# Patient Record
Sex: Female | Born: 1945
Health system: Southern US, Community
[De-identification: ages and names within clinical notes are randomized; demographics above are authoritative.]

## PROBLEM LIST (undated history)

## (undated) DIAGNOSIS — G4730 Sleep apnea, unspecified: Secondary | ICD-10-CM

## (undated) DIAGNOSIS — K2270 Barrett's esophagus without dysplasia: Secondary | ICD-10-CM

## (undated) DIAGNOSIS — E7800 Pure hypercholesterolemia, unspecified: Secondary | ICD-10-CM

## (undated) DIAGNOSIS — H269 Unspecified cataract: Secondary | ICD-10-CM

## (undated) DIAGNOSIS — F419 Anxiety disorder, unspecified: Secondary | ICD-10-CM

## (undated) DIAGNOSIS — K219 Gastro-esophageal reflux disease without esophagitis: Secondary | ICD-10-CM

## (undated) DIAGNOSIS — C50919 Malignant neoplasm of unspecified site of unspecified female breast: Secondary | ICD-10-CM

## (undated) DIAGNOSIS — E039 Hypothyroidism, unspecified: Secondary | ICD-10-CM

## (undated) DIAGNOSIS — R1310 Dysphagia, unspecified: Secondary | ICD-10-CM

## (undated) HISTORY — DX: Anxiety disorder, unspecified: F41.9

## (undated) HISTORY — PX: AUGMENTATION MAMMAPLASTY: SUR837

## (undated) HISTORY — DX: Pure hypercholesterolemia, unspecified: E78.00

## (undated) HISTORY — DX: Sleep apnea, unspecified: G47.30

## (undated) HISTORY — DX: Unspecified cataract: H26.9

## (undated) HISTORY — DX: Gastro-esophageal reflux disease without esophagitis: K21.9

## (undated) HISTORY — PX: BREAST SURGERY: SHX581

---

## 2004-06-13 ENCOUNTER — Ambulatory Visit: Payer: Self-pay | Admitting: Family Medicine

## 2005-06-19 ENCOUNTER — Ambulatory Visit: Payer: Self-pay | Admitting: Family Medicine

## 2006-02-24 ENCOUNTER — Ambulatory Visit: Payer: Self-pay | Admitting: General Surgery

## 2006-03-13 ENCOUNTER — Ambulatory Visit: Payer: Self-pay | Admitting: General Surgery

## 2006-07-14 ENCOUNTER — Ambulatory Visit: Payer: Self-pay

## 2007-07-20 ENCOUNTER — Ambulatory Visit: Payer: Self-pay | Admitting: Family Medicine

## 2008-07-20 ENCOUNTER — Ambulatory Visit: Payer: Self-pay | Admitting: Family Medicine

## 2009-07-23 ENCOUNTER — Ambulatory Visit: Payer: Self-pay | Admitting: Family Medicine

## 2009-08-10 ENCOUNTER — Ambulatory Visit: Payer: Self-pay | Admitting: Family Medicine

## 2009-08-28 ENCOUNTER — Ambulatory Visit: Payer: Self-pay | Admitting: General Surgery

## 2009-08-30 ENCOUNTER — Ambulatory Visit: Payer: Self-pay | Admitting: General Surgery

## 2009-09-01 HISTORY — PX: MASTECTOMY: SHX3

## 2009-09-01 HISTORY — PX: TONSILLECTOMY: SUR1361

## 2010-03-20 LAB — HM PAP SMEAR: HM PAP: NEGATIVE

## 2012-03-03 ENCOUNTER — Ambulatory Visit: Payer: Self-pay | Admitting: Ophthalmology

## 2012-04-01 ENCOUNTER — Other Ambulatory Visit: Payer: Self-pay | Admitting: Ophthalmology

## 2013-02-14 ENCOUNTER — Ambulatory Visit: Payer: Self-pay | Admitting: Family Medicine

## 2013-02-14 LAB — HM DEXA SCAN

## 2014-05-02 ENCOUNTER — Ambulatory Visit (INDEPENDENT_AMBULATORY_CARE_PROVIDER_SITE_OTHER): Payer: BC Managed Care – PPO | Admitting: Podiatry

## 2014-05-02 ENCOUNTER — Ambulatory Visit: Payer: Self-pay | Admitting: Podiatry

## 2014-05-02 ENCOUNTER — Encounter: Payer: Self-pay | Admitting: Podiatry

## 2014-05-02 ENCOUNTER — Ambulatory Visit (INDEPENDENT_AMBULATORY_CARE_PROVIDER_SITE_OTHER): Payer: BC Managed Care – PPO

## 2014-05-02 VITALS — BP 117/69 | HR 67 | Resp 16 | Ht 64.0 in | Wt 130.0 lb

## 2014-05-02 DIAGNOSIS — M722 Plantar fascial fibromatosis: Secondary | ICD-10-CM

## 2014-05-02 MED ORDER — TRIAMCINOLONE ACETONIDE 10 MG/ML IJ SUSP
10.0000 mg | Freq: Once | INTRAMUSCULAR | Status: AC
  Administered 2014-05-02: 10 mg

## 2014-05-02 NOTE — Progress Notes (Signed)
   Subjective:    Patient ID: Kristen Hays, female    DOB: 31-Mar-1946, 68 y.o.   MRN: [id number]  HPI Comments: Left plantar heel pain for about a month now, real hard to walk on it especially in the mornings. It has got worse it feels like icy hot that runs up the leg   Foot Pain      Review of Systems  All other systems reviewed and are negative.      Objective:   Physical Exam        Assessment & Plan:

## 2014-05-02 NOTE — Progress Notes (Signed)
Subjective:     Patient ID: Kristen Hays, female   DOB: 30-Dec-1945, 68 y.o.   MRN: [id number]  Foot Pain   patient states I'm having a lot of pain in my left heel for about a month and it's very difficult to walk on the mornings and it's been getting gradually worse and I also did some pain on the outside from walking differently   Review of Systems  All other systems reviewed and are negative.      Objective:   Physical Exam  Nursing note and vitals reviewed. Constitutional: She is oriented to person, place, and time.  Cardiovascular: Intact distal pulses.   Musculoskeletal: Normal range of motion.  Neurological: She is oriented to person, place, and time.  Skin: Skin is warm.   neurovascular status intact with muscle strength adequate and range of motion of the subtalar and midtarsal joint within normal limits. Patient's found to have moderate equinus is well oriented x3 and has good digital perfusion. There is significant discomfort in the plantar heel left at the insertion of the tendon into the calcaneus     Assessment:     Acute plantar fasciitis left heel at the insertion into the calcaneus    Plan:     H&P and x-ray reviewed. Injected the left plantar fashion 3 mg Kenalog 5 mg Xylocaine Marcaine mixture and dispensed fascial brace with instructions on usage. Placed on oral anti-inflammatory and reappoint her recheck in one week

## 2014-05-02 NOTE — Patient Instructions (Signed)

## 2014-05-09 ENCOUNTER — Ambulatory Visit (INDEPENDENT_AMBULATORY_CARE_PROVIDER_SITE_OTHER): Payer: BC Managed Care – PPO | Admitting: Podiatry

## 2014-05-09 VITALS — BP 131/70 | HR 65 | Resp 16

## 2014-05-09 DIAGNOSIS — M722 Plantar fascial fibromatosis: Secondary | ICD-10-CM

## 2014-05-09 MED ORDER — TRIAMCINOLONE ACETONIDE 10 MG/ML IJ SUSP: 10.0000 mg | Freq: Once | INTRAMUSCULAR | Status: DC

## 2014-05-09 NOTE — Progress Notes (Signed)
Subjective:     Patient ID: Kristen Hays, female   DOB: 30-Apr-1946, 68 y.o.   MRN: [id number]  HPI patient presents stating that I am feeling some better but still having pain in my heel   Review of Systems     Objective:   Physical Exam Neurovascular status intact with continued discomfort in the plantar heel that has improved some from previous visit    Assessment:     Continue plan her fasciitis left heel medial band    Plan:     Advised on physical therapy and the continuation of conservative care and reinjected the plantar fascia 3 mg Kenalog 5 mg Xylocaine and instructed on physical therapy

## 2014-08-01 HISTORY — PX: EYE SURGERY: SHX253

## 2014-08-07 ENCOUNTER — Ambulatory Visit: Payer: Self-pay | Admitting: Family Medicine

## 2014-08-14 ENCOUNTER — Ambulatory Visit: Payer: Self-pay | Admitting: Ophthalmology

## 2014-08-29 ENCOUNTER — Ambulatory Visit: Payer: Self-pay | Admitting: Ophthalmology

## 2014-09-01 DIAGNOSIS — K2270 Barrett's esophagus without dysplasia: Secondary | ICD-10-CM

## 2014-09-01 HISTORY — DX: Barrett's esophagus without dysplasia: K22.70

## 2014-10-24 ENCOUNTER — Ambulatory Visit: Payer: Self-pay | Admitting: Family Medicine

## 2014-11-06 ENCOUNTER — Ambulatory Visit: Payer: Self-pay | Admitting: Unknown Physician Specialty

## 2014-11-10 ENCOUNTER — Ambulatory Visit: Payer: Self-pay | Admitting: Unknown Physician Specialty

## 2014-12-15 ENCOUNTER — Other Ambulatory Visit: Payer: Self-pay | Admitting: Unknown Physician Specialty

## 2014-12-15 DIAGNOSIS — D440 Neoplasm of uncertain behavior of thyroid gland: Secondary | ICD-10-CM

## 2014-12-24 NOTE — Op Note (Signed)
PATIENT NAME:  Kristen Hays, Kristen Hays MR#:  [id number] DATE OF BIRTH:  September 26, 1945  DATE OF PROCEDURE:  03/03/2012  PROCEDURES PERFORMED:  1. Pars plana vitrectomy of the right eye.  2. Internal limiting membrane peel of the right eye.  3. Gas exchange of the right eye.  4. Endolaser, right eye.   PREOPERATIVE DIAGNOSES:  1. Epiretinal membrane of the right eye.  2. Retinal edema of the right eye.  3. Old retinal tear.  POSTOPERATIVE DIAGNOSES:  1. Epiretinal membrane of the right eye.  2. Macular hole right eye.  3. Retinal tear right eye.   SURGEON: Coralee Rud, M.D.   ANESTHESIA: Retrobulbar block of the right eye with monitored anesthesia care.   COMPLICATIONS: None.   INDICATIONS FOR PROCEDURE: This is a patient who presented to my office with slowly decreasing vision in the right eye. Examination revealed an epiretinal membrane and lamellar hole in the right eye. Examination also revealed two old retinal tears, status post laser. The risks, benefits, and alternatives of the above procedure were discussed and the patient wished to proceed.   DETAILS OF PROCEDURE: After informed consent was obtained, the patient was brought to the operative suite at [facility]. The patient was placed in the supine position, was given a small dose of alfentanil, and a retrobulbar block was performed on the right eye by the primary surgeon without complications. The right eye was prepped and draped in sterile manner. After a lid speculum was inserted, a 25-gauge trocar was placed inferotemporally through displaced conjunctiva in an oblique fashion 4 mm beyond the limbus. The infusion cannula was turned on and inserted through the trocar and secured in position with Steri-Strips. Two more trocars were placed in a similar fashion superotemporally and superonasally. The vitreous cutter and light pipe were introduced in the eye and a core vitrectomy was performed. During the course of  the vitrectomy, the vitreous face elevated off of two areas of the retina creating operculated holes. The peripheral vitreous was trimmed for 360 degrees. Endolaser was introduced and 3 to 4 rows of laser were placed around each of these holes. Indocyanine green was injected onto the posterior pole and then removed within 30 seconds. The magnification lens was placed onto the eye and examination revealed that the lamellar hole had developed a very small full thickness hole in the outer segments. The epiretinal membrane was peeled and the internal limiting membrane was peeled for 360 degrees around the tear for 2 to 2.5 disk diameters around the hole. Air-fluid exchange was performed. Scleral depressed exam was performed for 360 degrees and no further tears or breaks could be identified. All tears were noted to have at least four rows of laser around them. A complete air-fluid exchange was performed. Four minutes was allowed to elapse and then any remnant fluid from the hydrated? vitreous base was removed. 22% SF6 was used as an Acupuncturist and the trocars were removed. The pressure in the eye was confirmed to be approximately 15 mmHg and the wounds were airtight. 5 mg of dexamethasone was given into the inferior fornix and the lid speculum was removed. The eye was cleaned and TobraDex was placed on the eye. A patch and shield were placed over the eye and the patient was taken to postanesthesia care with instructions to remain face down.      ____________________________ Teresa Pelton. Starling Manns, MD mfa:bjt D: 03/03/2012 09:06:12 ET T: 03/03/2012 09:34:42 ET JOB#: [id number]  cc: Teresa Pelton.  Starling Manns, MD, <Dictator> Coralee Rud MD ELECTRONICALLY SIGNED 03/15/2012 17:41

## 2014-12-27 NOTE — Op Note (Signed)
PATIENT NAME:  Kristen Hays, Kristen Hays MR#:  [id number] DATE OF BIRTH:  October 11, 1945  DATE OF PROCEDURE:  08/29/2014  PREOPERATIVE DIAGNOSIS:  Nuclear sclerotic cataract of the right eye.   POSTOPERATIVE DIAGNOSIS:  Nuclear sclerotic cataract of the right eye.   OPERATIVE PROCEDURE:  Cataract extraction by phacoemulsification with implant of intraocular lens to right eye.   SURGEON:  Birder Robson, MD.   ANESTHESIA:  1. Managed anesthesia care.  2. Topical tetracaine drops followed by 2% Xylocaine jelly applied in the preoperative holding area.   COMPLICATIONS:  None.   TECHNIQUE:  Stop and chop.    DESCRIPTION OF PROCEDURE:  The patient was examined and consented in the preoperative holding area where the aforementioned topical anesthesia was applied to the right eye and then brought back to the Operating Room where the right eye was prepped and draped in the usual sterile ophthalmic fashion and a lid speculum was placed. A paracentesis was created with the side port blade and the anterior chamber was filled with viscoelastic. A near clear corneal incision was performed with the steel keratome. A continuous curvilinear capsulorrhexis was performed with a cystotome followed by the capsulorrhexis forceps. Hydrodissection and hydrodelineation were carried out with BSS on a blunt cannula. The lens was removed in a stop and chop technique and the remaining cortical material was removed with the irrigation-aspiration handpiece. The capsular bag was inflated with viscoelastic and the Tecnis ZCB00 20.5-diopter lens, serial number [id number] was placed in the capsular bag without complication. The remaining viscoelastic was removed from the eye with the irrigation-aspiration handpiece. The wounds were hydrated. The anterior chamber was flushed with Miostat and the eye was inflated to physiologic pressure. 0.1 mL of cefuroxime concentration 10 mg/mL was placed in the anterior chamber. The wounds were found to be  water tight. The eye was dressed with Vigamox. The patient was given protective glasses to wear throughout the day and a shield with which to sleep tonight. The patient was also given drops with which to begin a drop regimen today and will follow-up with me in one day.   Please note that VisionBlue dye was used to stain the anterior capsule as this was a nearly white cataract with a very poor red reflex.    ____________________________ Livingston Diones Shandrell Boda, MD wlp:bu D: 08/29/2014 13:56:48 ET T: 08/29/2014 14:58:37 ET JOB#: [id number]  cc: Maddalynn Barnard L. Shiara Mcgough, MD, <Dictator> Livingston Diones Yaminah Clayborn MD ELECTRONICALLY SIGNED 09/04/2014 9:19

## 2014-12-28 DIAGNOSIS — R140 Abdominal distension (gaseous): Secondary | ICD-10-CM | POA: Insufficient documentation

## 2015-01-01 ENCOUNTER — Other Ambulatory Visit: Payer: Self-pay | Admitting: Nurse Practitioner

## 2015-01-01 DIAGNOSIS — R1031 Right lower quadrant pain: Secondary | ICD-10-CM

## 2015-01-01 DIAGNOSIS — R140 Abdominal distension (gaseous): Secondary | ICD-10-CM

## 2015-01-01 DIAGNOSIS — R1032 Left lower quadrant pain: Secondary | ICD-10-CM

## 2015-01-05 ENCOUNTER — Ambulatory Visit: Payer: BLUE CROSS/BLUE SHIELD

## 2015-01-05 ENCOUNTER — Ambulatory Visit
Admission: RE | Admit: 2015-01-05 | Discharge: 2015-01-05 | Disposition: A | Discharge status: Home / Self Care | Payer: BLUE CROSS/BLUE SHIELD | Source: Ambulatory Visit | Attending: Nurse Practitioner | Admitting: Nurse Practitioner

## 2015-01-05 DIAGNOSIS — R1031 Right lower quadrant pain: Secondary | ICD-10-CM | POA: Diagnosis present

## 2015-01-05 DIAGNOSIS — R935 Abnormal findings on diagnostic imaging of other abdominal regions, including retroperitoneum: Secondary | ICD-10-CM | POA: Diagnosis not present

## 2015-01-05 DIAGNOSIS — R1032 Left lower quadrant pain: Secondary | ICD-10-CM

## 2015-01-05 DIAGNOSIS — R140 Abdominal distension (gaseous): Secondary | ICD-10-CM | POA: Diagnosis not present

## 2015-01-05 LAB — BASIC METABOLIC PANEL
BUN: 17 mg/dL (ref 4–21)
Creatinine: 0.7 mg/dL (ref 0.5–1.1)
Glucose: 92 mg/dL
POTASSIUM: 4.3 mmol/L (ref 3.4–5.3)
SODIUM: 142 mmol/L (ref 137–147)

## 2015-01-05 LAB — CBC AND DIFFERENTIAL
HEMATOCRIT: 39 % (ref 36–46)
Hemoglobin: 13.3 g/dL (ref 12.0–16.0)
Platelets: 249 10*3/uL (ref 150–399)
WBC: 5.5 10^3/mL

## 2015-01-05 LAB — LIPID PANEL
CHOLESTEROL: 153 mg/dL (ref 0–200)
HDL: 61 mg/dL (ref 35–70)
LDL CALC: 78 mg/dL
TRIGLYCERIDES: 70 mg/dL (ref 40–160)

## 2015-01-05 LAB — HEPATIC FUNCTION PANEL
ALT: 14 U/L (ref 7–35)
AST: 10 U/L — AB (ref 13–35)

## 2015-01-16 ENCOUNTER — Encounter: Payer: Self-pay | Admitting: *Deleted

## 2015-01-17 ENCOUNTER — Encounter: Payer: Self-pay | Admitting: *Deleted

## 2015-01-17 ENCOUNTER — Ambulatory Visit: Payer: BLUE CROSS/BLUE SHIELD | Admitting: Anesthesiology

## 2015-01-17 ENCOUNTER — Encounter
Admission: RE | Disposition: A | Discharge status: Home / Self Care | Payer: Self-pay | Source: Ambulatory Visit | Attending: Unknown Physician Specialty

## 2015-01-17 ENCOUNTER — Ambulatory Visit
Admission: RE | Admit: 2015-01-17 | Discharge: 2015-01-17 | Disposition: A | Discharge status: Home / Self Care | Payer: BLUE CROSS/BLUE SHIELD | Source: Ambulatory Visit | Attending: Unknown Physician Specialty | Admitting: Unknown Physician Specialty

## 2015-01-17 DIAGNOSIS — D123 Benign neoplasm of transverse colon: Secondary | ICD-10-CM | POA: Diagnosis not present

## 2015-01-17 DIAGNOSIS — R1310 Dysphagia, unspecified: Secondary | ICD-10-CM | POA: Insufficient documentation

## 2015-01-17 DIAGNOSIS — Z79899 Other long term (current) drug therapy: Secondary | ICD-10-CM | POA: Diagnosis not present

## 2015-01-17 DIAGNOSIS — D120 Benign neoplasm of cecum: Secondary | ICD-10-CM | POA: Diagnosis not present

## 2015-01-17 DIAGNOSIS — K5730 Diverticulosis of large intestine without perforation or abscess without bleeding: Secondary | ICD-10-CM | POA: Diagnosis not present

## 2015-01-17 DIAGNOSIS — E780 Pure hypercholesterolemia: Secondary | ICD-10-CM | POA: Diagnosis not present

## 2015-01-17 DIAGNOSIS — R1031 Right lower quadrant pain: Secondary | ICD-10-CM | POA: Insufficient documentation

## 2015-01-17 DIAGNOSIS — K640 First degree hemorrhoids: Secondary | ICD-10-CM | POA: Diagnosis not present

## 2015-01-17 DIAGNOSIS — R1032 Left lower quadrant pain: Secondary | ICD-10-CM | POA: Diagnosis not present

## 2015-01-17 DIAGNOSIS — D122 Benign neoplasm of ascending colon: Secondary | ICD-10-CM | POA: Diagnosis not present

## 2015-01-17 HISTORY — DX: Dysphagia, unspecified: R13.10

## 2015-01-17 HISTORY — PX: ESOPHAGOGASTRODUODENOSCOPY: SHX5428

## 2015-01-17 HISTORY — PX: COLONOSCOPY: SHX5424

## 2015-01-17 HISTORY — PX: SAVORY DILATION: SHX5439

## 2015-01-17 LAB — HM COLONOSCOPY

## 2015-01-17 SURGERY — COLONOSCOPY
Anesthesia: General

## 2015-01-17 MED ORDER — PROPOFOL 10 MG/ML IV BOLUS
INTRAVENOUS | Status: DC | PRN
  Administered 2015-01-17: 70 mg via INTRAVENOUS
  Administered 2015-01-17 (×2): 30 mg via INTRAVENOUS

## 2015-01-17 MED ORDER — SODIUM CHLORIDE 0.9 % IV SOLN: INTRAVENOUS | Status: DC

## 2015-01-17 MED ORDER — MIDAZOLAM HCL 5 MG/5ML IJ SOLN
INTRAMUSCULAR | Status: DC | PRN
  Administered 2015-01-17: 1 mg via INTRAVENOUS

## 2015-01-17 MED ORDER — LIDOCAINE HCL (CARDIAC) 20 MG/ML IV SOLN
INTRAVENOUS | Status: DC | PRN
  Administered 2015-01-17: 50 mg via INTRAVENOUS

## 2015-01-17 MED ORDER — PROPOFOL INFUSION 10 MG/ML OPTIME
INTRAVENOUS | Status: DC | PRN
Start: 2015-01-17 — End: 2015-01-17
  Administered 2015-01-17: 120 ug/kg/min via INTRAVENOUS

## 2015-01-17 MED ORDER — SODIUM CHLORIDE 0.9 % IV SOLN
INTRAVENOUS | Status: DC
  Administered 2015-01-17: 11:00:00 via INTRAVENOUS

## 2015-01-17 MED ORDER — GLYCOPYRROLATE 0.2 MG/ML IJ SOLN
INTRAMUSCULAR | Status: DC | PRN
  Administered 2015-01-17: 0.2 mg via INTRAVENOUS

## 2015-01-17 NOTE — Transfer of Care (Signed)
Immediate Anesthesia Transfer of Care Note  Patient: Kristen Hays  Procedure(s) Performed: Procedure(s): COLONOSCOPY (N/A) ESOPHAGOGASTRODUODENOSCOPY (EGD) (N/A) SAVORY DILATION (N/A)  Patient Location: PACU and Endoscopy Unit  Anesthesia Type:General  Level of Consciousness: awake and sedated  Airway & Oxygen Therapy: Patient Spontanous Breathing  Post-op Assessment: Report given to RN  Post vital signs: Reviewed and stable  Last Vitals:  Filed Vitals:   01/17/15 1210  BP: 123/72  Pulse: 81  Temp: 36.3 C  Resp: 17    Complications: No apparent anesthesia complications

## 2015-01-17 NOTE — Op Note (Signed)
[facility] Gastroenterology Patient Name: Kristen Hays Procedure Date: 01/17/2015 11:07 AM MRN: [id number] Account #: [id number] Date of Birth: Feb 03, 1946 Admit Type: Outpatient Age: 69 Room: [facility] ENDO ROOM 3 Gender: Female Note Status: Finalized Procedure:         Colonoscopy Indications:       Abdominal pain in the left lower quadrant, Abdominal pain                     in the right lower quadrant Providers:         Manya Silvas, MD Referring MD:      Jerrell Belfast, MD (Referring MD) Medicines:         Propofol per Anesthesia Complications:     No immediate complications. Procedure:         Pre-Anesthesia Assessment:                    - After reviewing the risks and benefits, the patient was                     deemed in satisfactory condition to undergo the procedure.                    After obtaining informed consent, the colonoscope was                     passed under direct vision. Throughout the procedure, the                     patient's blood pressure, pulse, and oxygen saturations                     were monitored continuously. The Colonoscope was                     introduced through the anus and advanced to the the cecum,                     identified by appendiceal orifice and ileocecal valve. The                     colonoscopy was performed without difficulty. The patient                     tolerated the procedure well. The quality of the bowel                     preparation was excellent. Findings:      A diminutive polyp was found in the cecum. The polyp was sessile. The       polyp was removed with a jumbo cold forceps. Resection and retrieval       were complete.      A small polyp was found in the ascending colon. The polyp was sessile.       The polyp was removed with a cold snare. Resection and retrieval were       complete.      A small polyp was found in the transverse colon. The polyp was sessile.       The polyp was  removed with a hot snare. Resection and retrieval were       complete. To prevent bleeding after the polypectomy, two hemostatic       clips were successfully placed. There was no bleeding  at the end of the       maneuver.      A few small-mouthed diverticula were found in the sigmoid colon.      Internal hemorrhoids were found during endoscopy. The hemorrhoids were       small and Grade I (internal hemorrhoids that do not prolapse). Impression:        - One diminutive polyp in the cecum. Resected and                     retrieved.                    - One small polyp in the ascending colon. Resected and                     retrieved.                    - One small polyp in the transverse colon. Resected and                     retrieved. Clips were placed.                    - Diverticulosis in the sigmoid colon.                    - Internal hemorrhoids. Recommendation:    - Await pathology results. Manya Silvas, MD 01/17/2015 12:11:10 PM This report has been signed electronically. Number of Addenda: 0 Note Initiated On: 01/17/2015 11:07 AM Scope Withdrawal Time: 0 hours 25 minutes 21 seconds  Total Procedure Duration: 0 hours 33 minutes 6 seconds       [facility]

## 2015-01-17 NOTE — Anesthesia Preprocedure Evaluation (Addendum)
Anesthesia Evaluation  Patient identified by MRN, date of birth, ID band Patient awake    Reviewed: Allergy & Precautions, NPO status   History of Anesthesia Complications Negative for: history of anesthetic complications  Airway Mallampati: II       Dental no notable dental hx.    Pulmonary neg pulmonary ROS,    Pulmonary exam normal       Cardiovascular negative cardio ROS Normal cardiovascular exam    Neuro/Psych negative neurological ROS     GI/Hepatic negative GI ROS, Neg liver ROS,   Endo/Other  negative endocrine ROS  Renal/GU negative Renal ROS  negative genitourinary   Musculoskeletal negative musculoskeletal ROS (+)   Abdominal Normal abdominal exam  (+)   Peds negative pediatric ROS (+)  Hematology negative hematology ROS (+)   Anesthesia Other Findings   Reproductive/Obstetrics negative OB ROS                            Anesthesia Physical Anesthesia Plan  ASA: I  Anesthesia Plan: General   Post-op Pain Management:    Induction: Intravenous  Airway Management Planned: Nasal Cannula  Additional Equipment:   Intra-op Plan:   Post-operative Plan:   Informed Consent: I have reviewed the patients History and Physical, chart, labs and discussed the procedure including the risks, benefits and alternatives for the proposed anesthesia with the patient or authorized representative who has indicated his/her understanding and acceptance.     Plan Discussed with: CRNA and Surgeon  Anesthesia Plan Comments:         Anesthesia Quick Evaluation

## 2015-01-17 NOTE — Op Note (Signed)
[facility] Gastroenterology Patient Name: Kristen Hays Procedure Date: 01/17/2015 11:06 AM MRN: [id number] Account #: [id number] Date of Birth: [birth year] Admit Type: Outpatient Age: 69 Room: [facility] ENDO ROOM 3 Gender: Female Note Status: Finalized Procedure:         Upper GI endoscopy Indications:       Dysphagia Providers:         Manya Silvas, MD Referring MD:      Jerrell Belfast, MD (Referring MD) Medicines:         Propofol per Anesthesia Complications:     No immediate complications. Procedure:         Pre-Anesthesia Assessment:                    - After reviewing the risks and benefits, the patient was                     deemed in satisfactory condition to undergo the procedure.                    After obtaining informed consent, the endoscope was passed                     under direct vision. Throughout the procedure, the                     patient's blood pressure, pulse, and oxygen saturations                     were monitored continuously. The Olympus GIF-160 endoscope                     (S#. [id number]) was introduced through the mouth, and                     advanced to the second part of duodenum. The upper GI                     endoscopy was accomplished without difficulty. The patient                     tolerated the procedure well. Findings:      There were esophageal mucosal changes suspicious for short-segment       Barrett's esophagus present in the lower third of the esophagus. The       maximum longitudinal extent of these mucosal changes was 2 cm in length.       Mucosa was biopsied with a cold forceps for histology. One specimen       bottle was sent to pathology. A guidewire was placed and the scope was       withdrawn. Dilation was performed with a Savary dilator with mild       resistance at 16 mm and 17 mm.      The stomach was normal.      The examined duodenum was normal. Impression:        - Esophageal mucosal changes  suspicious for short-segment                     Barrett's esophagus. Biopsied. Dilated.                    - Normal stomach.                    -  Normal examined duodenum. Recommendation:    - Await pathology results.                    - soft food for 3 days, eat slowly, chew well, take small                     bites Manya Silvas, MD 01/17/2015 11:31:15 AM This report has been signed electronically. Number of Addenda: 0 Note Initiated On: 01/17/2015 11:06 AM      [facility]

## 2015-01-17 NOTE — H&P (Signed)
   Primary Care Physician:  Margarita Rana, MD Primary Gastroenterologist:  Dr. Vira Agar  Pre-Procedure History & Physical: HPI:  Kristen Hays is a 69 y.o. female is here for an endoscopy and colonoscopy.   Past Medical History  Diagnosis Date  . High cholesterol   . Dysphagia     Past Surgical History  Procedure Laterality Date  . Breast surgery      Prior to Admission medications   Medication Sig Start Date End Date Taking? Authorizing Provider  atorvastatin (LIPITOR) 10 MG tablet Take 1 tablet by mouth daily.   Yes Historical Provider, MD  Cholecalciferol (VITAMIN D) 2000 UNITS CAPS Take 1 capsule by mouth daily.   Yes Historical Provider, MD  escitalopram (LEXAPRO) 10 MG tablet Take 1 tablet by mouth daily.   Yes Historical Provider, MD  omeprazole (PRILOSEC) 20 MG capsule Take 20 mg by mouth daily.   Yes Historical Provider, MD    Allergies as of 01/03/2015  . (No Known Allergies)    History reviewed. No pertinent family history.  History   Social History  . Marital Status: Married    Spouse Name: N/A  . Number of Children: N/A  . Years of Education: N/A   Occupational History  . Not on file.   Social History Main Topics  . Smoking status: Never Smoker   . Smokeless tobacco: Never Used  . Alcohol Use: Not on file  . Drug Use: Not on file  . Sexual Activity: Not on file   Other Topics Concern  . Not on file   Social History Narrative    Review of Systems: See HPI, otherwise negative ROS  Physical Exam: BP 126/72 mmHg  Pulse 64  Temp(Src) 97 F (36.1 C) (Tympanic)  Resp 16  Ht 5\' 4"  (1.626 m)  Wt 62.596 kg (138 lb)  BMI 23.68 kg/m2  SpO2 99% General:   Alert,  pleasant and cooperative in NAD Head:  Normocephalic and atraumatic. Neck:  Supple; no masses or thyromegaly. Lungs:  Clear throughout to auscultation.    Heart:  Regular rate and rhythm. Abdomen:  Soft, nontender and nondistended. Normal bowel sounds, without guarding, and without  rebound.   Neurologic:  Alert and  oriented x4;  grossly normal neurologically.  Impression/Plan: Kristen Hays is here for an endoscopy and colonoscopy to be performed for dysphagia, RLQ and LLQ abd pain  Risks, benefits, limitations, and alternatives regarding  endoscopy and colonoscopy have been reviewed with the patient.  Questions have been answered.  All parties agreeable.   Gaylyn Cheers, MD  01/17/2015, 11:09 AM

## 2015-01-17 NOTE — Anesthesia Postprocedure Evaluation (Signed)
  Anesthesia Post-op Note  Patient: Kristen Hays  Procedure(s) Performed: Procedure(s): COLONOSCOPY (N/A) ESOPHAGOGASTRODUODENOSCOPY (EGD) (N/A) SAVORY DILATION (N/A)  Anesthesia type:General  Patient location: PACU  Post pain: Pain level controlled  Post assessment: Post-op Vital signs reviewed, Patient's Cardiovascular Status Stable, Respiratory Function Stable, Patent Airway and No signs of Nausea or vomiting  Post vital signs: Reviewed and stable  Last Vitals:  Filed Vitals:   01/17/15 1210  BP: 123/72  Pulse: 81  Temp: 36.3 C  Resp: 17    Level of consciousness: awake, alert  and patient cooperative  Complications: No apparent anesthesia complications

## 2015-01-18 LAB — SURGICAL PATHOLOGY

## 2015-01-23 ENCOUNTER — Encounter: Payer: Self-pay | Admitting: Unknown Physician Specialty

## 2015-02-20 ENCOUNTER — Ambulatory Visit (INDEPENDENT_AMBULATORY_CARE_PROVIDER_SITE_OTHER): Payer: BLUE CROSS/BLUE SHIELD | Admitting: Podiatry

## 2015-02-20 ENCOUNTER — Encounter: Payer: Self-pay | Admitting: Podiatry

## 2015-02-20 ENCOUNTER — Ambulatory Visit (INDEPENDENT_AMBULATORY_CARE_PROVIDER_SITE_OTHER): Payer: BLUE CROSS/BLUE SHIELD

## 2015-02-20 VITALS — BP 118/70 | HR 74 | Resp 16 | Ht 64.0 in | Wt 134.0 lb

## 2015-02-20 DIAGNOSIS — M722 Plantar fascial fibromatosis: Secondary | ICD-10-CM | POA: Diagnosis not present

## 2015-02-20 NOTE — Patient Instructions (Signed)
Plantar Fasciitis (Heel Spur Syndrome) with Rehab The plantar fascia is a fibrous, ligament-like, soft-tissue structure that spans the bottom of the foot. Plantar fasciitis is a condition that causes pain in the foot due to inflammation of the tissue. SYMPTOMS   Pain and tenderness on the underneath side of the foot.  Pain that worsens with standing or walking. CAUSES  Plantar fasciitis is caused by irritation and injury to the plantar fascia on the underneath side of the foot. Common mechanisms of injury include:  Direct trauma to bottom of the foot.  Damage to a small nerve that runs under the foot where the main fascia attaches to the heel bone.  Stress placed on the plantar fascia due to bone spurs. RISK INCREASES WITH:   Activities that place stress on the plantar fascia (running, jumping, pivoting, or cutting).  Poor strength and flexibility.  Improperly fitted shoes.  Tight calf muscles.  Flat feet.  Failure to warm-up properly before activity.  Obesity. PREVENTION  Warm up and stretch properly before activity.  Allow for adequate recovery between workouts.  Maintain physical fitness:  Strength, flexibility, and endurance.  Cardiovascular fitness.  Maintain a health body weight.  Avoid stress on the plantar fascia.  Wear properly fitted shoes, including arch supports for individuals who have flat feet. PROGNOSIS  If treated properly, then the symptoms of plantar fasciitis usually resolve without surgery. However, occasionally surgery is necessary. RELATED COMPLICATIONS   Recurrent symptoms that may result in a chronic condition.  Problems of the lower back that are caused by compensating for the injury, such as limping.  Pain or weakness of the foot during push-off following surgery.  Chronic inflammation, scarring, and partial or complete fascia tear, occurring more often from repeated injections. TREATMENT  Treatment initially involves the use of  ice and medication to help reduce pain and inflammation. The use of strengthening and stretching exercises may help reduce pain with activity, especially stretches of the Achilles tendon. These exercises may be performed at home or with a therapist. Your caregiver may recommend that you use heel cups of arch supports to help reduce stress on the plantar fascia. Occasionally, corticosteroid injections are given to reduce inflammation. If symptoms persist for greater than 6 months despite non-surgical (conservative), then surgery may be recommended.  MEDICATION   If pain medication is necessary, then nonsteroidal anti-inflammatory medications, such as aspirin and ibuprofen, or other minor pain relievers, such as acetaminophen, are often recommended.  Do not take pain medication within 7 days before surgery.  Prescription pain relievers may be given if deemed necessary by your caregiver. Use only as directed and only as much as you need.  Corticosteroid injections may be given by your caregiver. These injections should be reserved for the most serious cases, because they may only be given a certain number of times. HEAT AND COLD  Cold treatment (icing) relieves pain and reduces inflammation. Cold treatment should be applied for 10 to 15 minutes every 2 to 3 hours for inflammation and pain and immediately after any activity that aggravates your symptoms. Use ice packs or massage the area with a piece of ice (ice massage).  Heat treatment may be used prior to performing the stretching and strengthening activities prescribed by your caregiver, physical therapist, or athletic trainer. Use a heat pack or soak the injury in warm water. SEEK IMMEDIATE MEDICAL CARE IF:  Treatment seems to offer no benefit, or the condition worsens.  Any medications produce adverse side effects. EXERCISES RANGE   OF MOTION (ROM) AND STRETCHING EXERCISES - Plantar Fasciitis (Heel Spur Syndrome) These exercises may help you  when beginning to rehabilitate your injury. Your symptoms may resolve with or without further involvement from your physician, physical therapist or athletic trainer. While completing these exercises, remember:   Restoring tissue flexibility helps normal motion to return to the joints. This allows healthier, less painful movement and activity.  An effective stretch should be held for at least 30 seconds.  A stretch should never be painful. You should only feel a gentle lengthening or release in the stretched tissue. RANGE OF MOTION - Toe Extension, Flexion  Sit with your right / left leg crossed over your opposite knee.  Grasp your toes and gently pull them back toward the top of your foot. You should feel a stretch on the bottom of your toes and/or foot.  Hold this stretch for __________ seconds.  Now, gently pull your toes toward the bottom of your foot. You should feel a stretch on the top of your toes and or foot.  Hold this stretch for __________ seconds. Repeat __________ times. Complete this stretch __________ times per day.  RANGE OF MOTION - Ankle Dorsiflexion, Active Assisted  Remove shoes and sit on a chair that is preferably not on a carpeted surface.  Place right / left foot under knee. Extend your opposite leg for support.  Keeping your heel down, slide your right / left foot back toward the chair until you feel a stretch at your ankle or calf. If you do not feel a stretch, slide your bottom forward to the edge of the chair, while still keeping your heel down.  Hold this stretch for __________ seconds. Repeat __________ times. Complete this stretch __________ times per day.  STRETCH - Gastroc, Standing  Place hands on wall.  Extend right / left leg, keeping the front knee somewhat bent.  Slightly point your toes inward on your back foot.  Keeping your right / left heel on the floor and your knee straight, shift your weight toward the wall, not allowing your back to  arch.  You should feel a gentle stretch in the right / left calf. Hold this position for __________ seconds. Repeat __________ times. Complete this stretch __________ times per day. STRETCH - Soleus, Standing  Place hands on wall.  Extend right / left leg, keeping the other knee somewhat bent.  Slightly point your toes inward on your back foot.  Keep your right / left heel on the floor, bend your back knee, and slightly shift your weight over the back leg so that you feel a gentle stretch deep in your back calf.  Hold this position for __________ seconds. Repeat __________ times. Complete this stretch __________ times per day. STRETCH - Gastrocsoleus, Standing  Note: This exercise can place a lot of stress on your foot and ankle. Please complete this exercise only if specifically instructed by your caregiver.   Place the ball of your right / left foot on a step, keeping your other foot firmly on the same step.  Hold on to the wall or a rail for balance.  Slowly lift your other foot, allowing your body weight to press your heel down over the edge of the step.  You should feel a stretch in your right / left calf.  Hold this position for __________ seconds.  Repeat this exercise with a slight bend in your right / left knee. Repeat __________ times. Complete this stretch __________ times per day.    STRENGTHENING EXERCISES - Plantar Fasciitis (Heel Spur Syndrome)  These exercises may help you when beginning to rehabilitate your injury. They may resolve your symptoms with or without further involvement from your physician, physical therapist or athletic trainer. While completing these exercises, remember:   Muscles can gain both the endurance and the strength needed for everyday activities through controlled exercises.  Complete these exercises as instructed by your physician, physical therapist or athletic trainer. Progress the resistance and repetitions only as guided. STRENGTH -  Towel Curls  Sit in a chair positioned on a non-carpeted surface.  Place your foot on a towel, keeping your heel on the floor.  Pull the towel toward your heel by only curling your toes. Keep your heel on the floor.  If instructed by your physician, physical therapist or athletic trainer, add ____________________ at the end of the towel. Repeat __________ times. Complete this exercise __________ times per day. STRENGTH - Ankle Inversion  Secure one end of a rubber exercise band/tubing to a fixed object (table, pole). Loop the other end around your foot just before your toes.  Place your fists between your knees. This will focus your strengthening at your ankle.  Slowly, pull your big toe up and in, making sure the band/tubing is positioned to resist the entire motion.  Hold this position for __________ seconds.  Have your muscles resist the band/tubing as it slowly pulls your foot back to the starting position. Repeat __________ times. Complete this exercises __________ times per day.  Document Released: 08/18/2005 Document Revised: 11/10/2011 Document Reviewed: 11/30/2008 [employer] Patient Information 2015 [employer]. This information is not intended to replace advice given to you by your health care provider. Make sure you discuss any questions you have with your health care provider.  

## 2015-02-21 MED ORDER — MELOXICAM 7.5 MG PO TABS
7.5000 mg | ORAL_TABLET | Freq: Every day | ORAL | Status: DC
Start: 2015-02-21 — End: 2015-03-27

## 2015-02-26 NOTE — Progress Notes (Addendum)
Patient ID: Kristen Hays, female   DOB: [birth year], 69 y.o.   MRN: [id number]  Subjective: 69 year old female presents the office complains of bilateral heel pain which has been ongoing for 1 month. She states the left is worse in the right. She states that she has pain in the morning which is relieved by ambulation however she does have a throbbing pain after being on her feet for period of time as well. She previously has tried stretching without much relief. She denies any tingling or numbness to the area and denies any swelling or redness. She denies any history of injury or trauma to the area or any change or increased activity time months of symptoms. No other complaints at this time in no acute changes since last appointment. She denies any systemic complaints such as fevers, chills, nausea, vomiting.  Objective: AAO x3, NAD DP/PT pulses palpable bilaterally, CRT less than 3 seconds Protective sensation intact with Simms Weinstein monofilament, vibratory sensation intact, Achilles tendon reflex intact Tenderness to palpation overlying the plantar medial tubercle of the calcaneus to bilateral heels at the insertion of the plantar fascia with the L>R. There is no pain along the course of plantar fascia within the arch of the foot. There is no pain with lateral compression of the calcaneus or pain the vibratory sensation. No pain on the posterior aspect of the calcaneus or along the course/insertion of the Achilles tendon. There is no overlying edema, erythema, increase in warmth. No other areas of tenderness palpation or pain with vibratory sensation to the foot/ankle. MMT 5/5, ROM WNL No open lesions or pre-ulcerative lesions are identified. No pain with calf compression, swelling, warmth, erythema.  Assessment: 69 year old female with bilateral heel pain left greater than right.  Plan: -X-rays were obtained and reviewed with the patient.  -She did not have a steroid injection at this  appointment. We did discuss this.  -Dispensed plantar fascial brace. -Ice the area -Stretching to be done on a consistent basis. -Discussed shoe gear modifications. Not to go barefoot even while at home. -Discussed orthotics -Prescribed mobic. Discussed side effects of the medication and directed to stop if any are to occur and call the office.  -Follow-up 3 weeks or sooner if any problems arise. In the meantime, encouraged to call the office with any questions, concerns, change in symptoms.   Celesta Gentile, DPM

## 2015-03-04 DIAGNOSIS — K2270 Barrett's esophagus without dysplasia: Secondary | ICD-10-CM | POA: Insufficient documentation

## 2015-03-04 DIAGNOSIS — Z860101 Personal history of adenomatous and serrated colon polyps: Secondary | ICD-10-CM | POA: Insufficient documentation

## 2015-03-04 DIAGNOSIS — Z86010 Personal history of colonic polyps: Secondary | ICD-10-CM | POA: Insufficient documentation

## 2015-03-13 ENCOUNTER — Ambulatory Visit (INDEPENDENT_AMBULATORY_CARE_PROVIDER_SITE_OTHER): Payer: BLUE CROSS/BLUE SHIELD | Admitting: Podiatry

## 2015-03-13 VITALS — BP 130/78 | HR 74 | Resp 16

## 2015-03-13 DIAGNOSIS — M722 Plantar fascial fibromatosis: Secondary | ICD-10-CM | POA: Diagnosis not present

## 2015-03-13 NOTE — Patient Instructions (Signed)
Plantar Fasciitis (Heel Spur Syndrome) with Rehab The plantar fascia is a fibrous, ligament-like, soft-tissue structure that spans the bottom of the foot. Plantar fasciitis is a condition that causes pain in the foot due to inflammation of the tissue. SYMPTOMS   Pain and tenderness on the underneath side of the foot.  Pain that worsens with standing or walking. CAUSES  Plantar fasciitis is caused by irritation and injury to the plantar fascia on the underneath side of the foot. Common mechanisms of injury include:  Direct trauma to bottom of the foot.  Damage to a small nerve that runs under the foot where the main fascia attaches to the heel bone.  Stress placed on the plantar fascia due to bone spurs. RISK INCREASES WITH:   Activities that place stress on the plantar fascia (running, jumping, pivoting, or cutting).  Poor strength and flexibility.  Improperly fitted shoes.  Tight calf muscles.  Flat feet.  Failure to warm-up properly before activity.  Obesity. PREVENTION  Warm up and stretch properly before activity.  Allow for adequate recovery between workouts.  Maintain physical fitness:  Strength, flexibility, and endurance.  Cardiovascular fitness.  Maintain a health body weight.  Avoid stress on the plantar fascia.  Wear properly fitted shoes, including arch supports for individuals who have flat feet. PROGNOSIS  If treated properly, then the symptoms of plantar fasciitis usually resolve without surgery. However, occasionally surgery is necessary. RELATED COMPLICATIONS   Recurrent symptoms that may result in a chronic condition.  Problems of the lower back that are caused by compensating for the injury, such as limping.  Pain or weakness of the foot during push-off following surgery.  Chronic inflammation, scarring, and partial or complete fascia tear, occurring more often from repeated injections. TREATMENT  Treatment initially involves the use of  ice and medication to help reduce pain and inflammation. The use of strengthening and stretching exercises may help reduce pain with activity, especially stretches of the Achilles tendon. These exercises may be performed at home or with a therapist. Your caregiver may recommend that you use heel cups of arch supports to help reduce stress on the plantar fascia. Occasionally, corticosteroid injections are given to reduce inflammation. If symptoms persist for greater than 6 months despite non-surgical (conservative), then surgery may be recommended.  MEDICATION   If pain medication is necessary, then nonsteroidal anti-inflammatory medications, such as aspirin and ibuprofen, or other minor pain relievers, such as acetaminophen, are often recommended.  Do not take pain medication within 7 days before surgery.  Prescription pain relievers may be given if deemed necessary by your caregiver. Use only as directed and only as much as you need.  Corticosteroid injections may be given by your caregiver. These injections should be reserved for the most serious cases, because they may only be given a certain number of times. HEAT AND COLD  Cold treatment (icing) relieves pain and reduces inflammation. Cold treatment should be applied for 10 to 15 minutes every 2 to 3 hours for inflammation and pain and immediately after any activity that aggravates your symptoms. Use ice packs or massage the area with a piece of ice (ice massage).  Heat treatment may be used prior to performing the stretching and strengthening activities prescribed by your caregiver, physical therapist, or athletic trainer. Use a heat pack or soak the injury in warm water. SEEK IMMEDIATE MEDICAL CARE IF:  Treatment seems to offer no benefit, or the condition worsens.  Any medications produce adverse side effects. EXERCISES RANGE   OF MOTION (ROM) AND STRETCHING EXERCISES - Plantar Fasciitis (Heel Spur Syndrome) These exercises may help you  when beginning to rehabilitate your injury. Your symptoms may resolve with or without further involvement from your physician, physical therapist or athletic trainer. While completing these exercises, remember:   Restoring tissue flexibility helps normal motion to return to the joints. This allows healthier, less painful movement and activity.  An effective stretch should be held for at least 30 seconds.  A stretch should never be painful. You should only feel a gentle lengthening or release in the stretched tissue. RANGE OF MOTION - Toe Extension, Flexion  Sit with your right / left leg crossed over your opposite knee.  Grasp your toes and gently pull them back toward the top of your foot. You should feel a stretch on the bottom of your toes and/or foot.  Hold this stretch for __________ seconds.  Now, gently pull your toes toward the bottom of your foot. You should feel a stretch on the top of your toes and or foot.  Hold this stretch for __________ seconds. Repeat __________ times. Complete this stretch __________ times per day.  RANGE OF MOTION - Ankle Dorsiflexion, Active Assisted  Remove shoes and sit on a chair that is preferably not on a carpeted surface.  Place right / left foot under knee. Extend your opposite leg for support.  Keeping your heel down, slide your right / left foot back toward the chair until you feel a stretch at your ankle or calf. If you do not feel a stretch, slide your bottom forward to the edge of the chair, while still keeping your heel down.  Hold this stretch for __________ seconds. Repeat __________ times. Complete this stretch __________ times per day.  STRETCH - Gastroc, Standing  Place hands on wall.  Extend right / left leg, keeping the front knee somewhat bent.  Slightly point your toes inward on your back foot.  Keeping your right / left heel on the floor and your knee straight, shift your weight toward the wall, not allowing your back to  arch.  You should feel a gentle stretch in the right / left calf. Hold this position for __________ seconds. Repeat __________ times. Complete this stretch __________ times per day. STRETCH - Soleus, Standing  Place hands on wall.  Extend right / left leg, keeping the other knee somewhat bent.  Slightly point your toes inward on your back foot.  Keep your right / left heel on the floor, bend your back knee, and slightly shift your weight over the back leg so that you feel a gentle stretch deep in your back calf.  Hold this position for __________ seconds. Repeat __________ times. Complete this stretch __________ times per day. STRETCH - Gastrocsoleus, Standing  Note: This exercise can place a lot of stress on your foot and ankle. Please complete this exercise only if specifically instructed by your caregiver.   Place the ball of your right / left foot on a step, keeping your other foot firmly on the same step.  Hold on to the wall or a rail for balance.  Slowly lift your other foot, allowing your body weight to press your heel down over the edge of the step.  You should feel a stretch in your right / left calf.  Hold this position for __________ seconds.  Repeat this exercise with a slight bend in your right / left knee. Repeat __________ times. Complete this stretch __________ times per day.    STRENGTHENING EXERCISES - Plantar Fasciitis (Heel Spur Syndrome)  These exercises may help you when beginning to rehabilitate your injury. They may resolve your symptoms with or without further involvement from your physician, physical therapist or athletic trainer. While completing these exercises, remember:   Muscles can gain both the endurance and the strength needed for everyday activities through controlled exercises.  Complete these exercises as instructed by your physician, physical therapist or athletic trainer. Progress the resistance and repetitions only as guided. STRENGTH -  Towel Curls  Sit in a chair positioned on a non-carpeted surface.  Place your foot on a towel, keeping your heel on the floor.  Pull the towel toward your heel by only curling your toes. Keep your heel on the floor.  If instructed by your physician, physical therapist or athletic trainer, add ____________________ at the end of the towel. Repeat __________ times. Complete this exercise __________ times per day. STRENGTH - Ankle Inversion  Secure one end of a rubber exercise band/tubing to a fixed object (table, pole). Loop the other end around your foot just before your toes.  Place your fists between your knees. This will focus your strengthening at your ankle.  Slowly, pull your big toe up and in, making sure the band/tubing is positioned to resist the entire motion.  Hold this position for __________ seconds.  Have your muscles resist the band/tubing as it slowly pulls your foot back to the starting position. Repeat __________ times. Complete this exercises __________ times per day.  Document Released: 08/18/2005 Document Revised: 11/10/2011 Document Reviewed: 11/30/2008 [employer] Patient Information 2015 [employer]. This information is not intended to replace advice given to you by your health care provider. Make sure you discuss any questions you have with your health care provider.  

## 2015-03-15 NOTE — Progress Notes (Signed)
Patient ID: Kristen Hays, female   DOB: 08-09-1946, 69 y.o.   MRN: [id number]  Subjective: 69 year old female presents the office today for follow-up evaluation of bilateral heel pain which has been ongoing for greater than one month. She states that her pain is about the same as what it was last appointment although she may have some minimal improvement. She is been continuing the stretching exercises intermittently however she has not been icing. She does with a plantar fascial braces intermittently. She presented multiple injections last year with Dr. Jerrilyn Cairo for the same issue. She denies any recent injury or trauma. Denies any swelling or redness. Denies any numbness or tingling. No other complaints at this time.  Objective: AAO x3, NAD DP/PT pulses palpable bilaterally, CRT less than 3 seconds Protective sensation intact with Simms Weinstein monofilament, vibratory sensation intact, Achilles tendon reflex intact Tenderness to palpation overlying the plantar medial tubercle of the calcaneus to bilateral heels at the insertion of the plantar fascia with the left > right. There is no pain along the course of plantar fascia within the arch of the foot. Plantar fascia appears intact. There is no pain with lateral compression of the calcaneus or pain the vibratory sensation. No pain on the posterior aspect of the calcaneus or along the course/insertion of the Achilles tendon. There is no overlying edema, erythema, increase in warmth. No other areas of tenderness palpation or pain with vibratory sensation to the foot/ankle. MMT 5/5, ROM WNL No open lesions or pre-ulcerative lesions are identified. No pain with calf compression, swelling, warmth, erythema.  Assessment: 69 year old female bilateral heel pain left greater than right without much relief of symptoms since last appointment  Plan: -Treatment options discussed including all alternatives, risks, and complications -At this time she's had  multiple steroid injections in the same area over the last year. We will hold off on another injection at this time. -Discussed orthotics. She is not yet tried this. She will purchase an over-the-counter pair. -Continue icing and stretching Achilles on a consistent basis in the morning and at night. -Discussed shoe gear modifications not to go barefoot even while at home. -Continue anti-inflammatories. Discussed side effects and directed to stop the arch or current call the office. -Discussed physical therapy. She wishes to hold off on that this time. -Continue plantar fascial brace -Follow-up 4 weeks or sooner if any problems arise. In the meantime, encouraged to call the office with any questions, concerns, change in symptoms.   Celesta Gentile, DPM

## 2015-03-27 ENCOUNTER — Other Ambulatory Visit: Payer: Self-pay | Admitting: Podiatry

## 2015-04-10 ENCOUNTER — Ambulatory Visit: Payer: BLUE CROSS/BLUE SHIELD | Admitting: Podiatry

## 2015-05-01 ENCOUNTER — Telehealth: Payer: Self-pay | Admitting: Family Medicine

## 2015-05-01 ENCOUNTER — Telehealth: Payer: Self-pay

## 2015-05-01 NOTE — Telephone Encounter (Signed)
Pt called requesting information on her abnormal ABI results from biometrics screening. Per Dr. Venia Minks, pt is to schedule OV to perform new ABI, since results can be questionable. Renaldo Fiddler, CMA

## 2015-05-14 ENCOUNTER — Telehealth: Payer: Self-pay | Admitting: Family Medicine

## 2015-05-14 NOTE — Telephone Encounter (Signed)
Ok to put in same day. Thanks.   

## 2015-05-14 NOTE — Telephone Encounter (Signed)
Pt states she has a place on her right leg that looks like a bug bite with brown in the middle and blisters around it.  Pt states she went to the [facility] on Sunday morning.  Pt is want Dr Venia Minks to look at this.  Pt has an appt on Friday but was hoping she could move that appt up to tomorrow after 3:00 if possible to see Dr Venia Minks for both.  Can I work pt in tomorrow?  CB#[phone] or [phone]

## 2015-05-15 ENCOUNTER — Encounter: Payer: Self-pay | Admitting: Family Medicine

## 2015-05-15 ENCOUNTER — Ambulatory Visit (INDEPENDENT_AMBULATORY_CARE_PROVIDER_SITE_OTHER): Payer: BLUE CROSS/BLUE SHIELD | Admitting: Family Medicine

## 2015-05-15 VITALS — BP 108/72 | HR 80 | Temp 98.1°F | Resp 16 | Wt 132.0 lb

## 2015-05-15 DIAGNOSIS — L0291 Cutaneous abscess, unspecified: Secondary | ICD-10-CM

## 2015-05-15 NOTE — Telephone Encounter (Signed)
Pt scheduled for 2:45 today/MW

## 2015-05-15 NOTE — Progress Notes (Signed)
Subjective:    Patient ID: Kristen Hays, female    DOB: [birth year], 69 y.o.   MRN: [id number]  HPI  Epidermal Cyst: Patient complains of a subcutaneous nodule located over the lower leg.  This has been present for 5 days.  There has been discharge, swelling and itching.  Patient does not have a history of epidermal inclusion cysts. Pt reports she went [facility] on Sunday. Pt reports that they sent a cx of the cyst, and placed pt on Bactrim. Pt reports that the blister "popped" this morning, but states that otherwise the lesion has not improved.  No fevers. Does not feel sick. Now a partial blister still. Not painful.  Has been keeping to covered and yellow drainage on it.    Review of Systems  Constitutional: Negative for fever, chills, diaphoresis, activity change, appetite change, fatigue and unexpected weight change.  Respiratory: Negative for cough, shortness of breath and wheezing.   Cardiovascular: Negative for chest pain, palpitations and leg swelling.  Skin:       Raised lesion is present.    BP 108/72 mmHg  Pulse 80  Temp(Src) 98.1 F (36.7 C) (Oral)  Resp 16  Wt 132 lb (59.875 kg)   Patient Active Problem List   Diagnosis Date Noted  . Barrett esophagus 03/04/2015  . H/O adenomatous polyp of colon 03/04/2015  . Abdominal bloating 12/28/2014   Past Medical History  Diagnosis Date  . High cholesterol   . Dysphagia    Current Outpatient Prescriptions on File Prior to Visit  Medication Sig  . atorvastatin (LIPITOR) 10 MG tablet Take 1 tablet by mouth daily.  . Cholecalciferol (VITAMIN D) 2000 UNITS CAPS Take 1 capsule by mouth daily.  Marland Kitchen omeprazole (PRILOSEC) 20 MG capsule Take 20 mg by mouth daily.   Current Facility-Administered Medications on File Prior to Visit  Medication  . triamcinolone acetonide (KENALOG) 10 MG/ML injection 10 mg   No Known Allergies Past Surgical History  Procedure Laterality Date  . Breast surgery    . Colonoscopy N/A  01/17/2015    Procedure: COLONOSCOPY;  Surgeon: Manya Silvas, MD;  Location: [facility] ENDOSCOPY;  Service: Endoscopy;  Laterality: N/A;  . Esophagogastroduodenoscopy N/A 01/17/2015    Procedure: ESOPHAGOGASTRODUODENOSCOPY (EGD);  Surgeon: Manya Silvas, MD;  Location: [facility] ENDOSCOPY;  Service: Endoscopy;  Laterality: N/A;  . Savory dilation N/A 01/17/2015    Procedure: SAVORY DILATION;  Surgeon: Manya Silvas, MD;  Location: [facility] ENDOSCOPY;  Service: Endoscopy;  Laterality: N/A;   Social History   Social History  . Marital Status: Divorced    Spouse Name: N/A  . Number of Children: N/A  . Years of Education: N/A   Occupational History  . Not on file.   Social History Main Topics  . Smoking status: Never Smoker   . Smokeless tobacco: Never Used  . Alcohol Use: No  . Drug Use: No  . Sexual Activity: Not on file   Other Topics Concern  . Not on file   Social History Narrative   Family History  Problem Relation Age of Onset  . Diabetes Mother   . Stroke Mother   . Alzheimer's disease Mother   . Bone cancer Father   . Vascular Disease Father        Objective:   Physical Exam  Constitutional: She appears well-developed and well-nourished.  Skin: Skin is warm.  3cm raised blister with collapsed, draining edge. Also with multiple small red firm papules  on top of foot.     BP 108/72 mmHg  Pulse 80  Temp(Src) 98.1 F (36.7 C) (Oral)  Resp 16  Wt 132 lb (59.875 kg)      Assessment & Plan:  1. Abscess Unclear if related to bite. Skin around blister looks healthy. No cellulitis. Unable to obtain culture from urgent care. Redone today to be on safe side. Will continue antibiotic and recheck Friday.   - Wound culture    Patient was seen and examined by Jerrell Belfast, MD, and note scribed,in part, by Renaldo Fiddler, CMA and reviewed patient. I have reviewed the document for accuracy and completeness and I agree with above. Jerrell Belfast, MD   Margarita Rana,  MD

## 2015-05-18 ENCOUNTER — Ambulatory Visit
Admission: RE | Admit: 2015-05-18 | Discharge: 2015-05-18 | Disposition: A | Discharge status: Home / Self Care | Payer: BLUE CROSS/BLUE SHIELD | Source: Ambulatory Visit | Attending: Unknown Physician Specialty | Admitting: Unknown Physician Specialty

## 2015-05-18 ENCOUNTER — Ambulatory Visit (INDEPENDENT_AMBULATORY_CARE_PROVIDER_SITE_OTHER): Payer: BLUE CROSS/BLUE SHIELD | Admitting: Family Medicine

## 2015-05-18 DIAGNOSIS — E785 Hyperlipidemia, unspecified: Secondary | ICD-10-CM | POA: Insufficient documentation

## 2015-05-18 DIAGNOSIS — I999 Unspecified disorder of circulatory system: Secondary | ICD-10-CM | POA: Diagnosis not present

## 2015-05-18 DIAGNOSIS — C50919 Malignant neoplasm of unspecified site of unspecified female breast: Secondary | ICD-10-CM | POA: Insufficient documentation

## 2015-05-18 DIAGNOSIS — E7800 Pure hypercholesterolemia, unspecified: Secondary | ICD-10-CM | POA: Insufficient documentation

## 2015-05-18 DIAGNOSIS — E041 Nontoxic single thyroid nodule: Secondary | ICD-10-CM | POA: Insufficient documentation

## 2015-05-18 DIAGNOSIS — M542 Cervicalgia: Secondary | ICD-10-CM | POA: Insufficient documentation

## 2015-05-18 DIAGNOSIS — D440 Neoplasm of uncertain behavior of thyroid gland: Secondary | ICD-10-CM

## 2015-05-18 DIAGNOSIS — K449 Diaphragmatic hernia without obstruction or gangrene: Secondary | ICD-10-CM | POA: Insufficient documentation

## 2015-05-18 DIAGNOSIS — F458 Other somatoform disorders: Secondary | ICD-10-CM | POA: Insufficient documentation

## 2015-05-18 DIAGNOSIS — M545 Low back pain, unspecified: Secondary | ICD-10-CM | POA: Insufficient documentation

## 2015-05-18 DIAGNOSIS — J392 Other diseases of pharynx: Secondary | ICD-10-CM | POA: Insufficient documentation

## 2015-05-18 DIAGNOSIS — R319 Hematuria, unspecified: Secondary | ICD-10-CM | POA: Insufficient documentation

## 2015-05-18 DIAGNOSIS — F419 Anxiety disorder, unspecified: Secondary | ICD-10-CM | POA: Insufficient documentation

## 2015-05-18 DIAGNOSIS — R09A2 Foreign body sensation, throat: Secondary | ICD-10-CM | POA: Insufficient documentation

## 2015-05-18 DIAGNOSIS — R591 Generalized enlarged lymph nodes: Secondary | ICD-10-CM | POA: Diagnosis not present

## 2015-05-18 LAB — WOUND CULTURE: Organism ID, Bacteria: NONE SEEN

## 2015-05-18 LAB — PLEASE NOTE

## 2015-05-20 NOTE — Progress Notes (Signed)
Normal ABI

## 2015-05-24 ENCOUNTER — Encounter: Payer: Self-pay | Admitting: Family Medicine

## 2015-06-13 ENCOUNTER — Encounter: Payer: Self-pay | Admitting: Family Medicine

## 2015-06-14 ENCOUNTER — Encounter: Payer: Self-pay | Admitting: Family Medicine

## 2015-06-21 ENCOUNTER — Ambulatory Visit (INDEPENDENT_AMBULATORY_CARE_PROVIDER_SITE_OTHER): Payer: BLUE CROSS/BLUE SHIELD | Admitting: Family Medicine

## 2015-06-21 ENCOUNTER — Encounter: Payer: Self-pay | Admitting: Family Medicine

## 2015-06-21 VITALS — BP 124/70 | HR 76 | Temp 97.9°F | Resp 16 | Wt 136.0 lb

## 2015-06-21 DIAGNOSIS — G5751 Tarsal tunnel syndrome, right lower limb: Secondary | ICD-10-CM | POA: Diagnosis not present

## 2015-06-21 MED ORDER — MELOXICAM 15 MG PO TABS: 15.0000 mg | ORAL_TABLET | Freq: Every day | ORAL | Status: DC

## 2015-06-21 NOTE — Progress Notes (Signed)
Subjective:    Patient ID: Kristen Hays, female    DOB: 1946/02/21, 69 y.o.   MRN: [id number]  Foot Pain The current episode started more than 1 month ago (pt was seen on 05/15/2015 for abscess, which was staph infection. Pt was put on Bactrim. Has had foot pain ever since). The problem occurs daily. The problem has been gradually worsening. Pertinent negatives include no abdominal pain, anorexia, arthralgias, change in bowel habit, chest pain, chills, congestion, coughing, diaphoresis, fatigue, fever, headaches, joint swelling, myalgias, nausea, neck pain, numbness or weakness. The symptoms are aggravated by standing and walking. She has tried NSAIDs for the symptoms. The treatment provided moderate relief.  Pt reports the pain is "burning and stinging". Pt has a H/O plantar fasciitis, though reports the pain is located the top of her right foot, and sometimes radiates to the Achille's Tendon.  Has changed her shoes recently, on the advice of her podiatrist.     Review of Systems  Constitutional: Negative for fever, chills, diaphoresis and fatigue.  HENT: Negative for congestion.   Respiratory: Negative for cough.   Cardiovascular: Negative for chest pain.  Gastrointestinal: Negative for nausea, abdominal pain, anorexia and change in bowel habit.  Musculoskeletal: Negative for myalgias, joint swelling, arthralgias and neck pain.  Neurological: Negative for weakness, numbness and headaches.     Patient Active Problem List   Diagnosis Date Noted  . Anxiety 05/18/2015  . Breast CA ([place]) 05/18/2015  . Globus hystericus 05/18/2015  . Blood in the urine 05/18/2015  . 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 HLD (hyperlipidemia) 05/18/2015  . LBP (low back pain) 05/18/2015  . 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 Cervical pain 05/18/2015  . Barrett esophagus 03/04/2015  . H/O adenomatous polyp of colon 03/04/2015  . Abdominal bloating 12/28/2014   Past Medical History  Diagnosis Date  . High  cholesterol   . Dysphagia   . Anxiety    Current Outpatient Prescriptions on File Prior to Visit  Medication Sig  . atorvastatin (LIPITOR) 10 MG tablet Take 1 tablet by mouth daily.  . Cholecalciferol (VITAMIN D) 2000 UNITS CAPS Take 1 capsule by mouth daily.  Marland Kitchen omeprazole (PRILOSEC) 20 MG capsule Take 20 mg by mouth daily.   Current Facility-Administered Medications on File Prior to Visit  Medication  . triamcinolone acetonide (KENALOG) 10 MG/ML injection 10 mg   No Known Allergies Past Surgical History  Procedure Laterality Date  . Colonoscopy N/A 01/17/2015    Procedure: COLONOSCOPY;  Surgeon: Manya Silvas, MD;  Location: [facility] ENDOSCOPY;  Service: Endoscopy;  Laterality: N/A;  . Esophagogastroduodenoscopy N/A 01/17/2015    Procedure: ESOPHAGOGASTRODUODENOSCOPY (EGD);  Surgeon: Manya Silvas, MD;  Location[facility] ENDOSCOPY;  Service: Endoscopy;  Laterality: N/A;  . Savory dilation N/A 01/17/2015    Procedure: SAVORY DILATION;  Surgeon: Manya Silvas, MD;  Location: [facility] ENDOSCOPY;  Service: Endoscopy;  Laterality: N/A;  . Tonsillectomy  2011  . Cesarean section      1974/1979  . Breast surgery      mastectomy-right; breast augmentation   Social History   Social History  . Marital Status: Divorced    Spouse Name: N/A  . Number of Children: 2  . Years of Education: N/A   Occupational History  . Not on file.   Social History Main Topics  . Smoking status: Never Smoker   . Smokeless tobacco: Never Used  . Alcohol Use: No  . Drug Use: No  . Sexual Activity: Not  on file   Other Topics Concern  . Not on file   Social History Narrative   Family History  Problem Relation Age of Onset  . Diabetes Mother   . Stroke Mother   . Alzheimer's disease Mother   . Bone cancer Father   . Vascular Disease Father   . Cancer Sister     breast  . Irritable bowel syndrome Sister       Objective:   Physical Exam  Constitutional: She is oriented to person, place, and  time. She appears well-developed and well-nourished.  Musculoskeletal: She exhibits tenderness.  At back of foot and on side. No edema or lesions.   Neurological: She is alert and oriented to person, place, and time.  Psychiatric: She has a normal mood and affect. Her behavior is normal. Judgment and thought content normal.   BP 124/70 mmHg  Pulse 76  Temp(Src) 97.9 F (36.6 C) (Oral)  Resp 16  Wt 136 lb (61.689 kg)     Assessment & Plan:  1. Tarsal tunnel syndrome of right side New problem. Will treat with anti-inflammatories and exercises. Follow up with podiatry as scheduled.   - meloxicam (MOBIC) 15 MG tablet; Take 1 tablet (15 mg total) by mouth daily.  Dispense: 30 tablet; Refill: 0  Margarita Rana, MD

## 2015-06-21 NOTE — Patient Instructions (Signed)
Tarsal Tunnel Syndrome With Rehab Tarsal tunnel syndrome is a condition that involves pressure (compression) on the nerve in the ankle (posterior tibial nerve) and results in pain and loss of feeling on the bottom of the foot. The nerve is usually compressed by other structures within the ankle. SYMPTOMS   Signs of nerve damage: pain, numbness, tingling, and loss of feeling along the bottom of the foot.  Pain that worsens with activity.  Feeling a lack of stability in the ankle. CAUSES  Tarsal tunnel syndrome is caused by structures within the ankle placing pressure on the nerve inside the ankle, which causes sensations in the bottom of the foot. Common sources of pressure include:  Ligament-like tissue (retinaculum) that covers the nerve area in the ankle (tarsal tunnel).  Bony spurs or bumps.  Inflamed tendons (tendonitis). RISK INCREASES WITH:  Stretched ankle ligaments, which create a loose joint.  Flat feet.  Arthritis of the ankle.  Inflammation of tendons in the foot and ankle.  Previous foot or ankle injury. PREVENTION  Warm up and stretch properly before activity.  Maintain physical fitness:  Strength, flexibility, and endurance.  Cardiovascular fitness (increases heart rate).  Wear properly fitted shoes.  Wear arch supports (orthotics), if you have flat feet.  Protect the ankle with taping, braces, or compression bandages. PROGNOSIS  If treated properly, the symptoms of tarsal tunnel syndrome usually go away with non-surgical treatment. Occasionally, surgery is necessary to free the compressed nerve.  RELATED COMPLICATIONS  Permanent nerve damage, including pain, numbness, tingling, or weakness in the ankle. TREATMENT Treatment first involves resting from any activities that aggravate the symptoms, as well as the use of ice and medicine to reduce pain and inflammation. The use of strengthening and stretching exercises may help reduce pain from activity. Other  treatments include wearing arch supports, if you have flat feet, and cross training (training in various physical activities) to reduce stress on the foot and ankle. If symptoms persist, despite non-surgical treatment, then surgery may be recommended. Surgery usually provides full relief from symptoms.  MEDICATION   If pain medicine is necessary, nonsteroidal anti-inflammatory medicines (aspirin and ibuprofen), or other minor pain relievers (acetaminophen), are often recommended.  Do not take pain medication for 7 days before surgery.  Prescription pain relievers may be given if your caregiver thinks they are necessary. Use only as directed and only as much as you need. HEAT AND COLD  Cold treatment (icing) relieves pain and reduces inflammation. Cold treatment should be applied for 10 to 15 minutes every 2 to 3 hours, and immediately after any activity that aggravates your symptoms. Use ice packs or an ice massage.  Heat treatment may be used prior to performing stretching and strengthening activities prescribed by your caregiver, physical therapist, or athletic trainer. Use a heat pack or a warm water soak. SEEK MEDICAL CARE IF:  Treatment does not seem to help, or the condition worsens.  Any medicines produce negative side effects.  Any complications from surgery occur:  Pain, numbness, or coldness in the affected foot.  Discoloration beneath the toenails (blue or gray) of the affected foot.  Signs of infections (fever, pain, inflammation, redness, or persistent bleeding). EXERCISES RANGE OF MOTION (ROM) AND STRETCHING EXERCISES - Tarsal Tunnel Syndrome (Posterior Tibial Nerve Compression) These exercises may help you when beginning to restore activity to your injured foot. Complete these exercises with caution. Nerves are very sensitive tissue. They must be exercised gently. Never force a motion and do not push through  discomfort. Notify your caregiver of any exercises which increase  your pain or worsen your symptoms. Your symptoms may go away with or without further involvement from your physician, physical therapist or athletic trainer. While completing these exercises, remember:   Restoring tissue flexibility helps normal motion to return to the joints. This allows healthier, less painful movement and activity.  An effective stretch should be held for at least 30 seconds.  A stretch should never be painful. You should only feel a gentle lengthening or release in the stretched tissue. STRETCH - Gastroc, Standing  Place hands on wall.  Extend right / left leg behind you and place a folded washcloth under the arch of your foot for support. Keep the front knee somewhat bent.  Slightly point your toes inward on your back foot.  Keeping your right / left heel on the floor and your knee straight, shift your weight toward the wall, not allowing your back to arch.  You should feel a gentle stretch in the right / left calf. Hold this position for __________ seconds. Repeat __________ times. Complete this stretch __________ times per day. STRETCH - Soleus, Standing  Place hands on wall.  Extend right / left leg behind you and place a folded washcloth under the arch of your foot for support. Keep the front knee somewhat bent.  Slightly point your toes inward on your back foot.  Keep your right / left heel on the floor, bend your back knee, and slightly shift your weight over the back leg so that you feel a gentle stretch deep in your back calf.  Hold this position for __________ seconds. Repeat __________ times. Complete this stretch __________ times per day. RANGE OF MOTION - Toe Extension, Flexion  Sit with your right / left leg crossed over your opposite knee.  Grasp your toes and gently pull them back toward the top of your foot. You should feel a stretch on the bottom of your toes and foot.  Hold this stretch for __________ seconds.  Now, gently pull your toes  toward the bottom of your foot. You should feel a stretch on the top of your toes and foot.  Hold this stretch for __________ seconds. Repeat __________ times. Complete this stretch __________ times per day.  RANGE OF MOTION - Ankle Eversion  Sit with your right / left ankle crossed over your opposite knee.  Grip your foot with your opposite hand, placing your thumb on the top of your foot and your fingers across the bottom of your foot.  Gently push your foot downward with a slight rotation so your littlest toes rise slightly toward the ceiling.  You should feel a gentle stretch on the inside of your ankle. Hold the stretch for __________ seconds. Repeat __________ times. Complete this exercise __________ times per day.  RANGE OF MOTION - Ankle Dorsiflexion, Active Assisted  Remove shoes and sit on a chair, preferably not on a carpeted surface.  Place right / left foot on the floor, directly under knee. Extend your opposite leg for support.  Keeping your heel down, slide your right / left foot back toward the chair until you feel a stretch at your ankle or calf. If you do not feel a stretch, slide your bottom forward to the edge of the chair while still keeping your heel down.  Hold this stretch for __________ seconds. Repeat __________ times. Complete this stretch __________ times per day.  STRETCH - Hamstrings, Supine  Lie on your back. Loop  a belt or towel over the ball of your right / left foot.  Straighten your right / left knee and slowly pull on the belt to raise your leg. Do not allow the right / left knee to bend. Keep your opposite leg flat on the floor.  Raise the leg until you feel a gentle stretch behind your right / left knee or thigh. Hold this position for __________ seconds. Repeat __________ times. Complete this stretch __________ times per day.  STRETCH - Hamstrings, Doorway  Lie on your back with your right / left leg extended and resting on the wall and the  opposite leg flat on the ground through the door. Initially, position your bottom farther away from the wall than the illustration shows.  Keep your right / left knee straight. If you feel a stretch behind your knee or thigh, hold this position for __________ seconds.  If you do not feel a stretch, scoot your bottom closer to the door and hold __________ seconds. Repeat __________ times. Complete this stretch __________ times per day.  STRETCH - Hamstrings, Standing  Stand or sit, and extend your right / left leg, placing your foot on a chair or foot stool  Keep a slight arch in your low back, and keep your hips straight forward.  Lead with your chest and lean forward at the waist, until you feel a gentle stretch in the back of your right / left knee or thigh. (When done correctly, this exercise requires leaning only a small distance.)  Hold this position for __________ seconds. Repeat __________ times. Complete this stretch __________ times per day. STRENGTHENING EXERCISES - Tarsal Tunnel Syndrome (Posterior Tibial Nerve Compression) These exercises may help you when beginning to restore activity to your injured foot. Your symptoms may go away with or without further involvement from your physician, physical therapist or athletic trainer. While completing these exercises, remember:   Muscles can gain both the endurance and the strength needed for everyday activities through controlled exercises.  Complete these exercises as instructed by your physician, physical therapist or athletic trainer. Increase the resistance and repetitions only as guided.  You may experience muscle soreness or fatigue, but the pain or discomfort you are trying to eliminate should never worsen during these exercises. If this pain does worsen, stop and make certain you are following the directions exactly. If the pain is still present after adjustments, discontinue the exercise until you can discuss the trouble with  your caregiver. STRENGTH - Dorsiflexors  Secure a rubber exercise band or tubing to a fixed object (table, pole) and loop the other end around your right / left foot.  Sit on the floor facing the fixed object. The band should be slightly tense when your foot is relaxed.  Slowly draw your foot back toward you, using your ankle and toes.  Hold this position for __________ seconds. Slowly release the tension in the band and return your foot to the starting position. Repeat __________ times. Complete this exercise __________ times per day.  STRENGTH - Plantar-flexors  Sit with your right / left leg extended. Holding onto both ends of a rubber exercise band or tubing, loop it around the ball of your foot. Keep a slight tension in the band.  Slowly push your toes away from you, pointing them downward.  Hold this position for __________ seconds. Return to the starting position slowly, controlling the tension in the band. Repeat __________ times. Complete this exercise __________ times per day.  STRENGTH -  Plantar-flexors, Standing  Stand with your feet shoulder width apart. Place your hands on a wall or table to steady yourself, using as little support as needed.  Keeping your weight evenly spread over the width of your feet, rise up on your toes.*  Hold this position for __________ seconds. Repeat __________ times. Complete this exercise __________ times per day.  *If this is too easy, shift your weight toward your right / left leg until you feel challenged. Ultimately, you may be asked to do this exercise while standing on your right / left foot only. STRENGTH - Towel Curls  Sit in a chair, on a non-carpeted surface.  Place your foot on a towel, keeping your heel on the floor.  Pull the towel toward your heel only by curling your toes. Keep your heel on the floor.  If instructed by your physician, physical therapist or athletic trainer, add ____________________ at the end of the  towel. Repeat __________ times. Complete this exercise __________ times per day. STRENGTH - Ankle Eversion  Secure one end of a rubber exercise band or tubing to a fixed object (table, pole). Loop the other end around your foot, just before your toes.  Place your fists between your knees. This will focus your strengthening at your ankle.  Drawing the band across your opposite foot, away from the pole, slowly pull your little toe out and up. Make sure the band is positioned to resist the entire motion.  Hold this position for __________ seconds.  Return to the starting position slowly, controlling the tension in the band. Repeat __________ times. Complete this exercise __________ times per day.  STRENGTH - Ankle Inversion  Secure one end of a rubber exercise band or tubing to a fixed object (table, pole). Loop the other end around your foot, just before your toes.  Place your fists between your knees. This will focus your strengthening at your ankle.  Slowly, pull your big toe up and in, making sure the band is positioned to resist the entire motion.  Hold this position for __________ seconds.  Return to the starting position slowly, controlling the tension in the band. Repeat __________ times. Complete this exercises __________ times per day.    This information is not intended to replace advice given to you by your health care provider. Make sure you discuss any questions you have with your health care provider.   Document Released: 08/18/2005 Document Revised: 01/02/2015 Document Reviewed: 11/30/2008 Elsevier Interactive Patient Education [employer].

## 2015-06-25 ENCOUNTER — Telehealth: Payer: Self-pay | Admitting: Family Medicine

## 2015-06-25 NOTE — Telephone Encounter (Signed)
Pt states Dr Venia Minks has ask her to call back with the info as to when she had her flu shot. Pt states she had her flu shot 06/06/2015 with her employer[employer]

## 2015-06-25 NOTE — Telephone Encounter (Signed)
See below, updated this info in patient;s chart already-aa

## 2015-06-25 NOTE — Telephone Encounter (Signed)
OK to put in patient's record. Thanks.

## 2015-08-05 ENCOUNTER — Other Ambulatory Visit: Payer: Self-pay | Admitting: Family Medicine

## 2015-08-05 DIAGNOSIS — E785 Hyperlipidemia, unspecified: Secondary | ICD-10-CM

## 2015-08-15 ENCOUNTER — Other Ambulatory Visit: Payer: Self-pay | Admitting: Podiatry

## 2015-08-15 DIAGNOSIS — M7671 Peroneal tendinitis, right leg: Secondary | ICD-10-CM

## 2015-08-30 ENCOUNTER — Ambulatory Visit
Admission: RE | Admit: 2015-08-30 | Discharge: 2015-08-30 | Disposition: A | Discharge status: Home / Self Care | Payer: BLUE CROSS/BLUE SHIELD | Source: Ambulatory Visit | Attending: Podiatry | Admitting: Podiatry

## 2015-08-30 DIAGNOSIS — S96811A Strain of other specified muscles and tendons at ankle and foot level, right foot, initial encounter: Secondary | ICD-10-CM | POA: Insufficient documentation

## 2015-08-30 DIAGNOSIS — M7671 Peroneal tendinitis, right leg: Secondary | ICD-10-CM | POA: Insufficient documentation

## 2015-08-30 DIAGNOSIS — M94271 Chondromalacia, right ankle and joints of right foot: Secondary | ICD-10-CM | POA: Insufficient documentation

## 2015-08-30 DIAGNOSIS — M25571 Pain in right ankle and joints of right foot: Secondary | ICD-10-CM | POA: Diagnosis present

## 2015-09-05 ENCOUNTER — Ambulatory Visit: Payer: Medicare Other

## 2015-10-15 ENCOUNTER — Other Ambulatory Visit: Payer: Self-pay | Admitting: Family Medicine

## 2015-10-15 MED ORDER — ESCITALOPRAM OXALATE 10 MG PO TABS: 10.0000 mg | ORAL_TABLET | Freq: Every day | ORAL | Status: DC

## 2015-10-15 NOTE — Telephone Encounter (Signed)
Pt advised.  Apt made for 10/29/2015  Thanks,   -Mickel Baas

## 2015-10-15 NOTE — Telephone Encounter (Signed)
Sent in rx. Needs ov in next 4 weeks. Thanks.

## 2015-10-15 NOTE — Telephone Encounter (Signed)
Pt states she was given something for anxiety and sleep in the past and is requesting a refill of that medication; however, she couldn't remember the name of the medications.  She scheduled an appointment just incase she needed to be seen for Wednesday, February 15th and would like a call back to know if she needs to come or if the medications can just be called in.

## 2015-10-15 NOTE — Telephone Encounter (Signed)
In [place] she was on Lexapro 10mg  2 everyday.    Thanks,   -Mickel Baas

## 2015-10-17 ENCOUNTER — Ambulatory Visit: Payer: BLUE CROSS/BLUE SHIELD | Admitting: Family Medicine

## 2015-10-29 ENCOUNTER — Encounter: Payer: Self-pay | Admitting: Family Medicine

## 2015-10-29 ENCOUNTER — Ambulatory Visit (INDEPENDENT_AMBULATORY_CARE_PROVIDER_SITE_OTHER): Payer: BLUE CROSS/BLUE SHIELD | Admitting: Family Medicine

## 2015-10-29 VITALS — BP 106/64 | HR 72 | Temp 98.0°F | Resp 16 | Ht 64.0 in | Wt 136.0 lb

## 2015-10-29 DIAGNOSIS — K2270 Barrett's esophagus without dysplasia: Secondary | ICD-10-CM | POA: Diagnosis not present

## 2015-10-29 DIAGNOSIS — F419 Anxiety disorder, unspecified: Secondary | ICD-10-CM

## 2015-10-29 DIAGNOSIS — G4700 Insomnia, unspecified: Secondary | ICD-10-CM | POA: Diagnosis not present

## 2015-10-29 NOTE — Progress Notes (Signed)
Subjective:    Patient ID: Kristen Hays, female    DOB: [birth year], 70 y.o.   MRN: [id number]  Anxiety Presents for follow-up (last ov 01/05/2015 no changes made at that time. Patient restarted Lexapro 10/15/2015) visit. Symptoms include decreased concentration, excessive worry, feeling of choking, hyperventilation, insomnia, nervous/anxious behavior, restlessness and shortness of breath. Patient reports no chest pain, confusion, depressed mood, irritability, muscle tension, nausea, obsessions, palpitations, panic or suicidal ideas. Symptoms occur occasionally. The most recent episode lasted 5 minutes. The severity of symptoms is moderate. The symptoms are aggravated by work stress. The quality of sleep is fair. Nighttime awakenings: several.   The treatment provided moderate relief. Compliance with prior treatments has been good. Compliance with medications is 76-100%.  Insomnia Primary symptoms: difficulty falling asleep, frequent awakening.  Episode onset: 2 weeks. The onset quality is gradual. The problem occurs intermittently. The problem is unchanged. The symptoms are aggravated by work stress. Past treatments include nothing. Typical bedtime:  8-10 P.M..  How long after going to bed to you fall asleep: over an hour.        Review of Systems  Constitutional: Negative for irritability.  Respiratory: Positive for shortness of breath.   Cardiovascular: Negative for chest pain and palpitations.  Gastrointestinal: Negative for nausea.  Psychiatric/Behavioral: Positive for decreased concentration. Negative for suicidal ideas and confusion. The patient is nervous/anxious and has insomnia.    No Known Allergies   Current Outpatient Prescriptions on File Prior to Visit  Medication Sig Dispense Refill  . atorvastatin (LIPITOR) 10 MG tablet TAKE 1 TABLET BY MOUTH AT BEDTIME 90 tablet 1  . Cholecalciferol (VITAMIN D) 2000 UNITS CAPS Take 1 capsule by mouth daily.    Marland Kitchen escitalopram (LEXAPRO)  10 MG tablet Take 1 tablet (10 mg total) by mouth daily. 1/2 a day for 1 week and then 1 a day. 30 tablet 5  . meloxicam (MOBIC) 15 MG tablet Take 1 tablet (15 mg total) by mouth daily. 30 tablet 0  . omeprazole (PRILOSEC) 20 MG capsule Take 20 mg by mouth daily.     No current facility-administered medications on file prior to visit.   Social History   Social History  . Marital Status: Divorced    Spouse Name: N/A  . Number of Children: 2  . Years of Education: N/A   Occupational History  . Not on file.   Social History Main Topics  . Smoking status: Never Smoker   . Smokeless tobacco: Never Used  . Alcohol Use: No  . Drug Use: No  . Sexual Activity: Not on file   Other Topics Concern  . Not on file   Social History Narrative     Patient Active Problem List   Diagnosis Date Noted  . Tarsal tunnel syndrome of right side 06/21/2015  . Anxiety 05/18/2015  . Breast CA ([place]) 05/18/2015  . Globus hystericus 05/18/2015  . Blood in the urine 05/18/2015  . 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 HLD (hyperlipidemia) 05/18/2015  . LBP (low back pain) 05/18/2015  . 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 Cervical pain 05/18/2015  . Malignant neoplasm of female breast ([place]) 05/18/2015  . Barrett esophagus 03/04/2015  . H/O adenomatous polyp of colon 03/04/2015  . Abdominal bloating 12/28/2014      Objective:   Physical Exam  Constitutional: She is oriented to person, place, and time. She appears well-developed and well-nourished.  Neurological: She is alert and oriented to person, place, and time.  Psychiatric: She  has a normal mood and affect. Her behavior is normal. Judgment and thought content normal.   BP 106/64 mmHg  Pulse 72  Temp(Src) 98 F (36.7 C) (Oral)  Resp 16  Ht 5\' 4"  (1.626 m)  Wt 136 lb (61.689 kg)  BMI 23.33 kg/m2      Assessment & Plan:  1. Anxiety Improved, but not at goal. Will call in 2 weeks if wants increase in dosing.   2. Barrett  esophagus Continue with GI.   3. Insomnia Consider increasing Lexapro or add Trazodone. Patient will call with plan.    Patient was seen and examined by Jerrell Belfast, MD, and note scribed by Lynford Humphrey, [place].   I have reviewed the document for accuracy and completeness and I agree with above. Jerrell Belfast, MD   Margarita Rana, MD

## 2015-11-09 ENCOUNTER — Telehealth: Payer: Self-pay

## 2015-11-09 NOTE — Telephone Encounter (Signed)
Patient is calling requesting an increase of Lexapro 10mg . She reports that initally it would help with her anxiety and help her sleep, but now its not working as well. Patient uses [facility] on [address].

## 2015-11-10 MED ORDER — ESCITALOPRAM OXALATE 10 MG PO TABS: 15.0000 mg | ORAL_TABLET | Freq: Every day | ORAL | Status: DC

## 2015-11-10 NOTE — Telephone Encounter (Signed)
Sent rx for 15 mg.  PLease notify patient. Patient instructed to call back if condition worsens or does not improve.   Thanks.

## 2015-11-12 NOTE — Telephone Encounter (Signed)
Left message advising the pt.   Thanks,   -Mickel Baas

## 2015-11-20 ENCOUNTER — Other Ambulatory Visit: Payer: Self-pay | Admitting: Unknown Physician Specialty

## 2015-11-20 DIAGNOSIS — E041 Nontoxic single thyroid nodule: Secondary | ICD-10-CM

## 2015-11-23 ENCOUNTER — Ambulatory Visit
Admission: RE | Admit: 2015-11-23 | Discharge: 2015-11-23 | Disposition: A | Discharge status: Home / Self Care | Payer: BLUE CROSS/BLUE SHIELD | Source: Ambulatory Visit | Attending: Unknown Physician Specialty | Admitting: Unknown Physician Specialty

## 2015-11-23 ENCOUNTER — Ambulatory Visit: Payer: BLUE CROSS/BLUE SHIELD

## 2015-11-23 DIAGNOSIS — E041 Nontoxic single thyroid nodule: Secondary | ICD-10-CM

## 2016-01-07 ENCOUNTER — Encounter: Payer: Self-pay | Admitting: Family Medicine

## 2016-01-07 ENCOUNTER — Ambulatory Visit (INDEPENDENT_AMBULATORY_CARE_PROVIDER_SITE_OTHER): Payer: BLUE CROSS/BLUE SHIELD | Admitting: Family Medicine

## 2016-01-07 ENCOUNTER — Other Ambulatory Visit: Payer: Self-pay | Admitting: *Deleted

## 2016-01-07 ENCOUNTER — Inpatient Hospital Stay
Admission: RE | Admit: 2016-01-07 | Discharge: 2016-01-07 | Disposition: A | Discharge status: Home / Self Care | Payer: Self-pay | Source: Ambulatory Visit | Attending: *Deleted | Admitting: *Deleted

## 2016-01-07 VITALS — BP 110/64 | HR 74 | Temp 97.9°F | Resp 16 | Ht 62.5 in | Wt 136.0 lb

## 2016-01-07 DIAGNOSIS — Z9289 Personal history of other medical treatment: Secondary | ICD-10-CM

## 2016-01-07 DIAGNOSIS — Z780 Asymptomatic menopausal state: Secondary | ICD-10-CM

## 2016-01-07 DIAGNOSIS — F419 Anxiety disorder, unspecified: Secondary | ICD-10-CM

## 2016-01-07 DIAGNOSIS — Z0000 Encounter for general adult medical examination without abnormal findings: Secondary | ICD-10-CM

## 2016-01-07 DIAGNOSIS — Z1231 Encounter for screening mammogram for malignant neoplasm of breast: Secondary | ICD-10-CM | POA: Diagnosis not present

## 2016-01-07 DIAGNOSIS — E785 Hyperlipidemia, unspecified: Secondary | ICD-10-CM

## 2016-01-07 DIAGNOSIS — Z23 Encounter for immunization: Secondary | ICD-10-CM

## 2016-01-07 DIAGNOSIS — Z124 Encounter for screening for malignant neoplasm of cervix: Secondary | ICD-10-CM

## 2016-01-07 NOTE — Progress Notes (Signed)
Patient ID: Kristen Hays, female   DOB: 03/20/1946, 70 y.o.   MRN: [id number]       Patient: Kristen Hays, Female    DOB: 05/10/1946, 70 y.o.   MRN: [id number] Visit Date: 01/07/2016  Today's Provider: Margarita Rana, MD   Chief Complaint  Patient presents with  . Annual Exam   Subjective:    Annual physical exam Kristen Hays is a 70 y.o. female who presents today for health maintenance and complete physical. She feels well. She reports exercising 2 days a week. She reports she is sleeping well. 01/05/15 CPE 03/20/10 Pap-neg; HPV-neg 11/23/14 Mammogram-BI-RADS 2 02/14/13 BMD-osteopenia -----------------------------------------------------------------  Patient c/o of trouble breathing feels like "something is cutting her breath". Patient denies shortness of breath. Patient has not been able to relate her symptoms to   Review of Systems  Constitutional: Negative.   HENT: Negative.   Eyes: Negative.   Respiratory: Negative.   Cardiovascular: Negative.   Gastrointestinal: Positive for abdominal distention.  Endocrine: Negative.   Genitourinary: Negative.   Musculoskeletal: Positive for back pain.  Skin: Negative.   Allergic/Immunologic: Negative.   Neurological: Negative.   Hematological: Negative.   Psychiatric/Behavioral: Negative.     Social History      She  reports that she has never smoked. She has never used smokeless tobacco. She reports that she does not drink alcohol or use illicit drugs.       Social History   Social History  . Marital Status: Divorced    Spouse Name: N/A  . Number of Children: 2  . Years of Education: N/A   Social History Main Topics  . Smoking status: Never Smoker   . Smokeless tobacco: Never Used  . Alcohol Use: No  . Drug Use: No  . Sexual Activity: Not Asked   Other Topics Concern  . None   Social History Narrative    Past Medical History  Diagnosis Date  . High cholesterol   . Dysphagia   . Anxiety       Patient Active Problem List   Diagnosis Date Noted  . Tarsal tunnel syndrome of right side 06/21/2015  . Anxiety 05/18/2015  . Breast CA ([place]) 05/18/2015  . Globus hystericus 05/18/2015  . Blood in the urine 05/18/2015  . 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 HLD (hyperlipidemia) 05/18/2015  . LBP (low back pain) 05/18/2015  . 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 Cervical pain 05/18/2015  . Malignant neoplasm of female breast ([place]) 05/18/2015  . Barrett esophagus 03/04/2015  . H/O adenomatous polyp of colon 03/04/2015  . Abdominal bloating 12/28/2014    Past Surgical History  Procedure Laterality Date  . Colonoscopy N/A 01/17/2015    Procedure: COLONOSCOPY;  Surgeon: Manya Silvas, MD;  Location: [facility] ENDOSCOPY;  Service: Endoscopy;  Laterality: N/A;  . Esophagogastroduodenoscopy N/A 01/17/2015    Procedure: ESOPHAGOGASTRODUODENOSCOPY (EGD);  Surgeon: Manya Silvas, MD;  Location: [facility] ENDOSCOPY;  Service: Endoscopy;  Laterality: N/A;  . Savory dilation N/A 01/17/2015    Procedure: SAVORY DILATION;  Surgeon: Manya Silvas, MD;  Location: [facility] ENDOSCOPY;  Service: Endoscopy;  Laterality: N/A;  . Tonsillectomy  2011  . Cesarean section      1974/1979  . Breast surgery      mastectomy-right; breast augmentation    Family History        Family Status  Relation Status Death Age  . Mother Deceased   . Father Deceased   . Sister Alive   .  Sister Alive   . Brother Alive   . Sister Alive         Her family history includes Alzheimer's disease in her mother; Bone cancer in her father; Cancer in her sister; Diabetes in her mother and sister; Irritable bowel syndrome in her sister; Stroke in her mother; Vascular Disease in her father.    No Known Allergies  Previous Medications   ATORVASTATIN (LIPITOR) 10 MG TABLET    TAKE 1 TABLET BY MOUTH AT BEDTIME   CHOLECALCIFEROL (VITAMIN D) 2000 UNITS CAPS    Take 1 capsule by mouth daily.   ESCITALOPRAM (LEXAPRO) 10 MG TABLET     Take 1.5 tablets (15 mg total) by mouth daily. 1/2 a day for 1 week and then 1 a day.   MELOXICAM (MOBIC) 15 MG TABLET    Take 1 tablet (15 mg total) by mouth daily.   OMEPRAZOLE (PRILOSEC) 20 MG CAPSULE    Take 20 mg by mouth daily.    Patient Care Team: Margarita Rana, MD as PCP - General (Family Medicine)     Objective:   Vitals: BP 110/64 mmHg  Pulse 74  Temp(Src) 97.9 F (36.6 C) (Oral)  Resp 16  Ht 5' 2.5" (1.588 m)  Wt 136 lb (61.689 kg)  BMI 24.46 kg/m2   Physical Exam  Constitutional: She is oriented to person, place, and time. She appears well-developed and well-nourished.  HENT:  Head: Normocephalic and atraumatic.  Right Ear: Tympanic membrane, external ear and ear canal normal.  Left Ear: Tympanic membrane, external ear and ear canal normal.  Nose: Nose normal.  Mouth/Throat: Uvula is midline, oropharynx is clear and moist and mucous membranes are normal.  Eyes: Conjunctivae, EOM and lids are normal. Pupils are equal, round, and reactive to light.  Neck: Trachea normal and normal range of motion. Neck supple. Carotid bruit is not present. No thyroid mass and no thyromegaly present.  Cardiovascular: Normal rate, regular rhythm and normal heart sounds.   Pulmonary/Chest: Effort normal and breath sounds normal.  Bilateral implants and no nipple on right s/p mastectomy.  Abdominal: Soft. Normal appearance and bowel sounds are normal. There is no hepatosplenomegaly. There is no tenderness.  Genitourinary: Vagina normal. No breast swelling, tenderness or discharge.  Musculoskeletal: Normal range of motion.  Lymphadenopathy:    She has no cervical adenopathy.    She has no axillary adenopathy.  Neurological: She is alert and oriented to person, place, and time. She has normal strength. No cranial nerve deficit.  Skin: Skin is warm, dry and intact.  Psychiatric: She has a normal mood and affect. Her speech is normal and behavior is normal. Judgment and thought content  normal. Cognition and memory are normal.    Depression Screen PHQ 2/9 Scores 01/07/2016  PHQ - 2 Score 0    Assessment & Plan:     Routine Health Maintenance and Physical Exam  Exercise Activities and Dietary recommendations Goals    None      Immunization History  Administered Date(s) Administered  . Influenza, High Dose Seasonal PF 06/06/2015  . Pneumococcal Conjugate-13 12/09/2013  . Pneumococcal Polysaccharide-23 01/07/2016  . Zoster 04/08/2013      1. Annual physical exam Stable. Patient advised to continue eating healthy and exercise daily.  2. Cervical cancer screening F/U pending lab report. - Pap IG and HPV (high risk) DNA detection  3. HLD (hyperlipidemia) - CBC with Differential/Platelet - Comprehensive metabolic panel - Lipid Panel With LDL/HDL Ratio  4. Anxiety -  TSH  5. Need for pneumococcal vaccination - Pneumococcal polysaccharide vaccine 23-valent greater than or equal to 2yo subcutaneous/IM  6. Encounter for screening mammogram for breast cancer - MM DIGITAL SCREENING BILATERAL; Future  7. Postmenopausal - DG Bone Density; Future   Patient seen and examined by Dr. Jerrell Belfast, and note scribed by Philbert Riser. Dimas, CMA. I have reviewed the document for accuracy and completeness and I agree with above. Jerrell Belfast, MD   Margarita Rana, MD    --------------------------------------------------------------------

## 2016-01-08 ENCOUNTER — Telehealth: Payer: Self-pay

## 2016-01-08 LAB — COMPREHENSIVE METABOLIC PANEL
A/G RATIO: 2 (ref 1.2–2.2)
ALK PHOS: 54 IU/L (ref 39–117)
ALT: 20 IU/L (ref 0–32)
AST: 14 IU/L (ref 0–40)
Albumin: 4.5 g/dL (ref 3.6–4.8)
BUN / CREAT RATIO: 30 — AB (ref 12–28)
BUN: 20 mg/dL (ref 8–27)
Bilirubin Total: 0.6 mg/dL (ref 0.0–1.2)
CALCIUM: 9.3 mg/dL (ref 8.7–10.3)
CO2: 29 mmol/L (ref 18–29)
Chloride: 99 mmol/L (ref 96–106)
Creatinine, Ser: 0.66 mg/dL (ref 0.57–1.00)
GFR calc Af Amer: 105 mL/min/{1.73_m2} (ref >59)
GFR, EST NON AFRICAN AMERICAN: 91 mL/min/{1.73_m2} (ref >59)
Globulin, Total: 2.2 g/dL (ref 1.5–4.5)
Glucose: 88 mg/dL (ref 65–99)
Potassium: 4.7 mmol/L (ref 3.5–5.2)
SODIUM: 140 mmol/L (ref 134–144)
Total Protein: 6.7 g/dL (ref 6.0–8.5)

## 2016-01-08 LAB — CBC WITH DIFFERENTIAL/PLATELET
BASOS ABS: 0 10*3/uL (ref 0.0–0.2)
Basos: 1 %
EOS (ABSOLUTE): 0.1 10*3/uL (ref 0.0–0.4)
Eos: 1 %
Hematocrit: 44 % (ref 34.0–46.6)
Hemoglobin: 14.6 g/dL (ref 11.1–15.9)
Immature Grans (Abs): 0 10*3/uL (ref 0.0–0.1)
Immature Granulocytes: 0 %
LYMPHS ABS: 1.7 10*3/uL (ref 0.7–3.1)
Lymphs: 28 %
MCH: 29.5 pg (ref 26.6–33.0)
MCHC: 33.2 g/dL (ref 31.5–35.7)
MCV: 89 fL (ref 79–97)
MONOCYTES: 8 %
MONOS ABS: 0.5 10*3/uL (ref 0.1–0.9)
Neutrophils Absolute: 3.8 10*3/uL (ref 1.4–7.0)
Neutrophils: 62 %
PLATELETS: 260 10*3/uL (ref 150–379)
RBC: 4.95 x10E6/uL (ref 3.77–5.28)
RDW: 14.7 % (ref 12.3–15.4)
WBC: 6.1 10*3/uL (ref 3.4–10.8)

## 2016-01-08 LAB — LIPID PANEL WITH LDL/HDL RATIO
Cholesterol, Total: 162 mg/dL (ref 100–199)
HDL: 59 mg/dL (ref >39)
LDL CALC: 84 mg/dL (ref 0–99)
LDL/HDL RATIO: 1.4 ratio (ref 0.0–3.2)
TRIGLYCERIDES: 95 mg/dL (ref 0–149)
VLDL Cholesterol Cal: 19 mg/dL (ref 5–40)

## 2016-01-08 LAB — TSH: TSH: 0.623 u[IU]/mL (ref 0.450–4.500)

## 2016-01-08 NOTE — Telephone Encounter (Signed)
Left message to call back  

## 2016-01-08 NOTE — Telephone Encounter (Signed)
Advised pt of lab results. Pt verbally acknowledges understanding. Kristen Hays, CMA   

## 2016-01-08 NOTE — Telephone Encounter (Signed)
-----   Message from Margarita Rana, MD sent at 01/08/2016  7:07 AM EDT ----- Labs stable. Please notify patient. Thanks.

## 2016-01-09 LAB — PAP IG AND HPV HIGH-RISK
HPV, high-risk: NEGATIVE
PAP SMEAR COMMENT: 0

## 2016-01-10 ENCOUNTER — Telehealth: Payer: Self-pay

## 2016-01-10 NOTE — Telephone Encounter (Signed)
LMTCB 01/10/2016  Thanks,   -Mickel Baas

## 2016-01-10 NOTE — Telephone Encounter (Signed)
-----   Message from Margarita Rana, MD sent at 01/09/2016  4:44 PM EDT ----- Pap is normal. Please notify patient.   Thanks.

## 2016-01-11 NOTE — Telephone Encounter (Signed)
Pt advised.   Thanks,   -Dwayne Bulkley  

## 2016-01-16 ENCOUNTER — Ambulatory Visit: Payer: Medicare Other

## 2016-01-23 ENCOUNTER — Ambulatory Visit
Admission: RE | Admit: 2016-01-23 | Discharge: 2016-01-23 | Disposition: A | Discharge status: Home / Self Care | Payer: BLUE CROSS/BLUE SHIELD | Source: Ambulatory Visit | Attending: Family Medicine | Admitting: Family Medicine

## 2016-01-23 ENCOUNTER — Other Ambulatory Visit: Payer: Self-pay | Admitting: Family Medicine

## 2016-01-23 DIAGNOSIS — Z780 Asymptomatic menopausal state: Secondary | ICD-10-CM | POA: Diagnosis present

## 2016-01-23 DIAGNOSIS — M85851 Other specified disorders of bone density and structure, right thigh: Secondary | ICD-10-CM | POA: Diagnosis not present

## 2016-01-23 DIAGNOSIS — Z1231 Encounter for screening mammogram for malignant neoplasm of breast: Secondary | ICD-10-CM | POA: Diagnosis present

## 2016-01-23 DIAGNOSIS — M8588 Other specified disorders of bone density and structure, other site: Secondary | ICD-10-CM | POA: Diagnosis not present

## 2016-01-23 HISTORY — DX: Malignant neoplasm of unspecified site of unspecified female breast: C50.919

## 2016-01-24 ENCOUNTER — Telehealth: Payer: Self-pay

## 2016-01-24 NOTE — Telephone Encounter (Signed)
-----   Message from Margarita Rana, MD sent at 01/24/2016 11:38 AM EDT ----- Bone thinning, not osteoporosis. Recheck in 2 years. Weight bearing exercise. Thanks.

## 2016-01-24 NOTE — Telephone Encounter (Signed)
Left message to call back  

## 2016-01-25 ENCOUNTER — Telehealth: Payer: Self-pay | Admitting: Family Medicine

## 2016-01-25 NOTE — Telephone Encounter (Signed)
Pt returned call.Pt can be reached on cell phone

## 2016-01-25 NOTE — Telephone Encounter (Signed)
Pt advised.   Thanks,   -Laura  

## 2016-01-30 ENCOUNTER — Other Ambulatory Visit: Payer: Self-pay | Admitting: Family Medicine

## 2016-01-30 DIAGNOSIS — E785 Hyperlipidemia, unspecified: Secondary | ICD-10-CM

## 2016-02-26 ENCOUNTER — Encounter: Payer: Self-pay | Admitting: Family Medicine

## 2016-02-26 ENCOUNTER — Ambulatory Visit (INDEPENDENT_AMBULATORY_CARE_PROVIDER_SITE_OTHER): Payer: BLUE CROSS/BLUE SHIELD | Admitting: Family Medicine

## 2016-02-26 VITALS — BP 100/60 | HR 76 | Temp 97.8°F | Resp 16 | Wt 136.0 lb

## 2016-02-26 DIAGNOSIS — L309 Dermatitis, unspecified: Secondary | ICD-10-CM

## 2016-02-26 MED ORDER — MOMETASONE FUROATE 0.1 % EX CREA: 1.0000 "application " | TOPICAL_CREAM | Freq: Every day | CUTANEOUS | Status: DC

## 2016-02-26 NOTE — Progress Notes (Signed)
       Patient: Kristen Hays Female    DOB: 11-15-1945   70 y.o.   MRN: [id number] Visit Date: 02/26/2016  Today's Provider: Margarita Rana, MD   Chief Complaint  Patient presents with  . Rash   Subjective:    HPI    Rash: Patient complains of rash involving the back. Rash started 3 1/2 weeks ago. Appearance of rash at onset: Color of lesion(s): red. Rash has not changed over time. Initial distribution: back.  Discomfort associated with rash: is pruritic.  Associated symptoms: none. Denies: abdominal pain, arthralgia, congestion, cough, crankiness, decrease in appetite, decrease in energy level, fever, headache, irritability, myalgia, nausea, sore throat and vomiting. Patient has not had previous evaluation of rash. Patient has had previous treatment. Pt has tried OTC cortisol cream.  Response to treatment: No relief. Patient has not had contacts with similar rash. Patient has not identified precipitant. Patient has not had new exposures (soaps, lotions, laundry detergents, foods, medications, plants, insects or animals.)   No Known Allergies Current Meds  Medication Sig  . atorvastatin (LIPITOR) 10 MG tablet TAKE 1 TABLET BY MOUTH AT BEDTIME  . Cholecalciferol (VITAMIN D) 2000 UNITS CAPS Take 1 capsule by mouth daily.  Marland Kitchen escitalopram (LEXAPRO) 10 MG tablet Take 1.5 tablets (15 mg total) by mouth daily. 1/2 a day for 1 week and then 1 a day.  Marland Kitchen omeprazole (PRILOSEC) 20 MG capsule Take 20 mg by mouth daily.    Review of Systems  Constitutional: Negative for fever, chills, diaphoresis, activity change, appetite change, fatigue and unexpected weight change.  Respiratory: Negative for cough, shortness of breath and wheezing.   Cardiovascular: Negative for chest pain, palpitations and leg swelling.  Skin: Positive for rash.    Social History  Substance Use Topics  . Smoking status: Never Smoker   . Smokeless tobacco: Never Used  . Alcohol Use: No   Objective:   BP 100/60 mmHg   Pulse 76  Temp(Src) 97.8 F (36.6 C) (Oral)  Resp 16  Wt 136 lb (61.689 kg)  Physical Exam  Constitutional: She is oriented to person, place, and time. She appears well-developed and well-nourished.  Neurological: She is alert and oriented to person, place, and time.  Skin:  5 x 7 area of hyperpigmentation and erythema on right side of back.  Psychiatric: She has a normal mood and affect. Her behavior is normal.      Assessment & Plan:     1. Dermatitis New problem. Start Elocon cream as below. Pt to call office for possible dermatology referral if does not improve. - mometasone (ELOCON) 0.1 % cream; Apply 1 application topically daily.  Dispense: 45 g; Refill: 0     Patient seen and examined by Jerrell Belfast, MD, and note scribed by Renaldo Fiddler, CMA.  I have reviewed the document for accuracy and completeness and I agree with above. - Jerrell Belfast, MD   Margarita Rana, MD  [facility]

## 2016-05-03 ENCOUNTER — Other Ambulatory Visit: Payer: Self-pay | Admitting: Family Medicine

## 2016-07-18 NOTE — Discharge Instructions (Signed)
Cataract Surgery, Care After °Refer to this sheet in the next few weeks. These instructions provide you with information about caring for yourself after your procedure. Your health care provider may also give you more specific instructions. Your treatment has been planned according to current medical practices, but problems sometimes occur. Call your health care provider if you have any problems or questions after your procedure. °What can I expect after the procedure? °After the procedure, it is common to have: °· Itching. °· Discomfort. °· Fluid discharge. °· Sensitivity to light and to touch. °· Bruising. °Follow these instructions at home: °Eye Care  °· Check your eye every day for signs of infection. Watch for: °¨ Redness, swelling, or pain. °¨ Fluid, blood, or pus. °¨ Warmth. °¨ Bad smell. °Activity  °· Avoid strenuous activities, such as playing contact sports, for as long as told by your health care provider. °· Do not drive or operate heavy machinery until your health care provider approves. °· Do not bend or lift heavy objects . Bending increases pressure in the eye. You can walk, climb stairs, and do light household chores. °· Ask your health care provider when you can return to work. If you work in a dusty environment, you may be advised to wear protective eyewear for a period of time. °General instructions  °· Take or apply over-the-counter and prescription medicines only as told by your health care provider. This includes eye drops. °· Do not touch or rub your eyes. °· If you were given a protective shield, wear it as told by your health care provider. If you were not given a protective shield, wear sunglasses as told by your health care provider to protect your eyes. °· Keep the area around your eye clean and dry. Avoid swimming or allowing water to hit you directly in the face while showering until told by your health care provider. Keep soap and shampoo out of your eyes. °· Do not put a contact lens  into the affected eye or eyes until your health care provider approves. °· Keep all follow-up visits as told by your health care provider. This is important. °Contact a health care provider if: ° °· You have increased bruising around your eye. °· You have pain that is not helped with medicine. °· You have a fever. °· You have redness, swelling, or pain in your eye. °· You have fluid, blood, or pus coming from your incision. °· Your vision gets worse. °Get help right away if: °· You have sudden vision loss. °This information is not intended to replace advice given to you by your health care provider. Make sure you discuss any questions you have with your health care provider. °Document Released: 03/07/2005 Document Revised: 12/27/2015 Document Reviewed: 06/28/2015 °Elsevier Interactive Patient Education © 2017 [employer] ° ° ° ° °General Anesthesia, Adult, Care After °These instructions provide you with information about caring for yourself after your procedure. Your health care provider may also give you more specific instructions. Your treatment has been planned according to current medical practices, but problems sometimes occur. Call your health care provider if you have any problems or questions after your procedure. °What can I expect after the procedure? °After the procedure, it is common to have: °· Vomiting. °· A sore throat. °· Mental slowness. °It is common to feel: °· Nauseous. °· Cold or shivery. °· Sleepy. °· Tired. °· Sore or achy, even in parts of your body where you did not have surgery. °Follow these instructions at   home: °For at least 24 hours after the procedure:  °· Do not: °¨ Participate in activities where you could fall or become injured. °¨ Drive. °¨ Use heavy machinery. °¨ Drink alcohol. °¨ Take sleeping pills or medicines that cause drowsiness. °¨ Make important decisions or sign legal documents. °¨ Take care of children on your own. °· Rest. °Eating and drinking  °· If you vomit, drink  water, juice, or soup when you can drink without vomiting. °· Drink enough fluid to keep your urine clear or pale yellow. °· Make sure you have little or no nausea before eating solid foods. °· Follow the diet recommended by your health care provider. °General instructions  °· Have a responsible adult stay with you until you are awake and alert. °· Return to your normal activities as told by your health care provider. Ask your health care provider what activities are safe for you. °· Take over-the-counter and prescription medicines only as told by your health care provider. °· If you smoke, do not smoke without supervision. °· Keep all follow-up visits as told by your health care provider. This is important. °Contact a health care provider if: °· You continue to have nausea or vomiting at home, and medicines are not helpful. °· You cannot drink fluids or start eating again. °· You cannot urinate after 8-12 hours. °· You develop a skin rash. °· You have fever. °· You have increasing redness at the site of your procedure. °Get help right away if: °· You have difficulty breathing. °· You have chest pain. °· You have unexpected bleeding. °· You feel that you are having a life-threatening or urgent problem. °This information is not intended to replace advice given to you by your health care provider. Make sure you discuss any questions you have with your health care provider. °Document Released: 11/24/2000 Document Revised: 01/21/2016 Document Reviewed: 08/02/2015 °Elsevier Interactive Patient Education © 2017 Else[employer] Inc[employer] ° °

## 2016-07-21 ENCOUNTER — Ambulatory Visit
Admission: RE | Admit: 2016-07-21 | Discharge: 2016-07-21 | Disposition: A | Discharge status: Home / Self Care | Payer: BLUE CROSS/BLUE SHIELD | Source: Ambulatory Visit | Attending: Ophthalmology | Admitting: Ophthalmology

## 2016-07-21 ENCOUNTER — Ambulatory Visit: Payer: BLUE CROSS/BLUE SHIELD | Admitting: Anesthesiology

## 2016-07-21 ENCOUNTER — Encounter
Admission: RE | Disposition: A | Discharge status: Home / Self Care | Payer: Self-pay | Source: Ambulatory Visit | Attending: Ophthalmology

## 2016-07-21 DIAGNOSIS — F419 Anxiety disorder, unspecified: Secondary | ICD-10-CM | POA: Diagnosis not present

## 2016-07-21 DIAGNOSIS — E7800 Pure hypercholesterolemia, unspecified: Secondary | ICD-10-CM | POA: Diagnosis not present

## 2016-07-21 DIAGNOSIS — Z853 Personal history of malignant neoplasm of breast: Secondary | ICD-10-CM | POA: Diagnosis not present

## 2016-07-21 DIAGNOSIS — Z9011 Acquired absence of right breast and nipple: Secondary | ICD-10-CM | POA: Insufficient documentation

## 2016-07-21 DIAGNOSIS — K219 Gastro-esophageal reflux disease without esophagitis: Secondary | ICD-10-CM | POA: Insufficient documentation

## 2016-07-21 DIAGNOSIS — K2270 Barrett's esophagus without dysplasia: Secondary | ICD-10-CM | POA: Diagnosis not present

## 2016-07-21 DIAGNOSIS — H2512 Age-related nuclear cataract, left eye: Secondary | ICD-10-CM | POA: Insufficient documentation

## 2016-07-21 DIAGNOSIS — K449 Diaphragmatic hernia without obstruction or gangrene: Secondary | ICD-10-CM | POA: Insufficient documentation

## 2016-07-21 HISTORY — PX: CATARACT EXTRACTION W/PHACO: SHX586

## 2016-07-21 SURGERY — PHACOEMULSIFICATION, CATARACT, WITH IOL INSERTION
Anesthesia: Monitor Anesthesia Care | Site: Eye | Laterality: Left | Wound class: Clean

## 2016-07-21 MED ORDER — CEFUROXIME OPHTHALMIC INJECTION 1 MG/0.1 ML
INJECTION | OPHTHALMIC | Status: DC | PRN
  Administered 2016-07-21: 0.1 mL via OPHTHALMIC

## 2016-07-21 MED ORDER — ACETAMINOPHEN 325 MG PO TABS: 325.0000 mg | ORAL_TABLET | ORAL | Status: DC | PRN

## 2016-07-21 MED ORDER — FENTANYL CITRATE (PF) 100 MCG/2ML IJ SOLN
INTRAMUSCULAR | Status: DC | PRN
  Administered 2016-07-21: 50 ug via INTRAVENOUS

## 2016-07-21 MED ORDER — EPINEPHRINE PF 1 MG/ML IJ SOLN
INTRAMUSCULAR | Status: DC | PRN
  Administered 2016-07-21: 88 mL via OPHTHALMIC

## 2016-07-21 MED ORDER — ACETAMINOPHEN 160 MG/5ML PO SOLN: 325.0000 mg | ORAL | Status: DC | PRN

## 2016-07-21 MED ORDER — MOXIFLOXACIN HCL 0.5 % OP SOLN
1.0000 [drp] | OPHTHALMIC | Status: DC | PRN
  Administered 2016-07-21 (×3): 1 [drp] via OPHTHALMIC

## 2016-07-21 MED ORDER — ARMC OPHTHALMIC DILATING DROPS
1.0000 "application " | OPHTHALMIC | Status: DC | PRN
  Administered 2016-07-21 (×3): 1 via OPHTHALMIC

## 2016-07-21 MED ORDER — LACTATED RINGERS IV SOLN: INTRAVENOUS | Status: DC

## 2016-07-21 MED ORDER — BRIMONIDINE TARTRATE 0.2 % OP SOLN
OPHTHALMIC | Status: DC | PRN
  Administered 2016-07-21: 1 [drp] via OPHTHALMIC

## 2016-07-21 MED ORDER — LIDOCAINE HCL (PF) 4 % IJ SOLN
INTRAOCULAR | Status: DC | PRN
  Administered 2016-07-21: .5 mL via OPHTHALMIC

## 2016-07-21 MED ORDER — TIMOLOL MALEATE 0.5 % OP SOLN
OPHTHALMIC | Status: DC | PRN
  Administered 2016-07-21: 1 [drp] via OPHTHALMIC

## 2016-07-21 MED ORDER — MIDAZOLAM HCL 2 MG/2ML IJ SOLN
INTRAMUSCULAR | Status: DC | PRN
  Administered 2016-07-21: 2 mg via INTRAVENOUS

## 2016-07-21 MED ORDER — NA HYALUR & NA CHOND-NA HYALUR 0.4-0.35 ML IO KIT
PACK | INTRAOCULAR | Status: DC | PRN
  Administered 2016-07-21: 1 mL via INTRAOCULAR

## 2016-07-21 SURGICAL SUPPLY — 28 items
APPLICATOR COTTON TIP 3IN (MISCELLANEOUS) ×3 IMPLANT
CANNULA ANT/CHMB 27GA (MISCELLANEOUS) ×3 IMPLANT
DISSECTOR HYDRO NUCLEUS 50X22 (MISCELLANEOUS) ×3 IMPLANT
GLOVE BIO SURGEON STRL SZ7 (GLOVE) ×3 IMPLANT
GLOVE SURG LX 6.5 MICRO (GLOVE) ×2
GLOVE SURG LX STRL 6.5 MICRO (GLOVE) ×1 IMPLANT
GOWN STRL REUS W/ TWL LRG LVL3 (GOWN DISPOSABLE) ×2 IMPLANT
GOWN STRL REUS W/TWL LRG LVL3 (GOWN DISPOSABLE) ×4
LENS IOL ACRYSOF IQ 21.0 (Intraocular Lens) ×3 IMPLANT
MARKER SKIN DUAL TIP RULER LAB (MISCELLANEOUS) ×3 IMPLANT
NEEDLE FILTER BLUNT 18X 1/2SAF (NEEDLE) ×4
NEEDLE FILTER BLUNT 18X1 1/2 (NEEDLE) ×2 IMPLANT
PACK CATARACT BRASINGTON (MISCELLANEOUS) ×3 IMPLANT
PACK EYE AFTER SURG (MISCELLANEOUS) ×3 IMPLANT
PACK OPTHALMIC (MISCELLANEOUS) ×3 IMPLANT
RING MALYGIN 7.0 (MISCELLANEOUS) ×3 IMPLANT
SOL BAL SALT 15ML (MISCELLANEOUS)
SOLUTION BAL SALT 15ML (MISCELLANEOUS) IMPLANT
SUT ETHILON 10-0 CS-B-6CS-B-6 (SUTURE)
SUT VICRYL  9 0 (SUTURE)
SUT VICRYL 9 0 (SUTURE) IMPLANT
SUTURE EHLN 10-0 CS-B-6CS-B-6 (SUTURE) IMPLANT
SYR 3ML LL SCALE MARK (SYRINGE) ×6 IMPLANT
SYR TB 1ML LUER SLIP (SYRINGE) ×3 IMPLANT
WATER STERILE IRR 250ML POUR (IV SOLUTION) ×3 IMPLANT
WATER STERILE IRR 500ML POUR (IV SOLUTION) IMPLANT
WICK EYE OCUCEL (MISCELLANEOUS) IMPLANT
WIPE NON LINTING 3.25X3.25 (MISCELLANEOUS) ×3 IMPLANT

## 2016-07-21 NOTE — Anesthesia Postprocedure Evaluation (Signed)
Anesthesia Post Note  Patient: Kristen Hays  Procedure(s) Performed: Procedure(s) (LRB): CATARACT EXTRACTION PHACO AND INTRAOCULAR LENS PLACEMENT (IOC) (Left)  Patient location during evaluation: PACU Anesthesia Type: MAC Level of consciousness: awake and alert Pain management: pain level controlled Vital Signs Assessment: post-procedure vital signs reviewed and stable Respiratory status: spontaneous breathing, nonlabored ventilation, respiratory function stable and patient connected to nasal cannula oxygen Cardiovascular status: stable and blood pressure returned to baseline Anesthetic complications: no    Trecia Rogers

## 2016-07-21 NOTE — H&P (Signed)
H+P reviewed and is up to date, please see paper chart.  

## 2016-07-21 NOTE — Anesthesia Procedure Notes (Signed)
Procedure Name: MAC Performed by: Rexanna Louthan Pre-anesthesia Checklist: Patient identified, Emergency Drugs available, Suction available, Timeout performed and Patient being monitored Patient Re-evaluated:Patient Re-evaluated prior to inductionOxygen Delivery Method: Nasal cannula Placement Confirmation: positive ETCO2       

## 2016-07-21 NOTE — Transfer of Care (Signed)
Immediate Anesthesia Transfer of Care Note  Patient: Kristen Hays  Procedure(s) Performed: Procedure(s) with comments: CATARACT EXTRACTION PHACO AND INTRAOCULAR LENS PLACEMENT (IOC) (Left) - LEFT  Patient Location: PACU  Anesthesia Type: MAC  Level of Consciousness: awake, alert  and patient cooperative  Airway and Oxygen Therapy: Patient Spontanous Breathing and Patient connected to supplemental oxygen  Post-op Assessment: Post-op Vital signs reviewed, Patient's Cardiovascular Status Stable, Respiratory Function Stable, Patent Airway and No signs of Nausea or vomiting  Post-op Vital Signs: Reviewed and stable  Complications: No apparent anesthesia complications

## 2016-07-21 NOTE — Anesthesia Preprocedure Evaluation (Signed)
Anesthesia Evaluation  Patient identified by MRN, date of birth, ID band Patient awake    Reviewed: Allergy & Precautions, H&P , NPO status , Patient's Chart, lab work & pertinent test results, reviewed documented beta blocker date and time   Airway Mallampati: II  TM Distance: >3 FB Neck ROM: full    Dental no notable dental hx.    Pulmonary neg pulmonary ROS,    Pulmonary exam normal breath sounds clear to auscultation       Cardiovascular Exercise Tolerance: Good negative cardio ROS   Rhythm:regular Rate:Normal     Neuro/Psych negative neurological ROS  negative psych ROS   GI/Hepatic Neg liver ROS, hiatal hernia, Medicated,Barrett's esophagus   Endo/Other  negative endocrine ROS  Renal/GU negative Renal ROS  negative genitourinary   Musculoskeletal   Abdominal   Peds  Hematology negative hematology ROS (+)   Anesthesia Other Findings   Reproductive/Obstetrics negative OB ROS                             Anesthesia Physical Anesthesia Plan  ASA: II  Anesthesia Plan: MAC   Post-op Pain Management:    Induction:   Airway Management Planned:   Additional Equipment:   Intra-op Plan:   Post-operative Plan:   Informed Consent: I have reviewed the patients History and Physical, chart, labs and discussed the procedure including the risks, benefits and alternatives for the proposed anesthesia with the patient or authorized representative who has indicated his/her understanding and acceptance.   Dental Advisory Given  Plan Discussed with: CRNA and Anesthesiologist  Anesthesia Plan Comments:         Anesthesia Quick Evaluation

## 2016-07-21 NOTE — Op Note (Signed)
Date of Surgery: 07/21/2016  PREOPERATIVE DIAGNOSES: Visually significant nuclear sclerotic cataract, left eye.  POSTOPERATIVE DIAGNOSES: Same  PROCEDURES PERFORMED: Cataract extraction with intraocular lens implant, left eye with aid of Malyugin ring.  SURGEON: Almon Hercules, M.D.  ANESTHESIA: MAC and topical  IMPLANTS: [id number] +21.0 D  Implant Name Type Inv. Item Serial No. Manufacturer Lot No. LRB No. Used  LENS IOL ACRYSOF IQ 21.0 - [id number] Intraocular Lens LENS IOL ACRYSOF IQ 21.0 [id number] ALCON   Left 1    COMPLICATIONS: None.  DESCRIPTION OF PROCEDURE: Therapeutic options were discussed with the patient preoperatively, including a discussion of risks and benefits of surgery. Informed consent was obtained. An IOL-Master and immersion biometry were used to take the lens measurements, and a dilated fundus exam was performed within 6 months of the surgical date.  The patient was premedicated and brought to the operating room and placed on the operating table in the supine position. After adequate anesthesia, the patient was prepped and draped in the usual sterile ophthalmic fashion. A wire lid speculum was inserted and the microscope was positioned. A Superblade was used to create a paracentesis site at the limbus and a small amount of dilute preservative free lidocaine was instilled into the anterior chamber, followed by dispersive viscoelastic. A clear corneal incision was created temporally using a 2.4 mm keratome blade. Due to a 4mm dilation, a Malyugin ring was placed.  Capsulorrhexis was then performed. In situ phacoemulsification was performed.  Cortical material was removed with the irrigation-aspiration unit. Dispersive viscoelastic was instilled to open the capsular bag. A posterior chamber intraocular lens with the specifications above was inserted and positioned. Malyugin ring was removed. Irrigation-aspiration was used to remove all viscoelastic. Cefuroxime 1cc was  instilled into the anterior chamber, and the corneal incision was checked and found to be water tight. The eyelid speculum was removed.  The operative eye was covered with protective goggles after instilling 1 drop of timolol and brimonidine. The patient tolerated the procedure well. There were no complications.

## 2016-07-22 ENCOUNTER — Encounter: Payer: Self-pay | Admitting: Ophthalmology

## 2016-07-22 NOTE — Progress Notes (Signed)
Pt called back. Pt stated that the staff was very nice.

## 2016-07-23 ENCOUNTER — Other Ambulatory Visit: Payer: Self-pay | Admitting: Family Medicine

## 2016-07-23 DIAGNOSIS — E785 Hyperlipidemia, unspecified: Secondary | ICD-10-CM

## 2016-07-23 NOTE — Telephone Encounter (Signed)
Please review-aa 

## 2016-09-12 ENCOUNTER — Telehealth: Payer: Self-pay

## 2016-09-12 ENCOUNTER — Encounter: Payer: Self-pay | Admitting: Physician Assistant

## 2016-09-12 ENCOUNTER — Ambulatory Visit (INDEPENDENT_AMBULATORY_CARE_PROVIDER_SITE_OTHER): Payer: BLUE CROSS/BLUE SHIELD | Admitting: Physician Assistant

## 2016-09-12 VITALS — BP 120/70 | HR 84 | Temp 98.1°F | Resp 16 | Wt 135.0 lb

## 2016-09-12 DIAGNOSIS — H6122 Impacted cerumen, left ear: Secondary | ICD-10-CM | POA: Diagnosis not present

## 2016-09-12 DIAGNOSIS — L232 Allergic contact dermatitis due to cosmetics: Secondary | ICD-10-CM

## 2016-09-12 MED ORDER — TRIAMCINOLONE ACETONIDE 0.1 % EX CREA: 1.0000 "application " | TOPICAL_CREAM | Freq: Two times a day (BID) | CUTANEOUS | 0 refills | Status: DC

## 2016-09-12 NOTE — Telephone Encounter (Signed)
OK 

## 2016-09-12 NOTE — Telephone Encounter (Signed)
Patient called in to schedule in appointment for a rash below her right eye. Patient reports itching and spreading. Patient denies pain at rash area and denies any visual changes. Pt has appointment at 3 pm. Please review. Thank you. sd

## 2016-09-12 NOTE — Progress Notes (Signed)
Patient: Kristen Hays Female    DOB: 1946/02/03   71 y.o.   MRN: [id number] Visit Date: 09/12/2016  Today's Provider: Mar Daring, PA-C   Chief Complaint  Patient presents with  . Rash   Subjective:    Rash  This is a new problem. The current episode started in the past 7 days (x 5 days). The problem has been gradually worsening since onset. Location: right eye. The rash is characterized by redness and itchiness. Pertinent negatives include no anorexia, congestion, cough, diarrhea, eye pain, fatigue, fever, joint pain, rhinorrhea, shortness of breath, sore throat or vomiting. Treatments tried: Neosporin. The treatment provided no relief.  She did start a new facial lotion 3 weeks ago.    No Known Allergies   Current Outpatient Prescriptions:  .  atorvastatin (LIPITOR) 10 MG tablet, TAKE 1 TABLET BY MOUTH AT BEDTIME, Disp: 90 tablet, Rfl: 1 .  Cholecalciferol (VITAMIN D) 2000 UNITS CAPS, Take 1 capsule by mouth daily., Disp: , Rfl:  .  escitalopram (LEXAPRO) 10 MG tablet, TAKE 1/2 TABLET BY MOUTH EVERY DAY FOR 1 WEEK THEN TAKE 1 TABLET EVERY DAY THEREAFTER, Disp: 30 tablet, Rfl: 5 .  omeprazole (PRILOSEC) 20 MG capsule, Take 20 mg by mouth daily., Disp: , Rfl:   Review of Systems  Constitutional: Negative for fatigue and fever.  HENT: Negative for congestion, rhinorrhea and sore throat.   Eyes: Negative for pain and visual disturbance.  Respiratory: Negative for cough and shortness of breath.   Gastrointestinal: Negative for anorexia, diarrhea and vomiting.  Musculoskeletal: Negative for joint pain.  Skin: Positive for rash.    Social History  Substance Use Topics  . Smoking status: Never Smoker  . Smokeless tobacco: Never Used  . Alcohol use No   Objective:   BP 120/70 (BP Location: Left Arm, Patient Position: Sitting, Cuff Size: Normal)   Pulse 84   Temp 98.1 F (36.7 C) (Oral)   Resp 16   Wt 135 lb (61.2 kg)   BMI 24.30 kg/m   Physical Exam    Constitutional: She appears well-developed and well-nourished. No distress.  HENT:  Head: Normocephalic and atraumatic.  Right Ear: Hearing, tympanic membrane, external ear and ear canal normal.  Left Ear: Hearing, external ear and ear canal normal.  Nose: Nose normal.  Mouth/Throat: Oropharynx is clear and moist. No oropharyngeal exudate.  Left ear TM not visualized due to impaction. Ear lavage attempted but unsuccessful.  Eyes: Conjunctivae and EOM are normal. Pupils are equal, round, and reactive to light. Right eye exhibits no discharge. Left eye exhibits no discharge.  Neck: Normal range of motion. Neck supple. No tracheal deviation present. No thyromegaly present.  Cardiovascular: Normal rate, regular rhythm and normal heart sounds.  Exam reveals no gallop and no friction rub.   No murmur heard. Pulmonary/Chest: Effort normal and breath sounds normal. No respiratory distress. She has no wheezes. She has no rales.  Lymphadenopathy:    She has no cervical adenopathy.  Skin: Rash noted. Rash is maculopapular. She is not diaphoretic.     Vitals reviewed.      Assessment & Plan:     1. Allergic contact dermatitis due to cosmetics Suspect contact dermatitis due to new facial lotion. Will give triamcinolone cream as below. Advised to keep away from eye. May use benadryl cream as well for itch. May take oral benadryl at bedtime for itching. She is to call if symptoms worsen. - triamcinolone cream (KENALOG)  0.1 %; Apply 1 application topically 2 (two) times daily.  Dispense: 30 g; Refill: 0  2. Impacted cerumen of left ear Ear lavage attempted on the left ear unsucccesfsully. Advised patient to use debrox ear drops as directed on the package instructions and call if unable to clear wax herself, or if she develops pain, drainage, dizziness or hearing loss. - Ear Lavage     Patient seen and examined by Mar Daring, PA-C, and note scribed by Renaldo Fiddler, CMA.  Mar Daring, PA-C  [facility]

## 2016-09-12 NOTE — Patient Instructions (Signed)
Contact Dermatitis Introduction Dermatitis is redness, soreness, and swelling (inflammation) of the skin. Contact dermatitis is a reaction to certain substances that touch the skin. There are two types of contact dermatitis:  Irritant contact dermatitis. This type is caused by something that irritates your skin, such as dry hands from washing them too much. This type does not require previous exposure to the substance for a reaction to occur. This type is more common.  Allergic contact dermatitis. This type is caused by a substance that you are allergic to, such as a nickel allergy or poison ivy. This type only occurs if you have been exposed to the substance (allergen) before. Upon a repeat exposure, your body reacts to the substance. This type is less common. What are the causes? Many different substances can cause contact dermatitis. Irritant contact dermatitis is most commonly caused by exposure to:  Makeup.  Soaps.  Detergents.  Bleaches.  Acids.  Metal salts, such as nickel. Allergic contact dermatitis is most commonly caused by exposure to:  Poisonous plants.  Chemicals.  Jewelry.  Latex.  Medicines.  Preservatives in products, such as clothing. What increases the risk? This condition is more likely to develop in:  People who have jobs that expose them to irritants or allergens.  People who have certain medical conditions, such as asthma or eczema. What are the signs or symptoms? Symptoms of this condition may occur anywhere on your body where the irritant has touched you or is touched by you. Symptoms include:  Dryness or flaking.  Redness.  Cracks.  Itching.  Pain or a burning feeling.  Blisters.  Drainage of small amounts of blood or clear fluid from skin cracks. With allergic contact dermatitis, there may also be swelling in areas such as the eyelids, mouth, or genitals. How is this diagnosed? This condition is diagnosed with a medical history and  physical exam. A patch skin test may be performed to help determine the cause. If the condition is related to your job, you may need to see an occupational medicine specialist. How is this treated? Treatment for this condition includes figuring out what caused the reaction and protecting your skin from further contact. Treatment may also include:  Steroid creams or ointments. Oral steroid medicines may be needed in more severe cases.  Antibiotics or antibacterial ointments, if a skin infection is present.  Antihistamine lotion or an antihistamine taken by mouth to ease itching.  A bandage (dressing). Follow these instructions at home: [place] your skin as needed.  Apply cool compresses to the affected areas.  Try taking a bath with:  Epsom salts. Follow the instructions on the packaging. You can get these at your local pharmacy or grocery store.  Baking soda. Pour a small amount into the bath as directed by your health care provider.  Colloidal oatmeal. Follow the instructions on the packaging. You can get this at your local pharmacy or grocery store.  Try applying baking soda paste to your skin. Stir water into baking soda until it reaches a paste-like consistency.  Do not scratch your skin.  Bathe less frequently, such as every other day.  Bathe in lukewarm water. Avoid using hot water. Medicines  Take or apply over-the-counter and prescription medicines only as told by your health care provider.  If you were prescribed an antibiotic medicine, take or apply your antibiotic as told by your health care provider. Do not stop using the antibiotic even if your condition starts to improve. General instructions  Keep all follow-up visits as told by your health care provider. This is important.  Avoid the substance that caused your reaction. If you do not know what caused it, keep a journal to try to track what caused it. Write down:  What you eat.  What cosmetic  products you use.  What you drink.  What you wear in the affected area. This includes jewelry.  If you were given a dressing, take care of it as told by your health care provider. This includes when to change and remove it. Contact a health care provider if:  Your condition does not improve with treatment.  Your condition gets worse.  You have signs of infection such as swelling, tenderness, redness, soreness, or warmth in the affected area.  You have a fever.  You have new symptoms. Get help right away if:  You have a severe headache, neck pain, or neck stiffness.  You vomit.  You feel very sleepy.  You notice red streaks coming from the affected area.  Your bone or joint underneath the affected area becomes painful after the skin has healed.  The affected area turns darker.  You have difficulty breathing. This information is not intended to replace advice given to you by your health care provider. Make sure you discuss any questions you have with your health care provider. Document Released: 08/15/2000 Document Revised: 01/24/2016 Document Reviewed: 01/03/2015  2017 Elsevier

## 2016-11-07 ENCOUNTER — Other Ambulatory Visit: Payer: Self-pay | Admitting: Physician Assistant

## 2016-11-12 ENCOUNTER — Encounter: Payer: Self-pay | Admitting: Physician Assistant

## 2016-11-12 ENCOUNTER — Ambulatory Visit (INDEPENDENT_AMBULATORY_CARE_PROVIDER_SITE_OTHER): Payer: BLUE CROSS/BLUE SHIELD | Admitting: Physician Assistant

## 2016-11-12 VITALS — BP 100/66 | HR 60 | Temp 97.8°F | Resp 16 | Ht 64.0 in | Wt 137.0 lb

## 2016-11-12 DIAGNOSIS — F324 Major depressive disorder, single episode, in partial remission: Secondary | ICD-10-CM

## 2016-11-12 MED ORDER — FLUOXETINE HCL 20 MG PO TABS: ORAL_TABLET | ORAL | 0 refills | Status: DC

## 2016-11-12 NOTE — Progress Notes (Signed)
Patient: Kristen Hays Female    DOB: [birth year]   71 y.o.   MRN: [id number] Visit Date: 11/12/2016  Today's Provider: Mar Daring, PA-C   Chief Complaint  Patient presents with  . Anxiety   Subjective:    Patient here today C/O of worsening anxiety and now having depression. Patient reports her symptoms have been worsening in the last 3 months. Patient reports that she notices depression more at work. Patient reports that she has problems with a co-worker. 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   She has a co-worker that has been bullying her in a way. He has been drawing pictures of her and making fun of her. She feels now that he has made other people not like her as well since she complained about him. He also will purposefully talk very loudly to other people walking down the hallway but ignores her. She has complained to management and they told them to ignore each other. She has also tried to apply to other departments within the company but none are hiring at this time.     No Known Allergies   Current Outpatient Prescriptions:  .  atorvastatin (LIPITOR) 10 MG tablet, TAKE 1 TABLET BY MOUTH AT BEDTIME, Disp: 90 tablet, Rfl: 1 .  Cholecalciferol (VITAMIN D) 2000 UNITS CAPS, Take 1 capsule by mouth daily., Disp: , Rfl:  .  escitalopram (LEXAPRO) 10 MG tablet, TAKE 1/2 TABLET BY MOUTH EVERY DAY FOR 1 WEEK THEN TAKE 1 TABLET EVERY DAY THEREAFTER, Disp: 30 tablet, Rfl: 5 .  omeprazole (PRILOSEC) 20 MG capsule, Take 20 mg by mouth daily., Disp: , Rfl:   Review of Systems  Constitutional: Negative.   Respiratory: Negative.   Cardiovascular: Negative.   Psychiatric/Behavioral: Positive for agitation. The patient is nervous/anxious.     Social History  Substance Use Topics  . Smoking status: Never Smoker  . Smokeless tobacco: Never Used  . Alcohol use No   Objective:   BP 100/66 (BP Location: Left Arm, Patient Position: Sitting, Cuff Size:  Large)   Pulse 60   Temp 97.8 F (36.6 C) (Oral)   Resp 16   Ht 5\' 4"  (1.626 m)   Wt 137 lb (62.1 kg)   SpO2 97%   BMI 23.52 kg/m  Vitals:   11/12/16 1649  BP: 100/66  Pulse: 60  Resp: 16  Temp: 97.8 F (36.6 C)  TempSrc: Oral  SpO2: 97%  Weight: 137 lb (62.1 kg)  Height: 5\' 4"  (1.626 m)    Depression screen [facility] 2/9 11/12/2016 01/07/2016  Decreased Interest 3 0  Down, Depressed, Hopeless 3 0  PHQ - 2 Score 6 0  Altered sleeping 0 -  Tired, decreased energy 2 -  Change in appetite 3 -  Feeling bad or failure about yourself  3 -  Trouble concentrating 0 -  Moving slowly or fidgety/restless 0 -  Suicidal thoughts 0 -  PHQ-9 Score 14 -  Difficult doing work/chores Very difficult -   GAD 7 : Generalized Anxiety Score 11/12/2016  Nervous, Anxious, on Edge 1  Control/stop worrying 1  Worry too much - different things 1  Trouble relaxing 0  Restless 0  Easily annoyed or irritable 1  Afraid - awful might happen 0  Total GAD 7 Score 4  Anxiety Difficulty Somewhat difficult     Physical Exam  Constitutional: She appears well-developed and well-nourished. No distress.  Neck: Normal range of motion.  Neck supple.  Cardiovascular: Normal rate, regular rhythm and normal heart sounds.  Exam reveals no gallop and no friction rub.   No murmur heard. Pulmonary/Chest: Effort normal and breath sounds normal. No respiratory distress. She has no wheezes. She has no rales.  Skin: She is not diaphoretic.  Psychiatric: Her speech is normal and behavior is normal. Judgment and thought content normal. Her mood appears anxious. Cognition and memory are normal. She exhibits a depressed mood.  Vitals reviewed.     Assessment & Plan:     1. Depression, major, single episode, in partial remission ([place]) She has been stable on lexapro 10mg  for a while now. She is now not feeling the control like previously. She is requesting to change therapy instead of increasing. Will change to Prozac as  below. She is to call if she has worsening symptoms or adverse reactions. I will see her back in 4 weeks for further evaluation. - FLUoxetine (PROZAC) 20 MG tablet; Take 1/2 tab PO daily x 1 week then increase to 1 tab PO daily  Dispense: 30 tablet; Refill: 0       Mar Daring, PA-C  [facility]

## 2016-11-12 NOTE — Patient Instructions (Signed)
Fluoxetine capsules or tablets (Depression/Mood Disorders) What is this medicine? FLUOXETINE (floo OX e teen) belongs to a class of drugs known as selective serotonin reuptake inhibitors (SSRIs). It helps to treat mood problems such as depression, obsessive compulsive disorder, and panic attacks. It can also treat certain eating disorders. This medicine may be used for other purposes; ask your health care provider or pharmacist if you have questions. COMMON BRAND NAME(S): Prozac What should I tell my health care provider before I take this medicine? They need to know if you have any of these conditions: -bipolar disorder or a family history of bipolar disorder -bleeding disorders -glaucoma -heart disease -liver disease -low levels of sodium in the blood -seizures -suicidal thoughts, plans, or attempt; a previous suicide attempt by you or a family member -take MAOIs like Carbex, Eldepryl, Marplan, Nardil, and Parnate -take medicines that treat or prevent blood clots -thyroid disease -an unusual or allergic reaction to fluoxetine, other medicines, foods, dyes, or preservatives -pregnant or trying to get pregnant -breast-feeding How should I use this medicine? Take this medicine by mouth with a glass of water. Follow the directions on the prescription label. You can take this medicine with or without food. Take your medicine at regular intervals. Do not take it more often than directed. Do not stop taking this medicine suddenly except upon the advice of your doctor. Stopping this medicine too quickly may cause serious side effects or your condition may worsen. A special MedGuide will be given to you by the pharmacist with each prescription and refill. Be sure to read this information carefully each time. Talk to your pediatrician regarding the use of this medicine in children. While this drug may be prescribed for children as young as 7 years for selected conditions, precautions do  apply. Overdosage: If you think you have taken too much of this medicine contact a poison control center or emergency room at once. NOTE: This medicine is only for you. Do not share this medicine with others. What if I miss a dose? If you miss a dose, skip the missed dose and go back to your regular dosing schedule. Do not take double or extra doses. What may interact with this medicine? Do not take this medicine with any of the following medications: -other medicines containing fluoxetine, like Sarafem or Symbyax -cisapride -linezolid -MAOIs like Carbex, Eldepryl, Marplan, Nardil, and Parnate -methylene blue (injected into a vein) -pimozide -thioridazine This medicine may also interact with the following medications: -alcohol -amphetamines -aspirin and aspirin-like medicines -carbamazepine -certain medicines for depression, anxiety, or psychotic disturbances -certain medicines for migraine headaches like almotriptan, eletriptan, frovatriptan, naratriptan, rizatriptan, sumatriptan, zolmitriptan -digoxin -diuretics -fentanyl -flecainide -furazolidone -isoniazid -lithium -medicines for sleep -medicines that treat or prevent blood clots like warfarin, enoxaparin, and dalteparin -NSAIDs, medicines for pain and inflammation, like ibuprofen or naproxen -phenytoin -procarbazine -propafenone -rasagiline -ritonavir -supplements like St. John's wort, kava kava, valerian -tramadol -tryptophan -vinblastine This list may not describe all possible interactions. Give your health care provider a list of all the medicines, herbs, non-prescription drugs, or dietary supplements you use. Also tell them if you smoke, drink alcohol, or use illegal drugs. Some items may interact with your medicine. What should I watch for while using this medicine? Tell your doctor if your symptoms do not get better or if they get worse. Visit your doctor or health care professional for regular checks on your  progress. Because it may take several weeks to see the full effects of this medicine, it   is important to continue your treatment as prescribed by your doctor. Patients and their families should watch out for new or worsening thoughts of suicide or depression. Also watch out for sudden changes in feelings such as feeling anxious, agitated, panicky, irritable, hostile, aggressive, impulsive, severely restless, overly excited and hyperactive, or not being able to sleep. If this happens, especially at the beginning of treatment or after a change in dose, call your health care professional. You may get drowsy or dizzy. Do not drive, use machinery, or do anything that needs mental alertness until you know how this medicine affects you. Do not stand or sit up quickly, especially if you are an older patient. This reduces the risk of dizzy or fainting spells. Alcohol may interfere with the effect of this medicine. Avoid alcoholic drinks. Your mouth may get dry. Chewing sugarless gum or sucking hard candy, and drinking plenty of water may help. Contact your doctor if the problem does not go away or is severe. This medicine may affect blood sugar levels. If you have diabetes, check with your doctor or health care professional before you change your diet or the dose of your diabetic medicine. What side effects may I notice from receiving this medicine? Side effects that you should report to your doctor or health care professional as soon as possible: -allergic reactions like skin rash, itching or hives, swelling of the face, lips, or tongue -anxious -black, tarry stools -breathing problems -changes in vision -confusion -elevated mood, decreased need for sleep, racing thoughts, impulsive behavior -eye pain -fast, irregular heartbeat -feeling faint or lightheaded, falls -feeling agitated, angry, or irritable -hallucination, loss of contact with reality -loss of balance or coordination -loss of memory -painful  or prolonged erections -restlessness, pacing, inability to keep still -seizures -stiff muscles -suicidal thoughts or other mood changes -trouble sleeping -unusual bleeding or bruising -unusually weak or tired -vomiting Side effects that usually do not require medical attention (report to your doctor or health care professional if they continue or are bothersome): -change in appetite or weight -change in sex drive or performance -diarrhea -dry mouth -headache -increased sweating -nausea -tremors This list may not describe all possible side effects. Call your doctor for medical advice about side effects. You may report side effects to FDA at 1-800-FDA-1088. Where should I keep my medicine? Keep out of the reach of children. Store at room temperature between 15 and 30 degrees C (59 and 86 degrees F). Throw away any unused medicine after the expiration date. NOTE: This sheet is a summary. It may not cover all possible information. If you have questions about this medicine, talk to your doctor, pharmacist, or health care provider.  2018 Elsevier/Gold Standard (2016-01-19 15:55:27)  

## 2016-12-10 ENCOUNTER — Encounter: Payer: Self-pay | Admitting: Physician Assistant

## 2016-12-10 ENCOUNTER — Ambulatory Visit (INDEPENDENT_AMBULATORY_CARE_PROVIDER_SITE_OTHER): Payer: BLUE CROSS/BLUE SHIELD | Admitting: Physician Assistant

## 2016-12-10 DIAGNOSIS — F324 Major depressive disorder, single episode, in partial remission: Secondary | ICD-10-CM

## 2016-12-10 MED ORDER — FLUOXETINE HCL 20 MG PO TABS: 20.0000 mg | ORAL_TABLET | Freq: Every day | ORAL | 5 refills | Status: DC

## 2016-12-10 NOTE — Progress Notes (Signed)
       Patient: Kristen Hays Female    DOB: [birth year]   71 y.o.   MRN: [id number] Visit Date: 12/10/2016  Today's Provider: Mar Daring, PA-C   Chief Complaint  Patient presents with  . Follow-up    depression   Subjective:    HPI Patient here today to follow-up Depression. Patient was stable on Lexapro but was having worsening symptoms last office visit. Patient requested change in therapy instead of increasing the Lexapro. She got started on Fluoxetine 20 mg. Patient reports good tolerance and compliance of medication. Patient reports that symptoms are about 50% better on Fluoxetine.     No Known Allergies   Current Outpatient Prescriptions:  .  atorvastatin (LIPITOR) 10 MG tablet, TAKE 1 TABLET BY MOUTH AT BEDTIME, Disp: 90 tablet, Rfl: 1 .  Cholecalciferol (VITAMIN D) 2000 UNITS CAPS, Take 1 capsule by mouth daily., Disp: , Rfl:  .  FLUoxetine (PROZAC) 20 MG tablet, Take 1/2 tab PO daily x 1 week then increase to 1 tab PO daily, Disp: 30 tablet, Rfl: 0 .  omeprazole (PRILOSEC) 20 MG capsule, Take 20 mg by mouth daily., Disp: , Rfl:   Review of Systems  Constitutional: Negative.   Respiratory: Negative.   Cardiovascular: Negative.   Gastrointestinal: Negative.   Neurological: Positive for headaches. Negative for dizziness.  Psychiatric/Behavioral: Negative for decreased concentration, dysphoric mood and sleep disturbance. The patient is nervous/anxious.     Social History  Substance Use Topics  . Smoking status: Never Smoker  . Smokeless tobacco: Never Used  . Alcohol use No   Objective:   BP 100/60 (BP Location: Left Arm, Patient Position: Sitting, Cuff Size: Large)   Pulse 64   Temp 97.8 F (36.6 C) (Oral)   Resp 16   Wt 135 lb 3.2 oz (61.3 kg)   BMI 23.21 kg/m  Vitals:   12/10/16 1623  BP: 100/60  Pulse: 64  Resp: 16  Temp: 97.8 F (36.6 C)  TempSrc: Oral  Weight: 135 lb 3.2 oz (61.3 kg)     Physical Exam  Constitutional: She appears  well-developed and well-nourished. No distress.  Neck: Normal range of motion. Neck supple. No JVD present. No tracheal deviation present. No thyromegaly present.  Cardiovascular: Normal rate, regular rhythm and normal heart sounds.  Exam reveals no gallop and no friction rub.   No murmur heard. Pulmonary/Chest: Effort normal and breath sounds normal. No respiratory distress. She has no wheezes. She has no rales.  Lymphadenopathy:    She has no cervical adenopathy.  Skin: She is not diaphoretic.  Psychiatric: Her speech is normal and behavior is normal. Judgment and thought content normal. Her mood appears anxious. Cognition and memory are normal.  Vitals reviewed.      Assessment & Plan:     1. Depression, major, single episode, in partial remission ([place]) Improved and stable with change to Prozac. Refill placed as below. She is scheduled to return on 01/08/17 for CPE. - FLUoxetine (PROZAC) 20 MG tablet; Take 1 tablet (20 mg total) by mouth daily.  Dispense: 30 tablet; Refill: [place], PA-C  [facility]

## 2016-12-10 NOTE — Patient Instructions (Signed)
10 Relaxation Techniques That Zap Stress Fast By Jeannette Moninger   Listen  Relax. You deserve it, it's good for you, and it takes less time than you think. You don't need a spa weekend or a retreat. Each of these stress-relieving tips can get you from OMG to om in less than 15 minutes. 1. Meditate  A few minutes of practice per day can help ease anxiety. "Research suggests that daily meditation may alter the brain's neural pathways, making you more resilient to stress," says psychologist Robbie Maller Hartman, PhD, a [place] health and wellness coach. It's simple. Sit up straight with both feet on the floor. Close your eyes. Focus your attention on reciting -- out loud or silently -- a positive mantra such as "I feel at peace" or "I love myself." Place one hand on your belly to sync the mantra with your breaths. Let any distracting thoughts float by like clouds. 2. Breathe Deeply  Take a 5-minute break and focus on your breathing. Sit up straight, eyes closed, with a hand on your belly. Slowly inhale through your nose, feeling the breath start in your abdomen and work its way to the top of your head. Reverse the process as you exhale through your mouth.  "Deep breathing counters the effects of stress by slowing the heart rate and lowering blood pressure," psychologist Judith Tutin, PhD, says. She's a certified life coach in [place], [place] 3. Be Present  Slow down.  "Take 5 minutes and focus on only one behavior with awareness," Tutin says. Notice how the air feels on your face when you're walking and how your feet feel hitting the ground. Enjoy the texture and taste of each bite of food. When you spend time in the moment and focus on your senses, you should feel less tense. 4. Reach Out  Your social network is one of your best tools for handling stress. Talk to others -- preferably face to face, or at least on the phone. Share what's going on. You can get a fresh perspective while keeping your  connection strong. 5. Tune In to Your Body  Mentally scan your body to get a sense of how stress affects it each day. Lie on your back, or sit with your feet on the floor. Start at your toes and work your way up to your scalp, noticing how your body feels.  10 Relaxation Techniques That Zap Stress Fast By Jeannette Moninger   Listen  "Simply be aware of places you feel tight or loose without trying to change anything," Tutin says. For 1 to 2 minutes, imagine each deep breath flowing to that body part. Repeat this process as you move your focus up your body, paying close attention to sensations you feel in each body part. 6. Decompress  Place a warm heat wrap around your neck and shoulders for 10 minutes. Close your eyes and relax your face, neck, upper chest, and back muscles. Remove the wrap, and use a tennis ball or foam roller to massage away tension.  "Place the ball between your back and the wall. Lean into the ball, and hold gentle pressure for up to 15 seconds. Then move the ball to another spot, and apply pressure," says Cathy Benninger, a nurse practitioner and assistant professor at [employer] [facility] [employer] [facility] in [place]. 7. Laugh Out Loud  A good belly laugh doesn't just lighten the load mentally. It lowers cortisol, your body's stress hormone, and boosts brain chemicals called endorphins, which help   your mood. Lighten up by tuning in to your favorite sitcom or video, reading the comics, or chatting with someone who makes you smile. 8. Crank Up the Tunes  Research shows that listening to soothing music can lower blood pressure, heart rate, and anxiety. "Create a playlist of songs or nature sounds (the ocean, a bubbling brook, birds chirping), and allow your mind to focus on the different melodies, instruments, or singers in the piece," Benninger says. You also can blow off steam by rocking out to more upbeat tunes -- or singing at the top of your lungs! 9. Get Moving   You don't have to run in order to get a runner's high. All forms of exercise, including yoga and walking, can ease depression and anxiety by helping the brain release feel-good chemicals and by giving your body a chance to practice dealing with stress. You can go for a quick walk around the block, take the stairs up and down a few flights, or do some stretching exercises like head rolls and shoulder shrugs. 10. Be Grateful  Keep a gratitude journal or several (one by your bed, one in your purse, and one at work) to help you remember all the things that are good in your life.  "Being grateful for your blessings cancels out negative thoughts and worries," says Joni Emmerling, a wellness coach in [place], [place].  Use these journals to savor good experiences like a child's smile, a sunshine-filled day, and good health. Don't forget to celebrate accomplishments like mastering a new task at work or a new hobby. When you start feeling stressed, spend a few minutes looking through your notes to remind yourself what really matters.  

## 2016-12-11 ENCOUNTER — Other Ambulatory Visit: Payer: Self-pay | Admitting: Physician Assistant

## 2016-12-11 DIAGNOSIS — Z1231 Encounter for screening mammogram for malignant neoplasm of breast: Secondary | ICD-10-CM

## 2017-01-05 ENCOUNTER — Telehealth: Payer: Self-pay | Admitting: Physician Assistant

## 2017-01-05 DIAGNOSIS — E7800 Pure hypercholesterolemia, unspecified: Secondary | ICD-10-CM

## 2017-01-05 DIAGNOSIS — Z833 Family history of diabetes mellitus: Secondary | ICD-10-CM

## 2017-01-05 NOTE — Telephone Encounter (Signed)
Labs ordered.

## 2017-01-05 NOTE — Telephone Encounter (Signed)
CPE scheduled for 01/08/2017. Renaldo Fiddler, CMA

## 2017-01-05 NOTE — Telephone Encounter (Signed)
Pt is requesting a lb slip to have labs done before her CPE.  LK#[phone]/DS

## 2017-01-07 ENCOUNTER — Telehealth: Payer: Self-pay | Admitting: Physician Assistant

## 2017-01-07 DIAGNOSIS — F324 Major depressive disorder, single episode, in partial remission: Secondary | ICD-10-CM

## 2017-01-07 MED ORDER — FLUOXETINE HCL 20 MG PO TABS: 20.0000 mg | ORAL_TABLET | Freq: Every day | ORAL | 1 refills | Status: DC

## 2017-01-07 NOTE — Telephone Encounter (Signed)
[facility] faxed a request for a 90-days supply on the following medication. Thanks CC  FLUoxetine (PROZAC) 20 MG tablet  Take 1 tablet ( 20 MG Total by mouth daily.

## 2017-01-07 NOTE — Telephone Encounter (Signed)
90 day fluoxetine refill sent to [place]

## 2017-01-08 ENCOUNTER — Ambulatory Visit (INDEPENDENT_AMBULATORY_CARE_PROVIDER_SITE_OTHER): Payer: BLUE CROSS/BLUE SHIELD | Admitting: Physician Assistant

## 2017-01-08 ENCOUNTER — Encounter: Payer: Self-pay | Admitting: Physician Assistant

## 2017-01-08 VITALS — BP 118/72 | HR 68 | Temp 97.8°F | Resp 16 | Ht 61.75 in | Wt 136.0 lb

## 2017-01-08 DIAGNOSIS — F320 Major depressive disorder, single episode, mild: Secondary | ICD-10-CM | POA: Insufficient documentation

## 2017-01-08 DIAGNOSIS — Z9011 Acquired absence of right breast and nipple: Secondary | ICD-10-CM | POA: Insufficient documentation

## 2017-01-08 DIAGNOSIS — E7800 Pure hypercholesterolemia, unspecified: Secondary | ICD-10-CM

## 2017-01-08 DIAGNOSIS — Z1239 Encounter for other screening for malignant neoplasm of breast: Secondary | ICD-10-CM

## 2017-01-08 DIAGNOSIS — Z853 Personal history of malignant neoplasm of breast: Secondary | ICD-10-CM | POA: Diagnosis not present

## 2017-01-08 DIAGNOSIS — Z1231 Encounter for screening mammogram for malignant neoplasm of breast: Secondary | ICD-10-CM

## 2017-01-08 DIAGNOSIS — Z0000 Encounter for general adult medical examination without abnormal findings: Secondary | ICD-10-CM

## 2017-01-08 DIAGNOSIS — N6459 Other signs and symptoms in breast: Secondary | ICD-10-CM | POA: Diagnosis not present

## 2017-01-08 DIAGNOSIS — Z1159 Encounter for screening for other viral diseases: Secondary | ICD-10-CM

## 2017-01-08 MED ORDER — FLUOXETINE HCL 40 MG PO CAPS: 40.0000 mg | ORAL_CAPSULE | Freq: Every day | ORAL | 1 refills | Status: DC

## 2017-01-08 NOTE — Progress Notes (Signed)
Patient: Kristen Hays, Female    DOB: Feb 10, 1946, 71 y.o.   MRN: [id number] Visit Date: 01/08/2017  Today's Provider: Mar Daring, PA-C   Chief Complaint  Patient presents with  . Annual Exam   Subjective:    Annual physical exam Kristen Hays is a 71 y.o. female who presents today for health maintenance and complete physical. She feels well. She reports exercising none. She reports she is sleeping well.  Last CPE- 01/07/2016 Last pap- 01/07/2016. Negative. HPV negative. Last mammogram- BI-RADS 1. Has mammo scheduled for 01/27/2017. Last colonoscopy- 01/17/2015. Dr. Vira Agar. Diverticulosis, internal hemorrhoids, tubular adenomas x 2. Last BMD- 01/23/2016- osteopenia. -----------------------------------------------------------------  Pt is c/o fatigue. She states, "I have no energy". This has been occurring for 2-3 months, and is unchanged. Pt states she snores, but has never had apneic episodes. Pt is getting at least 8 hours of sleep per night. Pt would like to know if there are any vitamins she can take to improve the fatigue. Epworth score today was 4.    Review of Systems  Constitutional: Positive for fatigue. Negative for activity change, appetite change, chills, diaphoresis, fever and unexpected weight change.  HENT: Negative.   Eyes: Negative.   Respiratory: Negative.   Cardiovascular: Negative.   Gastrointestinal: Negative.   Endocrine: Negative.   Genitourinary: Negative.   Musculoskeletal: Positive for back pain. Negative for arthralgias, gait problem, joint swelling, myalgias, neck pain and neck stiffness.  Skin: Negative.   Allergic/Immunologic: Negative.   Neurological: Positive for headaches. Negative for dizziness, tremors, seizures, syncope, facial asymmetry, speech difficulty, weakness, light-headedness and numbness.  Hematological: Negative.   Psychiatric/Behavioral: Negative for agitation, behavioral problems, confusion, decreased  concentration, dysphoric mood, hallucinations, self-injury, sleep disturbance and suicidal ideas. The patient is nervous/anxious. The patient is not hyperactive.     Social History      She  reports that she has never smoked. She has never used smokeless tobacco. She reports that she does not drink alcohol or use drugs.       Social History   Social History  . Marital status: Divorced    Spouse name: N/A  . Number of children: 2  . Years of education: HS   Occupational History  .  [employer] Main Topics  . Smoking status: Never Smoker  . Smokeless tobacco: Never Used  . Alcohol use No  . Drug use: No  . Sexual activity: Yes    Birth control/ protection: Post-menopausal   Other Topics Concern  . None   Social History Narrative  . None    Past Medical History:  Diagnosis Date  . Anxiety   . Breast cancer ([address])    right breast c Mastectomy   . Dysphagia   . High cholesterol      Patient Active Problem List   Diagnosis Date Noted  . Dermatitis 02/26/2016  . Tarsal tunnel syndrome of right side 06/21/2015  . Anxiety 05/18/2015  . Breast CA ([place]) 05/18/2015  . Globus hystericus 05/18/2015  . Blood in the urine 05/18/2015  . 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 HLD (hyperlipidemia) 05/18/2015  . LBP (low back pain) 05/18/2015  . 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 Cervical pain 05/18/2015  . Malignant neoplasm of female breast ([place]) 05/18/2015  . Barrett esophagus 03/04/2015  . H/O adenomatous polyp of colon 03/04/2015  . Abdominal bloating 12/28/2014    Past Surgical History:  Procedure Laterality Date  .  BREAST SURGERY     mastectomy-right; breast augmentation  . CATARACT EXTRACTION W/PHACO Left 07/21/2016   Procedure: CATARACT EXTRACTION PHACO AND INTRAOCULAR LENS PLACEMENT (IOC);  Surgeon: Ronnell Freshwater, MD;  Location: [facility];  Service: Ophthalmology;  Laterality: Left;  LEFT  . CESAREAN SECTION      1974/1979  . COLONOSCOPY N/A 01/17/2015   Procedure: COLONOSCOPY;  Surgeon: Manya Silvas, MD;  Location: [facility] ENDOSCOPY;  Service: Endoscopy;  Laterality: N/A;  . ESOPHAGOGASTRODUODENOSCOPY N/A 01/17/2015   Procedure: ESOPHAGOGASTRODUODENOSCOPY (EGD);  Surgeon: Manya Silvas, MD;  Location: [facility] ENDOSCOPY;  Service: Endoscopy;  Laterality: N/A;  . SAVORY DILATION N/A 01/17/2015   Procedure: SAVORY DILATION;  Surgeon: Manya Silvas, MD;  Location: [facility] ENDOSCOPY;  Service: Endoscopy;  Laterality: N/A;  . TONSILLECTOMY  2011    Family History        Family Status  Relation Status  . Mother Deceased  . Father Deceased  . Sister Alive  . Sister Alive  . Brother Alive  . Sister Alive  . Ethlyn Daniels (Not Specified)  . Cousin (Not Specified)        Her family history includes Alzheimer's disease in her mother; Bone cancer in her father; Breast cancer in her cousin, paternal aunt, and sister; Cancer in her sister; Diabetes in her mother and sister; Healthy in her brother and sister; Irritable bowel syndrome in her sister; Stroke in her mother; Vascular Disease in her father.     No Known Allergies   Current Outpatient Prescriptions:  .  atorvastatin (LIPITOR) 10 MG tablet, TAKE 1 TABLET BY MOUTH AT BEDTIME, Disp: 90 tablet, Rfl: 1 .  Cholecalciferol (VITAMIN D) 2000 UNITS CAPS, Take 1 capsule by mouth daily., Disp: , Rfl:  .  FLUoxetine (PROZAC) 20 MG tablet, Take 1 tablet (20 mg total) by mouth daily., Disp: 90 tablet, Rfl: 1 .  omeprazole (PRILOSEC) 20 MG capsule, Take 20 mg by mouth daily., Disp: , Rfl:    Patient Care Team: Mar Daring, PA-C as PCP - General (Family Medicine)      Objective:   Vitals: BP 118/72 (BP Location: Left Arm, Patient Position: Sitting, Cuff Size: Normal)   Pulse 68   Temp 97.8 F (36.6 C) (Oral)   Resp 16   Ht 5' 1.75" (1.568 m)   Wt 136 lb (61.7 kg)   BMI 25.08 kg/m    Vitals:   01/08/17 0903  BP: 118/72  Pulse: 68  Resp: 16    Temp: 97.8 F (36.6 C)  TempSrc: Oral  Weight: 136 lb (61.7 kg)  Height: 5' 1.75" (1.568 m)     Physical Exam  Constitutional: She is oriented to person, place, and time. She appears well-developed and well-nourished. No distress.  HENT:  Head: Normocephalic and atraumatic.  Right Ear: Hearing, tympanic membrane, external ear and ear canal normal.  Left Ear: Hearing, tympanic membrane, external ear and ear canal normal.  Nose: Nose normal.  Mouth/Throat: Uvula is midline, oropharynx is clear and moist and mucous membranes are normal. No oropharyngeal exudate.  Eyes: Conjunctivae and EOM are normal. Pupils are equal, round, and reactive to light. Right eye exhibits no discharge. Left eye exhibits no discharge. No scleral icterus.  Neck: Normal range of motion. Neck supple. No JVD present. Carotid bruit is not present. No tracheal deviation present. No thyromegaly present.  Cardiovascular: Normal rate, regular rhythm, normal heart sounds and intact distal pulses.  Exam reveals no gallop and no friction rub.  No murmur heard. Pulmonary/Chest: Effort normal and breath sounds normal. No respiratory distress. She has no wheezes. She has no rales. She exhibits no tenderness. Right breast exhibits skin change. Right breast exhibits no inverted nipple. Left breast exhibits no inverted nipple, no mass, no nipple discharge, no skin change and no tenderness. Breasts are asymmetrical.  Patient is s/p mastectomy of the right breast due to breast cancer. Right implant has abnormal sensation almost like there may be a slow leak. I can feel folds of plastic under the skin  Abdominal: Soft. Bowel sounds are normal. She exhibits no distension and no mass. There is no tenderness. There is no rebound and no guarding.  Musculoskeletal: Normal range of motion. She exhibits no edema or tenderness.  Lymphadenopathy:    She has no cervical adenopathy.  Neurological: She is alert and oriented to person, place, and  time.  Skin: Skin is warm and dry. No rash noted. She is not diaphoretic.  Psychiatric: She has a normal mood and affect. Her behavior is normal. Judgment and thought content normal.  Vitals reviewed.    Depression Screen PHQ 2/9 Scores 01/08/2017 11/12/2016 01/07/2016  PHQ - 2 Score 4 6 0  PHQ- 9 Score 11 14 -      Assessment & Plan:     Routine Health Maintenance and Physical Exam  Exercise Activities and Dietary recommendations Goals    None      Immunization History  Administered Date(s) Administered  . Influenza, High Dose Seasonal PF 06/06/2015  . Pneumococcal Conjugate-13 12/09/2013  . Pneumococcal Polysaccharide-23 01/07/2016    Health Maintenance  Topic Date Due  . Hepatitis C Screening  1945-12-16  . TETANUS/TDAP  05/03/1966  . INFLUENZA VACCINE  04/01/2017  . MAMMOGRAM  01/22/2018  . COLONOSCOPY  01/16/2025  . DEXA SCAN  Completed  . PNA vac Low Risk Adult  Completed     Discussed health benefits of physical activity, and encouraged her to engage in regular exercise appropriate for her age and condition.    1. Annual physical exam Normal physical exam today. Will check labs as below and f/u pending lab results. If labs are stable and WNL she will not need to have these rechecked for one year at her next annual physical exam. She is to call the office in the meantime if she has any acute issue, questions or concerns.  2. Breast cancer screening Mammogram scheduled for 01/27/17.   3. Pure hypercholesterolemia Stable. Continue atorvastatin. Labs are checked through employer with biometric screening.   4. Need for hepatitis C screening test - Hepatitis C Antibody  5. Abnormal breast exam See physical exam. Suspect implant may be slowly leaking. Saline implants. Referral placed to plastic surgery in [place] as below.  - Ambulatory referral to Plastic Surgery  6. History of cancer of right breast S/p mastectomy of right breast.  - Ambulatory  referral to Plastic Surgery  7. S/P mastectomy, right - Ambulatory referral to Plastic Surgery  8. Depression, major, single episode, mild (HCC) Not to goal. Medication increased to 40mg  from 20mg  as below. Diagnosis pulled for medication refill. Continue current medical treatment plan. - FLUoxetine (PROZAC) 40 MG capsule; Take 1 capsule (40 mg total) by mouth daily.  Dispense: 90 capsule; Refill: 1  --------------------------------------------------------------------    Mar Daring, PA-C  [facility]

## 2017-01-08 NOTE — Patient Instructions (Signed)
Health Maintenance for Postmenopausal Women Menopause is a normal process in which your reproductive ability comes to an end. This process happens gradually over a span of months to years, usually between the ages of 33 and 38. Menopause is complete when you have missed 12 consecutive menstrual periods. It is important to talk with your health care provider about some of the most common conditions that affect postmenopausal women, such as heart disease, cancer, and bone loss (osteoporosis). Adopting a healthy lifestyle and getting preventive care can help to promote your health and wellness. Those actions can also lower your chances of developing some of these common conditions. What should I know about menopause? During menopause, you may experience a number of symptoms, such as:  Moderate-to-severe hot flashes.  Night sweats.  Decrease in sex drive.  Mood swings.  Headaches.  Tiredness.  Irritability.  Memory problems.  Insomnia. Choosing to treat or not to treat menopausal changes is an individual decision that you make with your health care provider. What should I know about hormone replacement therapy and supplements? Hormone therapy products are effective for treating symptoms that are associated with menopause, such as hot flashes and night sweats. Hormone replacement carries certain risks, especially as you become older. If you are thinking about using estrogen or estrogen with progestin treatments, discuss the benefits and risks with your health care provider. What should I know about heart disease and stroke? Heart disease, heart attack, and stroke become more likely as you age. This may be due, in part, to the hormonal changes that your body experiences during menopause. These can affect how your body processes dietary fats, triglycerides, and cholesterol. Heart attack and stroke are both medical emergencies. There are many things that you can do to help prevent heart disease  and stroke:  Have your blood pressure checked at least every 1-2 years. High blood pressure causes heart disease and increases the risk of stroke.  If you are 48-61 years old, ask your health care provider if you should take aspirin to prevent a heart attack or a stroke.  Do not use any tobacco products, including cigarettes, chewing tobacco, or electronic cigarettes. If you need help quitting, ask your health care provider.  It is important to eat a healthy diet and maintain a healthy weight.  Be sure to include plenty of vegetables, fruits, low-fat dairy products, and lean protein.  Avoid eating foods that are high in solid fats, added sugars, or salt (sodium).  Get regular exercise. This is one of the most important things that you can do for your health.  Try to exercise for at least 150 minutes each week. The type of exercise that you do should increase your heart rate and make you sweat. This is known as moderate-intensity exercise.  Try to do strengthening exercises at least twice each week. Do these in addition to the moderate-intensity exercise.  Know your numbers.Ask your health care provider to check your cholesterol and your blood glucose. Continue to have your blood tested as directed by your health care provider. What should I know about cancer screening? There are several types of cancer. Take the following steps to reduce your risk and to catch any cancer development as early as possible. Breast Cancer  Practice breast self-awareness.  This means understanding how your breasts normally appear and feel.  It also means doing regular breast self-exams. Let your health care provider know about any changes, no matter how small.  If you are 40 or older,  have a clinician do a breast exam (clinical breast exam or CBE) every year. Depending on your age, family history, and medical history, it may be recommended that you also have a yearly breast X-ray (mammogram).  If you  have a family history of breast cancer, talk with your health care provider about genetic screening.  If you are at high risk for breast cancer, talk with your health care provider about having an MRI and a mammogram every year.  Breast cancer (BRCA) gene test is recommended for women who have family members with BRCA-related cancers. Results of the assessment will determine the need for genetic counseling and BRCA1 and for BRCA2 testing. BRCA-related cancers include these types:  Breast. This occurs in males or females.  Ovarian.  Tubal. This may also be called fallopian tube cancer.  Cancer of the abdominal or pelvic lining (peritoneal cancer).  Prostate.  Pancreatic. Cervical, Uterine, and Ovarian Cancer  Your health care provider may recommend that you be screened regularly for cancer of the pelvic organs. These include your ovaries, uterus, and vagina. This screening involves a pelvic exam, which includes checking for microscopic changes to the surface of your cervix (Pap test).  For women ages 21-65, health care providers may recommend a pelvic exam and a Pap test every three years. For women ages 23-65, they may recommend the Pap test and pelvic exam, combined with testing for human papilloma virus (HPV), every five years. Some types of HPV increase your risk of cervical cancer. Testing for HPV may also be done on women of any age who have unclear Pap test results.  Other health care providers may not recommend any screening for nonpregnant women who are considered low risk for pelvic cancer and have no symptoms. Ask your health care provider if a screening pelvic exam is right for you.  If you have had past treatment for cervical cancer or a condition that could lead to cancer, you need Pap tests and screening for cancer for at least 20 years after your treatment. If Pap tests have been discontinued for you, your risk factors (such as having a new sexual partner) need to be reassessed  to determine if you should start having screenings again. Some women have medical problems that increase the chance of getting cervical cancer. In these cases, your health care provider may recommend that you have screening and Pap tests more often.  If you have a family history of uterine cancer or ovarian cancer, talk with your health care provider about genetic screening.  If you have vaginal bleeding after reaching menopause, tell your health care provider.  There are currently no reliable tests available to screen for ovarian cancer. Lung Cancer  Lung cancer screening is recommended for adults 99-83 years old who are at high risk for lung cancer because of a history of smoking. A yearly low-dose CT scan of the lungs is recommended if you:  Currently smoke.  Have a history of at least 30 pack-years of smoking and you currently smoke or have quit within the past 15 years. A pack-year is smoking an average of one pack of cigarettes per day for one year. Yearly screening should:  Continue until it has been 15 years since you quit.  Stop if you develop a health problem that would prevent you from having lung cancer treatment. Colorectal Cancer  This type of cancer can be detected and can often be prevented.  Routine colorectal cancer screening usually begins at age 72 and continues  through age 75.  If you have risk factors for colon cancer, your health care provider may recommend that you be screened at an earlier age.  If you have a family history of colorectal cancer, talk with your health care provider about genetic screening.  Your health care provider may also recommend using home test kits to check for hidden blood in your stool.  A small camera at the end of a tube can be used to examine your colon directly (sigmoidoscopy or colonoscopy). This is done to check for the earliest forms of colorectal cancer.  Direct examination of the colon should be repeated every 5-10 years until  age 75. However, if early forms of precancerous polyps or small growths are found or if you have a family history or genetic risk for colorectal cancer, you may need to be screened more often. Skin Cancer  Check your skin from head to toe regularly.  Monitor any moles. Be sure to tell your health care provider:  About any new moles or changes in moles, especially if there is a change in a mole's shape or color.  If you have a mole that is larger than the size of a pencil eraser.  If any of your family members has a history of skin cancer, especially at a young age, talk with your health care provider about genetic screening.  Always use sunscreen. Apply sunscreen liberally and repeatedly throughout the day.  Whenever you are outside, protect yourself by wearing long sleeves, pants, a wide-brimmed hat, and sunglasses. What should I know about osteoporosis? Osteoporosis is a condition in which bone destruction happens more quickly than new bone creation. After menopause, you may be at an increased risk for osteoporosis. To help prevent osteoporosis or the bone fractures that can happen because of osteoporosis, the following is recommended:  If you are 19-50 years old, get at least 1,000 mg of calcium and at least 600 mg of vitamin D per day.  If you are older than age 50 but younger than age 70, get at least 1,200 mg of calcium and at least 600 mg of vitamin D per day.  If you are older than age 70, get at least 1,200 mg of calcium and at least 800 mg of vitamin D per day. Smoking and excessive alcohol intake increase the risk of osteoporosis. Eat foods that are rich in calcium and vitamin D, and do weight-bearing exercises several times each week as directed by your health care provider. What should I know about how menopause affects my mental health? Depression may occur at any age, but it is more common as you become older. Common symptoms of depression include:  Low or sad  mood.  Changes in sleep patterns.  Changes in appetite or eating patterns.  Feeling an overall lack of motivation or enjoyment of activities that you previously enjoyed.  Frequent crying spells. Talk with your health care provider if you think that you are experiencing depression. What should I know about immunizations? It is important that you get and maintain your immunizations. These include:  Tetanus, diphtheria, and pertussis (Tdap) booster vaccine.  Influenza every year before the flu season begins.  Pneumonia vaccine.  Shingles vaccine. Your health care provider may also recommend other immunizations. This information is not intended to replace advice given to you by your health care provider. Make sure you discuss any questions you have with your health care provider. Document Released: 10/10/2005 Document Revised: 03/07/2016 Document Reviewed: 05/22/2015 Elsevier Interactive Patient   Education  2017 Elsevier Inc.  

## 2017-01-09 ENCOUNTER — Telehealth: Payer: Self-pay

## 2017-01-09 LAB — CBC WITH DIFFERENTIAL/PLATELET
Basophils Absolute: 0 10*3/uL (ref 0.0–0.2)
Basos: 1 %
EOS (ABSOLUTE): 0.1 10*3/uL (ref 0.0–0.4)
EOS: 1 %
HEMATOCRIT: 41.4 % (ref 34.0–46.6)
Hemoglobin: 13.8 g/dL (ref 11.1–15.9)
IMMATURE GRANULOCYTES: 0 %
Immature Grans (Abs): 0 10*3/uL (ref 0.0–0.1)
LYMPHS ABS: 1.6 10*3/uL (ref 0.7–3.1)
Lymphs: 26 %
MCH: 29.2 pg (ref 26.6–33.0)
MCHC: 33.3 g/dL (ref 31.5–35.7)
MCV: 88 fL (ref 79–97)
MONOS ABS: 0.5 10*3/uL (ref 0.1–0.9)
Monocytes: 9 %
NEUTROS PCT: 63 %
Neutrophils Absolute: 3.9 10*3/uL (ref 1.4–7.0)
PLATELETS: 259 10*3/uL (ref 150–379)
RBC: 4.72 x10E6/uL (ref 3.77–5.28)
RDW: 13.9 % (ref 12.3–15.4)
WBC: 6.1 10*3/uL (ref 3.4–10.8)

## 2017-01-09 LAB — COMPREHENSIVE METABOLIC PANEL
A/G RATIO: 2.2 (ref 1.2–2.2)
ALK PHOS: 58 IU/L (ref 39–117)
ALT: 18 IU/L (ref 0–32)
AST: 14 IU/L (ref 0–40)
Albumin: 4.4 g/dL (ref 3.6–4.8)
BILIRUBIN TOTAL: 0.5 mg/dL (ref 0.0–1.2)
BUN/Creatinine Ratio: 24 (ref 12–28)
BUN: 17 mg/dL (ref 8–27)
CALCIUM: 9.4 mg/dL (ref 8.7–10.3)
CHLORIDE: 100 mmol/L (ref 96–106)
CO2: 28 mmol/L (ref 18–29)
Creatinine, Ser: 0.71 mg/dL (ref 0.57–1.00)
GFR calc Af Amer: 100 mL/min/{1.73_m2} (ref >59)
GFR, EST NON AFRICAN AMERICAN: 87 mL/min/{1.73_m2} (ref >59)
GLOBULIN, TOTAL: 2 g/dL (ref 1.5–4.5)
Glucose: 86 mg/dL (ref 65–99)
POTASSIUM: 4.5 mmol/L (ref 3.5–5.2)
SODIUM: 139 mmol/L (ref 134–144)
Total Protein: 6.4 g/dL (ref 6.0–8.5)

## 2017-01-09 LAB — HEMOGLOBIN A1C
Est. average glucose Bld gHb Est-mCnc: 111 mg/dL
Hgb A1c MFr Bld: 5.5 % (ref 4.8–5.6)

## 2017-01-09 LAB — LIPID PANEL
CHOL/HDL RATIO: 3 ratio (ref 0.0–4.4)
CHOLESTEROL TOTAL: 163 mg/dL (ref 100–199)
HDL: 55 mg/dL (ref >39)
LDL Calculated: 94 mg/dL (ref 0–99)
TRIGLYCERIDES: 68 mg/dL (ref 0–149)
VLDL Cholesterol Cal: 14 mg/dL (ref 5–40)

## 2017-01-09 LAB — HEPATITIS C ANTIBODY: Hep C Virus Ab: <0.1 s/co ratio (ref 0.0–0.9)

## 2017-01-09 NOTE — Telephone Encounter (Signed)
LMTCB  Thanks,  -Tondra Reierson 

## 2017-01-09 NOTE — Telephone Encounter (Signed)
-----   Message from Mar Daring, [place] sent at 01/09/2017 11:10 AM EDT ----- All labs are within normal limits and stable.  Thanks! -JB

## 2017-01-09 NOTE — Telephone Encounter (Signed)
Pt informed and voiced understanding of results. 

## 2017-01-12 ENCOUNTER — Telehealth: Payer: Self-pay

## 2017-01-12 NOTE — Telephone Encounter (Signed)
Patient advised as directed below.  Thanks,  -Davelyn Gwinn 

## 2017-01-12 NOTE — Telephone Encounter (Signed)
-----   Message from Mar Daring, PA-C sent at 01/12/2017  8:18 AM EDT ----- All labs are within normal limits and stable.  Thanks! -JB

## 2017-01-12 NOTE — Telephone Encounter (Signed)
LMTCB  Thanks,  -Joseline 

## 2017-01-22 ENCOUNTER — Other Ambulatory Visit: Payer: Self-pay | Admitting: Physician Assistant

## 2017-01-22 DIAGNOSIS — E785 Hyperlipidemia, unspecified: Secondary | ICD-10-CM

## 2017-01-22 NOTE — Telephone Encounter (Signed)
Pharmacy request refill. This is Kristen Hays's patient.

## 2017-01-27 ENCOUNTER — Ambulatory Visit
Admission: RE | Admit: 2017-01-27 | Discharge: 2017-01-27 | Disposition: A | Discharge status: Home / Self Care | Payer: Medicare Other | Source: Ambulatory Visit | Attending: Physician Assistant | Admitting: Physician Assistant

## 2017-01-27 DIAGNOSIS — Z1231 Encounter for screening mammogram for malignant neoplasm of breast: Secondary | ICD-10-CM

## 2017-01-28 ENCOUNTER — Telehealth: Payer: Self-pay

## 2017-01-28 NOTE — Telephone Encounter (Signed)
Left message to call back  

## 2017-01-28 NOTE — Telephone Encounter (Signed)
Advised patient of results.  

## 2017-01-28 NOTE — Telephone Encounter (Signed)
-----   Message from Mar Daring, PA-C sent at 01/27/2017  7:22 PM EDT ----- Normal mammogram. Repeat screening in one year.

## 2017-07-08 ENCOUNTER — Other Ambulatory Visit: Payer: Self-pay | Admitting: Physician Assistant

## 2017-07-08 DIAGNOSIS — F320 Major depressive disorder, single episode, mild: Secondary | ICD-10-CM

## 2017-10-23 ENCOUNTER — Encounter: Payer: Self-pay | Admitting: Physician Assistant

## 2017-10-23 ENCOUNTER — Ambulatory Visit (INDEPENDENT_AMBULATORY_CARE_PROVIDER_SITE_OTHER): Payer: PPO | Admitting: Physician Assistant

## 2017-10-23 VITALS — BP 136/80 | HR 76 | Temp 97.6°F | Resp 16 | Ht 62.0 in | Wt 136.2 lb

## 2017-10-23 DIAGNOSIS — R0789 Other chest pain: Secondary | ICD-10-CM

## 2017-10-23 DIAGNOSIS — Z9889 Other specified postprocedural states: Secondary | ICD-10-CM | POA: Diagnosis not present

## 2017-10-23 DIAGNOSIS — F321 Major depressive disorder, single episode, moderate: Secondary | ICD-10-CM | POA: Diagnosis not present

## 2017-10-23 DIAGNOSIS — G8918 Other acute postprocedural pain: Secondary | ICD-10-CM

## 2017-10-23 DIAGNOSIS — Z9011 Acquired absence of right breast and nipple: Secondary | ICD-10-CM

## 2017-10-23 MED ORDER — ESCITALOPRAM OXALATE 10 MG PO TABS: 10.0000 mg | ORAL_TABLET | Freq: Every day | ORAL | 1 refills | Status: DC

## 2017-10-23 NOTE — Progress Notes (Signed)
Patient: Kristen Hays Female    DOB: Sep 29, 1945   72 y.o.   MRN: [id number] Visit Date: 10/23/2017  Today's Provider: Mar Daring, PA-C   Chief Complaint  Patient presents with  . Depression   Subjective:    HPI  Depression, Follow-up  She  was last seen for this 9 months ago. Changes made at last visit include increase Prozac to 40 mg.   She reports excellent compliance with treatment. She is not having side effects.   She reports excellent tolerance of treatment. Current symptoms include: feelings of worthlessness/guilt, hypersomnia and feels like every one at work talks about her laughs at her. She feels she is Unchanged since last visit.  ------------------------------------------------------------------------  Patient C/O right pain around chest area and right arm x's 2 months. Patient did have mastectomy on right side. Patient denies any rash, redness or swelling. Patient reports she takes Advil at times and reports mild pain control.     No Known Allergies   Current Outpatient Medications:  .  atorvastatin (LIPITOR) 10 MG tablet, TAKE 1 TABLET BY MOUTH AT BEDTIME, Disp: 90 tablet, Rfl: 1 .  Cholecalciferol (VITAMIN D) 2000 UNITS CAPS, Take 1 capsule by mouth daily., Disp: , Rfl:  .  FLUoxetine (PROZAC) 40 MG capsule, TAKE 1 CAPSULE BY MOUTH EVERY DAY, Disp: 90 capsule, Rfl: 1 .  omeprazole (PRILOSEC) 20 MG capsule, Take 20 mg by mouth daily., Disp: , Rfl:   Review of Systems  Constitutional: Negative.   Respiratory: Positive for choking and chest tightness.   Cardiovascular: Negative.   Gastrointestinal: Negative.   Musculoskeletal: Positive for myalgias, neck pain and neck stiffness.  Neurological: Negative.   Psychiatric/Behavioral: The patient is nervous/anxious.     Social History   Tobacco Use  . Smoking status: Never Smoker  . Smokeless tobacco: Never Used  Substance Use Topics  . Alcohol use: No   Objective:   BP 136/80 (BP  Location: Left Arm, Patient Position: Sitting, Cuff Size: Normal)   Pulse 76   Temp 97.6 F (36.4 C) (Oral)   Resp 16   Ht 5\' 2"  (1.575 m)   Wt 136 lb 3.2 oz (61.8 kg)   SpO2 99%   BMI 24.91 kg/m  Vitals:   10/23/17 1335  BP: 136/80  Pulse: 76  Resp: 16  Temp: 97.6 F (36.4 C)  TempSrc: Oral  SpO2: 99%  Weight: 136 lb 3.2 oz (61.8 kg)  Height: 5\' 2"  (1.575 m)     Physical Exam  Constitutional: She appears well-developed and well-nourished. No distress.  Neck: Normal range of motion. Neck supple.  Cardiovascular: Normal rate, regular rhythm and normal heart sounds. Exam reveals no gallop and no friction rub.  No murmur heard. Pulmonary/Chest: Effort normal and breath sounds normal. No respiratory distress. She has no wheezes. She has no rales. Right breast exhibits tenderness. Right breast exhibits no inverted nipple, no mass and no nipple discharge. Left breast exhibits no inverted nipple, no mass, no nipple discharge, no skin change and no tenderness. Breasts are symmetrical.    Lymphadenopathy:    She has no cervical adenopathy.  Skin: She is not diaphoretic.  Psychiatric: Her speech is normal and behavior is normal. Judgment and thought content normal. Her mood appears anxious. Cognition and memory are normal. She exhibits a depressed mood.  Vitals reviewed.       Assessment & Plan:     1. Depression, major, single episode, moderate ([place]) Patient  has not been taking fluoxetine 40mg . Has been off for about 2-3 months. Will change therapy to lexapro as below. I will see her back in 4 weeks.  - escitalopram (LEXAPRO) 10 MG tablet; Take 1 tablet (10 mg total) by mouth at bedtime. Start with 1/2 tab PO q hs x 1 week, then increase to 1 tab PO qhs  Dispense: 30 tablet; Refill: 1  2. Chest wall pain following surgery No abnormalities felt. Discussed therapy but she declines. Offered referral back to plastic surgeon and she states "I will think about it and call if I do."  Continue IBU prn for now. Warm compresses may help also.   3. S/P mastectomy, right See above medical treatment plan.  4. S/P breast reconstruction, right See above medical treatment plan.       Mar Daring, PA-C  [facility]

## 2017-10-23 NOTE — Patient Instructions (Signed)
Escitalopram tablets What is this medicine? ESCITALOPRAM (es sye TAL oh pram) is used to treat depression and certain types of anxiety. This medicine may be used for other purposes; ask your health care provider or pharmacist if you have questions. COMMON BRAND NAME(S): Lexapro What should I tell my health care provider before I take this medicine? They need to know if you have any of these conditions: -bipolar disorder or a family history of bipolar disorder -diabetes -glaucoma -heart disease -kidney or liver disease -receiving electroconvulsive therapy -seizures (convulsions) -suicidal thoughts, plans, or attempt by you or a family member -an unusual or allergic reaction to escitalopram, the related drug citalopram, other medicines, foods, dyes, or preservatives -pregnant or trying to become pregnant -breast-feeding How should I use this medicine? Take this medicine by mouth with a glass of water. Follow the directions on the prescription label. You can take it with or without food. If it upsets your stomach, take it with food. Take your medicine at regular intervals. Do not take it more often than directed. Do not stop taking this medicine suddenly except upon the advice of your doctor. Stopping this medicine too quickly may cause serious side effects or your condition may worsen. A special MedGuide will be given to you by the pharmacist with each prescription and refill. Be sure to read this information carefully each time. Talk to your pediatrician regarding the use of this medicine in children. Special care may be needed. Overdosage: If you think you have taken too much of this medicine contact a poison control center or emergency room at once. NOTE: This medicine is only for you. Do not share this medicine with others. What if I miss a dose? If you miss a dose, take it as soon as you can. If it is almost time for your next dose, take only that dose. Do not take double or extra  doses. What may interact with this medicine? Do not take this medicine with any of the following medications: -certain medicines for fungal infections like fluconazole, itraconazole, ketoconazole, posaconazole, voriconazole -cisapride -citalopram -dofetilide -dronedarone -linezolid -MAOIs like Carbex, Eldepryl, Marplan, Nardil, and Parnate -methylene blue (injected into a vein) -pimozide -thioridazine -ziprasidone This medicine may also interact with the following medications: -alcohol -amphetamines -aspirin and aspirin-like medicines -carbamazepine -certain medicines for depression, anxiety, or psychotic disturbances -certain medicines for migraine headache like almotriptan, eletriptan, frovatriptan, naratriptan, rizatriptan, sumatriptan, zolmitriptan -certain medicines for sleep -certain medicines that treat or prevent blood clots like warfarin, enoxaparin, dalteparin -cimetidine -diuretics -fentanyl -furazolidone -isoniazid -lithium -metoprolol -NSAIDs, medicines for pain and inflammation, like ibuprofen or naproxen -other medicines that prolong the QT interval (cause an abnormal heart rhythm) -procarbazine -rasagiline -supplements like St. John's wort, kava kava, valerian -tramadol -tryptophan This list may not describe all possible interactions. Give your health care provider a list of all the medicines, herbs, non-prescription drugs, or dietary supplements you use. Also tell them if you smoke, drink alcohol, or use illegal drugs. Some items may interact with your medicine. What should I watch for while using this medicine? Tell your doctor if your symptoms do not get better or if they get worse. Visit your doctor or health care professional for regular checks on your progress. Because it may take several weeks to see the full effects of this medicine, it is important to continue your treatment as prescribed by your doctor. Patients and their families should watch out for  new or worsening thoughts of suicide or depression. Also watch out for   sudden changes in feelings such as feeling anxious, agitated, panicky, irritable, hostile, aggressive, impulsive, severely restless, overly excited and hyperactive, or not being able to sleep. If this happens, especially at the beginning of treatment or after a change in dose, call your health care professional. You may get drowsy or dizzy. Do not drive, use machinery, or do anything that needs mental alertness until you know how this medicine affects you. Do not stand or sit up quickly, especially if you are an older patient. This reduces the risk of dizzy or fainting spells. Alcohol may interfere with the effect of this medicine. Avoid alcoholic drinks. Your mouth may get dry. Chewing sugarless gum or sucking hard candy, and drinking plenty of water may help. Contact your doctor if the problem does not go away or is severe. What side effects may I notice from receiving this medicine? Side effects that you should report to your doctor or health care professional as soon as possible: -allergic reactions like skin rash, itching or hives, swelling of the face, lips, or tongue -anxious -black, tarry stools -changes in vision -confusion -elevated mood, decreased need for sleep, racing thoughts, impulsive behavior -eye pain -fast, irregular heartbeat -feeling faint or lightheaded, falls -feeling agitated, angry, or irritable -hallucination, loss of contact with reality -loss of balance or coordination -loss of memory -painful or prolonged erections -restlessness, pacing, inability to keep still -seizures -stiff muscles -suicidal thoughts or other mood changes -trouble sleeping -unusual bleeding or bruising -unusually weak or tired -vomiting Side effects that usually do not require medical attention (report to your doctor or health care professional if they continue or are bothersome): -changes in appetite -change in sex  drive or performance -headache -increased sweating -indigestion, nausea -tremors This list may not describe all possible side effects. Call your doctor for medical advice about side effects. You may report side effects to FDA at 1-800-FDA-1088. Where should I keep my medicine? Keep out of reach of children. Store at room temperature between 15 and 30 degrees C (59 and 86 degrees F). Throw away any unused medicine after the expiration date. NOTE: This sheet is a summary. It may not cover all possible information. If you have questions about this medicine, talk to your doctor, pharmacist, or health care provider.  2018 Elsevier/Gold Standard (2016-01-21 13:20:23)  

## 2017-11-06 ENCOUNTER — Other Ambulatory Visit: Payer: Self-pay | Admitting: Family Medicine

## 2017-11-06 DIAGNOSIS — E785 Hyperlipidemia, unspecified: Secondary | ICD-10-CM

## 2017-11-06 MED ORDER — ATORVASTATIN CALCIUM 10 MG PO TABS: 10.0000 mg | ORAL_TABLET | Freq: Every day | ORAL | 1 refills | Status: DC

## 2017-11-06 NOTE — Telephone Encounter (Signed)
[facility] pharmacy faxed a refill request for a 90-days supply for the following medication. Thanks CC  atorvastatin (LIPITOR) 10 MG tablet

## 2017-11-16 ENCOUNTER — Other Ambulatory Visit: Payer: Self-pay | Admitting: Physician Assistant

## 2017-11-16 DIAGNOSIS — F321 Major depressive disorder, single episode, moderate: Secondary | ICD-10-CM

## 2017-11-16 MED ORDER — ESCITALOPRAM OXALATE 10 MG PO TABS: 10.0000 mg | ORAL_TABLET | Freq: Every day | ORAL | 1 refills | Status: DC

## 2017-11-16 NOTE — Telephone Encounter (Signed)
Please review. Thanks!  

## 2017-11-16 NOTE — Telephone Encounter (Signed)
[facility] pharmacy faxed a refill request for a 90-days supply for the following medication. Thanks CC  escitalopram (LEXAPRO) 10 MG tablet

## 2017-11-23 ENCOUNTER — Ambulatory Visit: Payer: Self-pay | Admitting: Physician Assistant

## 2017-12-14 ENCOUNTER — Telehealth: Payer: Self-pay | Admitting: Physician Assistant

## 2017-12-14 DIAGNOSIS — K2270 Barrett's esophagus without dysplasia: Secondary | ICD-10-CM

## 2017-12-14 NOTE — Telephone Encounter (Signed)
Referral placed for GI, Dr. Vira Agar for EGD for Barrett esophagus

## 2017-12-14 NOTE — Telephone Encounter (Signed)
Please review. Thanks!  

## 2017-12-14 NOTE — Telephone Encounter (Signed)
Pt advised.

## 2017-12-14 NOTE — Telephone Encounter (Signed)
Patient states that she got a letter from [facility] (Dr. Vira Agar) saying that it is time for her to have a endoscopy and she needs a referral or approval from her PCP due to insurance.

## 2017-12-17 DIAGNOSIS — K2270 Barrett's esophagus without dysplasia: Secondary | ICD-10-CM | POA: Diagnosis not present

## 2017-12-17 DIAGNOSIS — K5909 Other constipation: Secondary | ICD-10-CM | POA: Insufficient documentation

## 2017-12-17 DIAGNOSIS — K5904 Chronic idiopathic constipation: Secondary | ICD-10-CM | POA: Diagnosis not present

## 2017-12-17 DIAGNOSIS — R1319 Other dysphagia: Secondary | ICD-10-CM | POA: Insufficient documentation

## 2017-12-28 ENCOUNTER — Other Ambulatory Visit: Payer: Self-pay | Admitting: Physician Assistant

## 2017-12-28 DIAGNOSIS — Z1231 Encounter for screening mammogram for malignant neoplasm of breast: Secondary | ICD-10-CM

## 2018-01-12 ENCOUNTER — Ambulatory Visit (INDEPENDENT_AMBULATORY_CARE_PROVIDER_SITE_OTHER): Payer: PPO | Admitting: Physician Assistant

## 2018-01-12 ENCOUNTER — Encounter: Payer: Self-pay | Admitting: Physician Assistant

## 2018-01-12 VITALS — BP 150/80 | HR 68 | Temp 98.1°F | Resp 16 | Ht 62.0 in | Wt 136.0 lb

## 2018-01-12 DIAGNOSIS — Z1231 Encounter for screening mammogram for malignant neoplasm of breast: Secondary | ICD-10-CM

## 2018-01-12 DIAGNOSIS — K2270 Barrett's esophagus without dysplasia: Secondary | ICD-10-CM | POA: Diagnosis not present

## 2018-01-12 DIAGNOSIS — E7800 Pure hypercholesterolemia, unspecified: Secondary | ICD-10-CM | POA: Diagnosis not present

## 2018-01-12 DIAGNOSIS — Z1239 Encounter for other screening for malignant neoplasm of breast: Secondary | ICD-10-CM

## 2018-01-12 DIAGNOSIS — F320 Major depressive disorder, single episode, mild: Secondary | ICD-10-CM

## 2018-01-12 DIAGNOSIS — Z0000 Encounter for general adult medical examination without abnormal findings: Secondary | ICD-10-CM

## 2018-01-12 DIAGNOSIS — Z853 Personal history of malignant neoplasm of breast: Secondary | ICD-10-CM

## 2018-01-12 DIAGNOSIS — Z86010 Personal history of colonic polyps: Secondary | ICD-10-CM

## 2018-01-12 DIAGNOSIS — Z1211 Encounter for screening for malignant neoplasm of colon: Secondary | ICD-10-CM

## 2018-01-12 DIAGNOSIS — Z9011 Acquired absence of right breast and nipple: Secondary | ICD-10-CM

## 2018-01-12 NOTE — Progress Notes (Signed)
Patient: Kristen Hays, Female    DOB: 12-23-1945, 72 y.o.   MRN: [id number] Visit Date: 01/12/2018  Today's Provider: Mar Daring, PA-C   Chief Complaint  Patient presents with  . Medicare Wellness   Subjective:    Annual wellness visit Kristen Hays is a 72 y.o. female. She feels well. She reports exercising 3 days a week. She reports she is sleeping well.  01/08/17 CPE 01/07/16 Pap-neg; HPV-neg 01/27/17 Mammogram-BI-RADS 1 01/17/15 Colonoscopy-diverticulosis, internal hemorrhoids, tubular adenomas; repeat in 3 years 01/23/16 BMD-Osteopenia -----------------------------------------------------------   Review of Systems  Constitutional: Negative.   HENT: Positive for sneezing.   Eyes: Negative.   Respiratory: Negative.   Cardiovascular: Negative.   Gastrointestinal: Negative.   Endocrine: Negative.   Genitourinary: Negative.   Musculoskeletal: Negative.   Skin: Negative.   Allergic/Immunologic: Positive for environmental allergies.  Neurological: Negative.   Hematological: Negative.   Psychiatric/Behavioral: Negative.     Social History   Socioeconomic History  . Marital status: Divorced    Spouse name: Not on file  . Number of children: 2  . Years of education: HS  . Highest education level: Not on file  Occupational History    Employer: [employer]  Social Needs  . Financial resource strain: Not on file  . Food insecurity:    Worry: Not on file    Inability: Not on file  . Transportation needs:    Medical: Not on file    Non-medical: Not on file  Tobacco Use  . Smoking status: Never Smoker  . Smokeless tobacco: Never Used  Substance and Sexual Activity  . Alcohol use: No  . Drug use: No  . Sexual activity: Yes    Birth control/protection: Post-menopausal  Lifestyle  . Physical activity:    Days per week: Not on file    Minutes per session: Not on file  . Stress: Not on file  Relationships  . Social  connections:    Talks on phone: Not on file    Gets together: Not on file    Attends religious service: Not on file    Active member of club or organization: Not on file    Attends meetings of clubs or organizations: Not on file    Relationship status: Not on file  . Intimate partner violence:    Fear of current or ex partner: Not on file    Emotionally abused: Not on file    Physically abused: Not on file    Forced sexual activity: Not on file  Other Topics Concern  . Not on file  Social History Narrative  . Not on file    Past Medical History:  Diagnosis Date  . Anxiety   . Breast cancer ([place])    right breast c Mastectomy   . Dysphagia   . High cholesterol      Patient Active Problem List   Diagnosis Date Noted  . Abnormal breast exam 01/08/2017  . History of cancer of right breast 01/08/2017  . S/P mastectomy, right 01/08/2017  . Depression, major, single episode, mild ([place]) 01/08/2017  . Dermatitis 02/26/2016  . Tarsal tunnel syndrome of right side 06/21/2015  . Anxiety 05/18/2015  . Globus hystericus 05/18/2015  . Blood in the urine 05/18/2015  . 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 HLD (hyperlipidemia) 05/18/2015  . LBP (low back pain) 05/18/2015  . 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 Cervical pain 05/18/2015  . Barrett esophagus 03/04/2015  .  H/O adenomatous polyp of colon 03/04/2015  . Abdominal bloating 12/28/2014    Past Surgical History:  Procedure Laterality Date  . AUGMENTATION MAMMAPLASTY Bilateral   . BREAST SURGERY     mastectomy-right; breast augmentation  . CATARACT EXTRACTION W/PHACO Left 07/21/2016   Procedure: CATARACT EXTRACTION PHACO AND INTRAOCULAR LENS PLACEMENT (IOC);  Surgeon: Ronnell Freshwater, MD;  Location: [facility];  Service: Ophthalmology;  Laterality: Left;  LEFT  . CESAREAN SECTION     1974/1979  . COLONOSCOPY N/A 01/17/2015   Procedure: COLONOSCOPY;  Surgeon: Manya Silvas, MD;  Location[facility] ENDOSCOPY;   Service: Endoscopy;  Laterality: N/A;  . ESOPHAGOGASTRODUODENOSCOPY N/A 01/17/2015   Procedure: ESOPHAGOGASTRODUODENOSCOPY (EGD);  Surgeon: Manya Silvas, MD;  Location: [facility] ENDOSCOPY;  Service: Endoscopy;  Laterality: N/A;  . MASTECTOMY Right 2011  . SAVORY DILATION N/A 01/17/2015   Procedure: SAVORY DILATION;  Surgeon: Manya Silvas, MD;  Location: [facility] ENDOSCOPY;  Service: Endoscopy;  Laterality: N/A;  . TONSILLECTOMY  2011    Her family history includes Alzheimer's disease in her mother; Bone cancer in her father; Breast cancer in her cousin, paternal aunt, and sister; Cancer in her sister; Diabetes in her mother and sister; Healthy in her brother and sister; Irritable bowel syndrome in her sister; Stroke in her mother; Vascular Disease in her father.      Current Outpatient Medications:  .  atorvastatin (LIPITOR) 10 MG tablet, Take 1 tablet (10 mg total) by mouth at bedtime., Disp: 90 tablet, Rfl: 1 .  Cholecalciferol (VITAMIN D) 2000 UNITS CAPS, Take 1 capsule by mouth daily., Disp: , Rfl:  .  escitalopram (LEXAPRO) 10 MG tablet, Take 1 tablet (10 mg total) by mouth at bedtime. Start with 1/2 tab PO q hs x 1 week, then increase to 1 tab PO qhs, Disp: 90 tablet, Rfl: 1 .  omeprazole (PRILOSEC) 20 MG capsule, Take 20 mg by mouth daily., Disp: , Rfl:   Patient Care Team: Mar Daring, PA-C as PCP - General (Family Medicine)     Objective:   Vitals: BP (!) 150/80 (BP Location: Left Arm, Patient Position: Sitting, Cuff Size: Normal)   Pulse 68   Temp 98.1 F (36.7 C) (Oral)   Resp 16   Ht 5\' 2"  (1.575 m)   Wt 136 lb (61.7 kg)   SpO2 98%   BMI 24.87 kg/m   Physical Exam  Constitutional: She is oriented to person, place, and time. She appears well-developed and well-nourished. No distress.  HENT:  Head: Normocephalic and atraumatic.  Right Ear: Hearing, tympanic membrane, external ear and ear canal normal.  Left Ear: Hearing, tympanic membrane, external ear and ear  canal normal.  Nose: Nose normal.  Mouth/Throat: Uvula is midline, oropharynx is clear and moist and mucous membranes are normal. No oropharyngeal exudate.  Eyes: Pupils are equal, round, and reactive to light. Conjunctivae and EOM are normal. Right eye exhibits no discharge. Left eye exhibits no discharge. No scleral icterus.  Neck: Normal range of motion. Neck supple. No JVD present. No tracheal deviation present. No thyromegaly present.  Cardiovascular: Normal rate, regular rhythm, normal heart sounds and intact distal pulses. Exam reveals no gallop and no friction rub.  No murmur heard. Pulmonary/Chest: Effort normal and breath sounds normal. No respiratory distress. She has no wheezes. She has no rales. She exhibits no tenderness. Right breast exhibits no inverted nipple, no mass, no nipple discharge, no skin change and no tenderness. Left breast exhibits no inverted nipple,  no mass, no nipple discharge, no skin change and no tenderness. Breasts are asymmetrical.    Abdominal: Soft. Bowel sounds are normal. She exhibits no distension and no mass. There is no tenderness. There is no rebound and no guarding.  Musculoskeletal: Normal range of motion. She exhibits no edema or tenderness.  Lymphadenopathy:    She has no cervical adenopathy.  Neurological: She is alert and oriented to person, place, and time.  Skin: Skin is warm and dry. No rash noted. She is not diaphoretic.  Psychiatric: She has a normal mood and affect. Her behavior is normal. Judgment and thought content normal.  Vitals reviewed.   Activities of Daily Living In your present state of health, do you have any difficulty performing the following activities: 01/12/2018  Hearing? Y  Vision? N  Difficulty concentrating or making decisions? N  Walking or climbing stairs? N  Dressing or bathing? N  Doing errands, shopping? N  Some recent data might be hidden    Fall Risk Assessment Fall Risk  01/12/2018 01/08/2017 11/12/2016  01/07/2016  Falls in the past year? Yes No No No  Number falls in past yr: 2 or more - - -  Injury with Fall? No - - -     Depression Screen PHQ 2/9 Scores 01/12/2018 10/23/2017 01/08/2017 11/12/2016  PHQ - 2 Score 1 1 4 6   PHQ- 9 Score 4 7 11 14     Cognitive Testing - 6-CIT  Correct? Score   What year is it? yes 0 0 or 4  What month is it? yes 0 0 or 3  Memorize:    Pia Mau,  43,  [address],  [place],      What time is it? (within 1 hour) yes 0 0 or 3  Count backwards from 20 yes 2 0, 2, or 4  Name the months of the year yes 0 0, 2, or 4  Repeat name & address above no 2 0, 2, 4, 6, 8, or 10       TOTAL SCORE  4/28   Interpretation:  Normal  Normal (0-7) Abnormal (8-28)       Assessment & Plan:     Annual Wellness Visit  Reviewed patient's Family Medical History Reviewed and updated list of patient's medical providers Assessment of cognitive impairment was done Assessed patient's functional ability Established a written schedule for health screening [place] Completed and Reviewed  Exercise Activities and Dietary recommendations Goals    None      Immunization History  Administered Date(s) Administered  . Influenza, High Dose Seasonal PF 06/06/2015  . Pneumococcal Conjugate-13 12/09/2013  . Pneumococcal Polysaccharide-23 01/07/2016    Health Maintenance  Topic Date Due  . TETANUS/TDAP  05/03/1966  . COLONOSCOPY  01/16/2018  . INFLUENZA VACCINE  04/01/2018  . MAMMOGRAM  01/28/2019  . DEXA SCAN  Completed  . Hepatitis C Screening  Completed  . PNA vac Low Risk Adult  Completed     Discussed health benefits of physical activity, and encouraged her to engage in regular exercise appropriate for her age and condition.    1. Initial Medicare annual wellness visit EKG today shows Sinus bradycardia with rate of 59 and low voltage. Essentially unchanged from ECG done on 08/14/14. Up to date on all vaccinations.  - EKG 12-Lead  2.  Colon cancer screening Is to repeat colonoscopy in 3 years due to Tubular adenomas. Has EGD scheduled already for June for Barrett's esophagus. Referral placed  to see if colonoscopy can be done at same time.  - Ambulatory referral to Gastroenterology  3. Breast cancer screening Breast exam today was normal compared to last year. There is personal history of breast cancer in her right breast. She does perform regular self breast exams. Mammogram was ordered as below. Information for [facility] was given to patient so she may schedule her mammogram at her convenience. - MM Digital Screening; Future  4. Barrett's esophagus without dysplasia See above medical treatment plan #2.  - CBC w/Diff/Platelet - Comprehensive Metabolic Panel (CMET)  5. H/O adenomatous polyp of colon See above medical treatment plan for #2.  - Comprehensive Metabolic Panel (CMET)  6. Pure hypercholesterolemia Stable on atorvastatin 10mg . Will check labs as below and f/u pending results. - CBC w/Diff/Platelet - Comprehensive Metabolic Panel (CMET) - Lipid Profile  7. S/P mastectomy, right See above medical treatment plan for #3.   8. History of cancer of right breast See above medical treatment plan for #3.   9. Depression, major, single episode, mild ([facility]) Improved with lexapro 10mg . Continue current dose.  - CBC w/Diff/Platelet - TSH  ------------------------------------------------------------------------------------------------------------    Mar Daring, PA-C  [facility]

## 2018-01-12 NOTE — Patient Instructions (Signed)
Health Maintenance for Postmenopausal Women Menopause is a normal process in which your reproductive ability comes to an end. This process happens gradually over a span of months to years, usually between the ages of 22 and 9. Menopause is complete when you have missed 12 consecutive menstrual periods. It is important to talk with your health care provider about some of the most common conditions that affect postmenopausal women, such as heart disease, cancer, and bone loss (osteoporosis). Adopting a healthy lifestyle and getting preventive care can help to promote your health and wellness. Those actions can also lower your chances of developing some of these common conditions. What should I know about menopause? During menopause, you may experience a number of symptoms, such as:  Moderate-to-severe hot flashes.  Night sweats.  Decrease in sex drive.  Mood swings.  Headaches.  Tiredness.  Irritability.  Memory problems.  Insomnia.  Choosing to treat or not to treat menopausal changes is an individual decision that you make with your health care provider. What should I know about hormone replacement therapy and supplements? Hormone therapy products are effective for treating symptoms that are associated with menopause, such as hot flashes and night sweats. Hormone replacement carries certain risks, especially as you become older. If you are thinking about using estrogen or estrogen with progestin treatments, discuss the benefits and risks with your health care provider. What should I know about heart disease and stroke? Heart disease, heart attack, and stroke become more likely as you age. This may be due, in part, to the hormonal changes that your body experiences during menopause. These can affect how your body processes dietary fats, triglycerides, and cholesterol. Heart attack and stroke are both medical emergencies. There are many things that you can do to help prevent heart disease  and stroke:  Have your blood pressure checked at least every 1-2 years. High blood pressure causes heart disease and increases the risk of stroke.  If you are 53-22 years old, ask your health care provider if you should take aspirin to prevent a heart attack or a stroke.  Do not use any tobacco products, including cigarettes, chewing tobacco, or electronic cigarettes. If you need help quitting, ask your health care provider.  It is important to eat a healthy diet and maintain a healthy weight. ? Be sure to include plenty of vegetables, fruits, low-fat dairy products, and lean protein. ? Avoid eating foods that are high in solid fats, added sugars, or salt (sodium).  Get regular exercise. This is one of the most important things that you can do for your health. ? Try to exercise for at least 150 minutes each week. The type of exercise that you do should increase your heart rate and make you sweat. This is known as moderate-intensity exercise. ? Try to do strengthening exercises at least twice each week. Do these in addition to the moderate-intensity exercise.  Know your numbers.Ask your health care provider to check your cholesterol and your blood glucose. Continue to have your blood tested as directed by your health care provider.  What should I know about cancer screening? There are several types of cancer. Take the following steps to reduce your risk and to catch any cancer development as early as possible. Breast Cancer  Practice breast self-awareness. ? This means understanding how your breasts normally appear and feel. ? It also means doing regular breast self-exams. Let your health care provider know about any changes, no matter how small.  If you are 40  or older, have a clinician do a breast exam (clinical breast exam or CBE) every year. Depending on your age, family history, and medical history, it may be recommended that you also have a yearly breast X-ray (mammogram).  If you  have a family history of breast cancer, talk with your health care provider about genetic screening.  If you are at high risk for breast cancer, talk with your health care provider about having an MRI and a mammogram every year.  Breast cancer (BRCA) gene test is recommended for women who have family members with BRCA-related cancers. Results of the assessment will determine the need for genetic counseling and BRCA1 and for BRCA2 testing. BRCA-related cancers include these types: ? Breast. This occurs in males or females. ? Ovarian. ? Tubal. This may also be called fallopian tube cancer. ? Cancer of the abdominal or pelvic lining (peritoneal cancer). ? Prostate. ? Pancreatic.  Cervical, Uterine, and Ovarian Cancer Your health care provider may recommend that you be screened regularly for cancer of the pelvic organs. These include your ovaries, uterus, and vagina. This screening involves a pelvic exam, which includes checking for microscopic changes to the surface of your cervix (Pap test).  For women ages 21-65, health care providers may recommend a pelvic exam and a Pap test every three years. For women ages 79-65, they may recommend the Pap test and pelvic exam, combined with testing for human papilloma virus (HPV), every five years. Some types of HPV increase your risk of cervical cancer. Testing for HPV may also be done on women of any age who have unclear Pap test results.  Other health care providers may not recommend any screening for nonpregnant women who are considered low risk for pelvic cancer and have no symptoms. Ask your health care provider if a screening pelvic exam is right for you.  If you have had past treatment for cervical cancer or a condition that could lead to cancer, you need Pap tests and screening for cancer for at least 20 years after your treatment. If Pap tests have been discontinued for you, your risk factors (such as having a new sexual partner) need to be  reassessed to determine if you should start having screenings again. Some women have medical problems that increase the chance of getting cervical cancer. In these cases, your health care provider may recommend that you have screening and Pap tests more often.  If you have a family history of uterine cancer or ovarian cancer, talk with your health care provider about genetic screening.  If you have vaginal bleeding after reaching menopause, tell your health care provider.  There are currently no reliable tests available to screen for ovarian cancer.  Lung Cancer Lung cancer screening is recommended for adults 69-62 years old who are at high risk for lung cancer because of a history of smoking. A yearly low-dose CT scan of the lungs is recommended if you:  Currently smoke.  Have a history of at least 30 pack-years of smoking and you currently smoke or have quit within the past 15 years. A pack-year is smoking an average of one pack of cigarettes per day for one year.  Yearly screening should:  Continue until it has been 15 years since you quit.  Stop if you develop a health problem that would prevent you from having lung cancer treatment.  Colorectal Cancer  This type of cancer can be detected and can often be prevented.  Routine colorectal cancer screening usually begins at  age 42 and continues through age 45.  If you have risk factors for colon cancer, your health care provider may recommend that you be screened at an earlier age.  If you have a family history of colorectal cancer, talk with your health care provider about genetic screening.  Your health care provider may also recommend using home test kits to check for hidden blood in your stool.  A small camera at the end of a tube can be used to examine your colon directly (sigmoidoscopy or colonoscopy). This is done to check for the earliest forms of colorectal cancer.  Direct examination of the colon should be repeated every  5-10 years until age 71. However, if early forms of precancerous polyps or small growths are found or if you have a family history or genetic risk for colorectal cancer, you may need to be screened more often.  Skin Cancer  Check your skin from head to toe regularly.  Monitor any moles. Be sure to tell your health care provider: ? About any new moles or changes in moles, especially if there is a change in a mole's shape or color. ? If you have a mole that is larger than the size of a pencil eraser.  If any of your family members has a history of skin cancer, especially at a young age, talk with your health care provider about genetic screening.  Always use sunscreen. Apply sunscreen liberally and repeatedly throughout the day.  Whenever you are outside, protect yourself by wearing long sleeves, pants, a wide-brimmed hat, and sunglasses.  What should I know about osteoporosis? Osteoporosis is a condition in which bone destruction happens more quickly than new bone creation. After menopause, you may be at an increased risk for osteoporosis. To help prevent osteoporosis or the bone fractures that can happen because of osteoporosis, the following is recommended:  If you are 46-71 years old, get at least 1,000 mg of calcium and at least 600 mg of vitamin D per day.  If you are older than age 55 but younger than age 65, get at least 1,200 mg of calcium and at least 600 mg of vitamin D per day.  If you are older than age 54, get at least 1,200 mg of calcium and at least 800 mg of vitamin D per day.  Smoking and excessive alcohol intake increase the risk of osteoporosis. Eat foods that are rich in calcium and vitamin D, and do weight-bearing exercises several times each week as directed by your health care provider. What should I know about how menopause affects my mental health? Depression may occur at any age, but it is more common as you become older. Common symptoms of depression  include:  Low or sad mood.  Changes in sleep patterns.  Changes in appetite or eating patterns.  Feeling an overall lack of motivation or enjoyment of activities that you previously enjoyed.  Frequent crying spells.  Talk with your health care provider if you think that you are experiencing depression. What should I know about immunizations? It is important that you get and maintain your immunizations. These include:  Tetanus, diphtheria, and pertussis (Tdap) booster vaccine.  Influenza every year before the flu season begins.  Pneumonia vaccine.  Shingles vaccine.  Your health care provider may also recommend other immunizations. This information is not intended to replace advice given to you by your health care provider. Make sure you discuss any questions you have with your health care provider. Document Released: 10/10/2005  Document Revised: 03/07/2016 Document Reviewed: 05/22/2015 Elsevier Interactive Patient Education  2018 [employer] Inc[employer]  

## 2018-01-13 ENCOUNTER — Telehealth: Payer: Self-pay | Admitting: Physician Assistant

## 2018-01-13 LAB — CBC WITH DIFFERENTIAL/PLATELET
BASOS: 1 %
Basophils Absolute: 0.1 10*3/uL (ref 0.0–0.2)
EOS (ABSOLUTE): 0.1 10*3/uL (ref 0.0–0.4)
EOS: 3 %
HEMATOCRIT: 42.1 % (ref 34.0–46.6)
Hemoglobin: 13.9 g/dL (ref 11.1–15.9)
IMMATURE GRANULOCYTES: 0 %
Immature Grans (Abs): 0 10*3/uL (ref 0.0–0.1)
LYMPHS ABS: 1.5 10*3/uL (ref 0.7–3.1)
Lymphs: 29 %
MCH: 29.3 pg (ref 26.6–33.0)
MCHC: 33 g/dL (ref 31.5–35.7)
MCV: 89 fL (ref 79–97)
MONOS ABS: 0.4 10*3/uL (ref 0.1–0.9)
Monocytes: 7 %
NEUTROS ABS: 3.2 10*3/uL (ref 1.4–7.0)
Neutrophils: 60 %
Platelets: 236 10*3/uL (ref 150–379)
RBC: 4.74 x10E6/uL (ref 3.77–5.28)
RDW: 13.7 % (ref 12.3–15.4)
WBC: 5.3 10*3/uL (ref 3.4–10.8)

## 2018-01-13 LAB — LIPID PANEL
CHOL/HDL RATIO: 3.4 ratio (ref 0.0–4.4)
Cholesterol, Total: 160 mg/dL (ref 100–199)
HDL: 47 mg/dL (ref >39)
LDL CALC: 96 mg/dL (ref 0–99)
Triglycerides: 83 mg/dL (ref 0–149)
VLDL Cholesterol Cal: 17 mg/dL (ref 5–40)

## 2018-01-13 LAB — TSH: TSH: 0.845 u[IU]/mL (ref 0.450–4.500)

## 2018-01-13 LAB — COMPREHENSIVE METABOLIC PANEL
A/G RATIO: 1.8 (ref 1.2–2.2)
ALT: 40 IU/L — ABNORMAL HIGH (ref 0–32)
AST: 26 IU/L (ref 0–40)
Albumin: 4 g/dL (ref 3.5–4.8)
Alkaline Phosphatase: 68 IU/L (ref 39–117)
BILIRUBIN TOTAL: 0.5 mg/dL (ref 0.0–1.2)
BUN/Creatinine Ratio: 21 (ref 12–28)
BUN: 16 mg/dL (ref 8–27)
CALCIUM: 9.2 mg/dL (ref 8.7–10.3)
CO2: 25 mmol/L (ref 20–29)
Chloride: 103 mmol/L (ref 96–106)
Creatinine, Ser: 0.77 mg/dL (ref 0.57–1.00)
GFR calc Af Amer: 90 mL/min/{1.73_m2} (ref >59)
GFR, EST NON AFRICAN AMERICAN: 78 mL/min/{1.73_m2} (ref >59)
GLOBULIN, TOTAL: 2.2 g/dL (ref 1.5–4.5)
Glucose: 93 mg/dL (ref 65–99)
POTASSIUM: 4.4 mmol/L (ref 3.5–5.2)
SODIUM: 141 mmol/L (ref 134–144)
Total Protein: 6.2 g/dL (ref 6.0–8.5)

## 2018-01-13 NOTE — Telephone Encounter (Signed)
-   As below

## 2018-01-13 NOTE — Telephone Encounter (Signed)
Advised patient of results.  

## 2018-01-13 NOTE — Telephone Encounter (Signed)
Per Peggyann Shoals at Dr [facility] office pt is not due for colonoscopy until May 2021 unless she is having GI issues

## 2018-02-05 ENCOUNTER — Telehealth: Payer: Self-pay

## 2018-02-05 ENCOUNTER — Ambulatory Visit
Admission: RE | Admit: 2018-02-05 | Discharge: 2018-02-05 | Disposition: A | Discharge status: Home / Self Care | Payer: PPO | Source: Ambulatory Visit | Attending: Physician Assistant | Admitting: Physician Assistant

## 2018-02-05 DIAGNOSIS — Z1231 Encounter for screening mammogram for malignant neoplasm of breast: Secondary | ICD-10-CM | POA: Insufficient documentation

## 2018-02-05 NOTE — Telephone Encounter (Signed)
Patient advised as directed below.  Thanks,  -Joseline 

## 2018-02-05 NOTE — Telephone Encounter (Signed)
-----   Message from Mar Daring, PA-C sent at 02/05/2018  4:00 PM EDT ----- Normal mammogram. Repeat screening in one year.

## 2018-02-16 ENCOUNTER — Encounter: Payer: Self-pay | Admitting: *Deleted

## 2018-02-17 ENCOUNTER — Encounter
Admission: RE | Disposition: A | Discharge status: Home / Self Care | Payer: Self-pay | Source: Ambulatory Visit | Attending: Unknown Physician Specialty

## 2018-02-17 ENCOUNTER — Ambulatory Visit
Admission: RE | Admit: 2018-02-17 | Discharge: 2018-02-17 | Disposition: A | Discharge status: Home / Self Care | Payer: PPO | Source: Ambulatory Visit | Attending: Unknown Physician Specialty | Admitting: Unknown Physician Specialty

## 2018-02-17 ENCOUNTER — Ambulatory Visit: Payer: PPO | Admitting: Anesthesiology

## 2018-02-17 ENCOUNTER — Encounter: Payer: Self-pay | Admitting: *Deleted

## 2018-02-17 DIAGNOSIS — Z79899 Other long term (current) drug therapy: Secondary | ICD-10-CM | POA: Diagnosis not present

## 2018-02-17 DIAGNOSIS — Z9011 Acquired absence of right breast and nipple: Secondary | ICD-10-CM | POA: Insufficient documentation

## 2018-02-17 DIAGNOSIS — R1310 Dysphagia, unspecified: Secondary | ICD-10-CM | POA: Insufficient documentation

## 2018-02-17 DIAGNOSIS — K2270 Barrett's esophagus without dysplasia: Secondary | ICD-10-CM | POA: Insufficient documentation

## 2018-02-17 DIAGNOSIS — K2970 Gastritis, unspecified, without bleeding: Secondary | ICD-10-CM | POA: Insufficient documentation

## 2018-02-17 DIAGNOSIS — Z09 Encounter for follow-up examination after completed treatment for conditions other than malignant neoplasm: Secondary | ICD-10-CM | POA: Diagnosis not present

## 2018-02-17 DIAGNOSIS — E7800 Pure hypercholesterolemia, unspecified: Secondary | ICD-10-CM | POA: Insufficient documentation

## 2018-02-17 DIAGNOSIS — Z853 Personal history of malignant neoplasm of breast: Secondary | ICD-10-CM | POA: Insufficient documentation

## 2018-02-17 DIAGNOSIS — K2960 Other gastritis without bleeding: Secondary | ICD-10-CM | POA: Diagnosis not present

## 2018-02-17 HISTORY — DX: Hypothyroidism, unspecified: E03.9

## 2018-02-17 HISTORY — DX: Barrett's esophagus without dysplasia: K22.70

## 2018-02-17 HISTORY — PX: ESOPHAGOGASTRODUODENOSCOPY (EGD) WITH PROPOFOL: SHX5813

## 2018-02-17 SURGERY — ESOPHAGOGASTRODUODENOSCOPY (EGD) WITH PROPOFOL
Anesthesia: General

## 2018-02-17 MED ORDER — PROPOFOL 500 MG/50ML IV EMUL
INTRAVENOUS | Status: AC
  Filled 2018-02-17: qty 50

## 2018-02-17 MED ORDER — SODIUM CHLORIDE 0.9 % IV SOLN
INTRAVENOUS | Status: DC
  Administered 2018-02-17: 07:00:00 via INTRAVENOUS

## 2018-02-17 MED ORDER — PROPOFOL 10 MG/ML IV BOLUS
INTRAVENOUS | Status: DC | PRN
  Administered 2018-02-17: 50 mg via INTRAVENOUS

## 2018-02-17 MED ORDER — PROPOFOL 500 MG/50ML IV EMUL
INTRAVENOUS | Status: DC | PRN
  Administered 2018-02-17: 125 ug/kg/min via INTRAVENOUS

## 2018-02-17 MED ORDER — LACTATED RINGERS IV SOLN
INTRAVENOUS | Status: DC | PRN
  Administered 2018-02-17: 08:00:00 via INTRAVENOUS

## 2018-02-17 MED ORDER — SODIUM CHLORIDE 0.9 % IV SOLN: INTRAVENOUS | Status: DC

## 2018-02-17 NOTE — Anesthesia Preprocedure Evaluation (Signed)
Anesthesia Evaluation  Patient identified by MRN, date of birth, ID band Patient awake    Reviewed: Allergy & Precautions, H&P , NPO status , Patient's Chart, lab work & pertinent test results, reviewed documented beta blocker date and time   History of Anesthesia Complications Negative for: history of anesthetic complications  Airway Mallampati: III  TM Distance: >3 FB Neck ROM: full    Dental  (+) Teeth Intact, Dental Advidsory Given   Pulmonary neg pulmonary ROS,           Cardiovascular Exercise Tolerance: Good negative cardio ROS       Neuro/Psych PSYCHIATRIC DISORDERS Anxiety Depression negative neurological ROS     GI/Hepatic Neg liver ROS, hiatal hernia, GERD  ,  Endo/Other  negative endocrine ROS  Renal/GU negative Renal ROS  negative genitourinary   Musculoskeletal   Abdominal   Peds  Hematology negative hematology ROS (+)   Anesthesia Other Findings Past Medical History: No date: Anxiety 2016: Barrett esophagus No date: Breast cancer ([place])     Comment:  right breast c Mastectomy  No date: Dysphagia No date: High cholesterol No date: Hypothyroidism   Reproductive/Obstetrics negative OB ROS                             Anesthesia Physical Anesthesia Plan  ASA: II  Anesthesia Plan: General   Post-op Pain Management:    Induction: Intravenous  PONV Risk Score and Plan: 3 and Propofol infusion  Airway Management Planned: Nasal Cannula  Additional Equipment:   Intra-op Plan:   Post-operative Plan:   Informed Consent: I have reviewed the patients History and Physical, chart, labs and discussed the procedure including the risks, benefits and alternatives for the proposed anesthesia with the patient or authorized representative who has indicated his/her understanding and acceptance.   Dental Advisory Given  Plan Discussed with: Anesthesiologist, CRNA and  Surgeon  Anesthesia Plan Comments:         Anesthesia Quick Evaluation

## 2018-02-17 NOTE — Transfer of Care (Signed)
Immediate Anesthesia Transfer of Care Note  Patient: Kristen Hays  Procedure(s) Performed: ESOPHAGOGASTRODUODENOSCOPY (EGD) WITH PROPOFOL (N/A )  Patient Location: PACU and Endoscopy Unit  Anesthesia Type:General  Level of Consciousness: awake, alert  and oriented  Airway & Oxygen Therapy: Patient Spontanous Breathing and Patient connected to nasal cannula oxygen  Post-op Assessment: Report given to RN and Post -op Vital signs reviewed and stable  Post vital signs: Reviewed and stable  Last Vitals:  Vitals Value Taken Time  BP 131/70 02/17/2018  7:54 AM  Temp    Pulse    Resp    SpO2    Vitals shown include unvalidated device data.  Last Pain:  Vitals:   02/17/18 0706  TempSrc: Tympanic         Complications: No apparent anesthesia complications

## 2018-02-17 NOTE — Op Note (Signed)
[facility] Gastroenterology Patient Name: Kristen Hays Procedure Date: 02/17/2018 7:44 AM MRN: [id number] Account #: [id number] Date of Birth: [birth year] Admit Type: Outpatient Age: 72 Room: [facility] ENDO ROOM 3 Gender: Female Note Status: Finalized Procedure:            Upper GI endoscopy Indications:          Follow-up of Barrett's esophagus Providers:            Manya Silvas, MD Referring MD:         Mar Daring (Referring MD) Medicines:            Propofol per Anesthesia Complications:        No immediate complications. Procedure:            Pre-Anesthesia Assessment:                       - After reviewing the risks and benefits, the patient                        was deemed in satisfactory condition to undergo the                        procedure.                       After obtaining informed consent, the endoscope was                        passed under direct vision. Throughout the procedure,                        the patient's blood pressure, pulse, and oxygen                        saturations were monitored continuously. The Endoscope                        was introduced through the mouth, and advanced to the                        second part of duodenum. The upper GI endoscopy was                        accomplished without difficulty. The patient tolerated                        the procedure well. Findings:      There were esophageal mucosal changes secondary to established       short-segment Barrett's disease present in the distal esophagus. The       maximum longitudinal extent of these mucosal changes was 3 cm in length.      Patchy mild inflammation characterized by erythema and granularity was       found in the gastric antrum. Biopsies were taken with a cold forceps for       histology.      The examined duodenum was normal. Impression:           - Esophageal mucosal changes secondary to established   short-segment Barrett's disease.                       -  Gastritis. Biopsied.                       - Normal examined duodenum. Recommendation:       - Await pathology results. Manya Silvas, MD 02/17/2018 7:55:18 AM This report has been signed electronically. Number of Addenda: 0 Note Initiated On: 02/17/2018 7:44 AM      [facility]

## 2018-02-17 NOTE — Anesthesia Post-op Follow-up Note (Signed)
Anesthesia QCDR form completed.        

## 2018-02-17 NOTE — Anesthesia Postprocedure Evaluation (Signed)
Anesthesia Post Note  Patient: Kristen Hays  Procedure(s) Performed: ESOPHAGOGASTRODUODENOSCOPY (EGD) WITH PROPOFOL (N/A )  Patient location during evaluation: Endoscopy Anesthesia Type: General Level of consciousness: awake and alert Pain management: pain level controlled Vital Signs Assessment: post-procedure vital signs reviewed and stable Respiratory status: spontaneous breathing, nonlabored ventilation, respiratory function stable and patient connected to nasal cannula oxygen Cardiovascular status: blood pressure returned to baseline and stable Postop Assessment: no apparent nausea or vomiting Anesthetic complications: no     Last Vitals:  Vitals:   02/17/18 0706 02/17/18 0754  BP: 132/80 131/70  Pulse: 68   Resp: 18 16  Temp: (!) 35.8 C (!) 36.1 C  SpO2:  99%    Last Pain:  Vitals:   02/17/18 0754  TempSrc: Tympanic  PainSc: 0-No pain                 Martha Clan

## 2018-02-17 NOTE — H&P (Signed)
Primary Care Physician:  Mar Daring, PA-C Primary Gastroenterologist:  Dr. Vira Agar  Pre-Procedure History & Physical: HPI:  Kristen Hays is a 72 y.o. female is here for an endoscopy.  Done for Barretts esophagus and dysphagia.   Past Medical History:  Diagnosis Date  . Anxiety   . Barrett esophagus 2016  . Breast cancer ([place])    right breast c Mastectomy   . Dysphagia   . High cholesterol   . Hypothyroidism     Past Surgical History:  Procedure Laterality Date  . AUGMENTATION MAMMAPLASTY Bilateral   . BREAST SURGERY     mastectomy-right; breast augmentation  . CATARACT EXTRACTION W/PHACO Left 07/21/2016   Procedure: CATARACT EXTRACTION PHACO AND INTRAOCULAR LENS PLACEMENT (IOC);  Surgeon: Ronnell Freshwater, MD;  Location: [facility];  Service: Ophthalmology;  Laterality: Left;  LEFT  . CESAREAN SECTION     1974/1979  . COLONOSCOPY N/A 01/17/2015   Procedure: COLONOSCOPY;  Surgeon: Manya Silvas, MD;  Location: [facility] ENDOSCOPY;  Service: Endoscopy;  Laterality: N/A;  . ESOPHAGOGASTRODUODENOSCOPY N/A 01/17/2015   Procedure: ESOPHAGOGASTRODUODENOSCOPY (EGD);  Surgeon: Manya Silvas, MD;  Location: [facility] ENDOSCOPY;  Service: Endoscopy;  Laterality: N/A;  . EYE SURGERY Right 08/2014  . MASTECTOMY Right 2011  . SAVORY DILATION N/A 01/17/2015   Procedure: SAVORY DILATION;  Surgeon: Manya Silvas, MD;  Location: [facility] ENDOSCOPY;  Service: Endoscopy;  Laterality: N/A;  . TONSILLECTOMY  2011    Prior to Admission medications   Medication Sig Start Date End Date Taking? Authorizing Provider  atorvastatin (LIPITOR) 10 MG tablet Take 1 tablet (10 mg total) by mouth at bedtime. 11/06/17  Yes Mar Daring, PA-C  omeprazole (PRILOSEC) 20 MG capsule Take 20 mg by mouth daily.   Yes [provider]  Cholecalciferol (VITAMIN D) 2000 UNITS CAPS Take 1 capsule by mouth daily.    [provider]  escitalopram (LEXAPRO) 10 MG tablet Take  1 tablet (10 mg total) by mouth at bedtime. Start with 1/2 tab PO q hs x 1 week, then increase to 1 tab PO qhs Patient not taking: Reported on 02/17/2018 11/16/17   Mar Daring, PA-C    Allergies as of 02/16/2018  . (No Known Allergies)    Family History  Problem Relation Age of Onset  . Diabetes Mother   . Stroke Mother   . Alzheimer's disease Mother   . Bone cancer Father   . Vascular Disease Father   . Cancer Sister        breast  . Breast cancer Sister   . Irritable bowel syndrome Sister   . Diabetes Sister   . Healthy Brother   . Healthy Sister   . Breast cancer Paternal Aunt   . Breast cancer Cousin     Social History   Socioeconomic History  . Marital status: Divorced    Spouse name: Not on file  . Number of children: 2  . Years of education: HS  . Highest education level: Not on file  Occupational History    Employer: [employer]  Social Needs  . Financial resource strain: Not on file  . Food insecurity:    Worry: Not on file    Inability: Not on file  . Transportation needs:    Medical: Not on file    Non-medical: Not on file  Tobacco Use  . Smoking status: Never Smoker  . Smokeless tobacco: Never Used  Substance and Sexual Activity  .  Alcohol use: No  . Drug use: No  . Sexual activity: Yes    Birth control/protection: Post-menopausal  Lifestyle  . Physical activity:    Days per week: Not on file    Minutes per session: Not on file  . Stress: Not on file  Relationships  . Social connections:    Talks on phone: Not on file    Gets together: Not on file    Attends religious service: Not on file    Active member of club or organization: Not on file    Attends meetings of clubs or organizations: Not on file    Relationship status: Not on file  . Intimate partner violence:    Fear of current or ex partner: Not on file    Emotionally abused: Not on file    Physically abused: Not on file    Forced sexual activity: Not on  file  Other Topics Concern  . Not on file  Social History Narrative  . Not on file    Review of Systems: See HPI, otherwise negative ROS  Physical Exam: BP 132/80   Pulse 68   Temp (!) 96.5 F (35.8 C) (Tympanic)   Resp 18   Ht 5\' 4"  (1.626 m)   Wt 59.9 kg (132 lb)   BMI 22.66 kg/m  General:   Alert,  pleasant and cooperative in NAD Head:  Normocephalic and atraumatic. Neck:  Supple; no masses or thyromegaly. Lungs:  Clear throughout to auscultation.    Heart:  Regular rate and rhythm. Abdomen:  Soft, nontender and nondistended. Normal bowel sounds, without guarding, and without rebound.   Neurologic:  Alert and  oriented x4;  grossly normal neurologically.  Impression/Plan: Kristen Hays is here for an endoscopy to be performed for Follow up Barretts esophagus  Risks, benefits, limitations, and alternatives regarding  endoscopy have been reviewed with the patient.  Questions have been answered.  All parties agreeable.   Gaylyn Cheers, MD  02/17/2018, 7:34 AM

## 2018-02-18 ENCOUNTER — Encounter: Payer: Self-pay | Admitting: Unknown Physician Specialty

## 2018-02-18 LAB — SURGICAL PATHOLOGY

## 2018-04-08 DIAGNOSIS — H43812 Vitreous degeneration, left eye: Secondary | ICD-10-CM | POA: Diagnosis not present

## 2018-05-25 DIAGNOSIS — H60549 Acute eczematoid otitis externa, unspecified ear: Secondary | ICD-10-CM | POA: Diagnosis not present

## 2018-05-25 DIAGNOSIS — H6122 Impacted cerumen, left ear: Secondary | ICD-10-CM | POA: Diagnosis not present

## 2018-05-25 DIAGNOSIS — R070 Pain in throat: Secondary | ICD-10-CM | POA: Diagnosis not present

## 2018-06-07 ENCOUNTER — Ambulatory Visit
Admission: RE | Admit: 2018-06-07 | Discharge: 2018-06-07 | Disposition: A | Discharge status: Home / Self Care | Payer: PPO | Source: Ambulatory Visit | Attending: Family Medicine | Admitting: Family Medicine

## 2018-06-07 ENCOUNTER — Telehealth: Payer: Self-pay

## 2018-06-07 ENCOUNTER — Encounter: Payer: Self-pay | Admitting: Family Medicine

## 2018-06-07 ENCOUNTER — Ambulatory Visit (INDEPENDENT_AMBULATORY_CARE_PROVIDER_SITE_OTHER): Payer: PPO | Admitting: Family Medicine

## 2018-06-07 VITALS — BP 110/70 | HR 64 | Temp 98.7°F | Resp 16 | Ht 64.0 in | Wt 138.0 lb

## 2018-06-07 DIAGNOSIS — S8990XA Unspecified injury of unspecified lower leg, initial encounter: Secondary | ICD-10-CM

## 2018-06-07 DIAGNOSIS — M7989 Other specified soft tissue disorders: Secondary | ICD-10-CM | POA: Diagnosis not present

## 2018-06-07 DIAGNOSIS — M25562 Pain in left knee: Secondary | ICD-10-CM | POA: Insufficient documentation

## 2018-06-07 DIAGNOSIS — W19XXXA Unspecified fall, initial encounter: Secondary | ICD-10-CM | POA: Insufficient documentation

## 2018-06-07 DIAGNOSIS — S8992XA Unspecified injury of left lower leg, initial encounter: Secondary | ICD-10-CM | POA: Diagnosis not present

## 2018-06-07 DIAGNOSIS — Z23 Encounter for immunization: Secondary | ICD-10-CM

## 2018-06-07 NOTE — Progress Notes (Signed)
  Subjective:     Patient ID: Kristen Hays, female   DOB: Jul 05, 1946, 72 y.o.   MRN: [id number] Chief Complaint  Patient presents with  . Knee Pain    Patient reports falling on Thursday outside her house. Patient reports knee is still swollen and reports pain.   HPI States she tripped on the sidewalk and fell on her bilateral knees 10/3. She sustained abrasions on her knee and right forearm. Left knee remains swollen and bruised. Has not used cold compresses or nsaid's. Review of Systems     Objective:   Physical Exam  Constitutional: She appears well-developed and well-nourished. No distress (no antalgic gait).  Musculoskeletal:  Knee ligaments stable. Mild swelling and ecchymosis to left knee with dependent drift. Bilateral KF/KE 5/5. Tender over superior aspect of left patella.  Skin:  Abrasions over bilateral patellas and right forearm.       Assessment:    1. Knee injury, unspecified laterality, initial encounter - DG Knee Complete 4 Views Left; Future  2. Need for influenza vaccination - Flu vaccine HIGH DOSE PF    Plan:    Discussed use of nsaid's, cold compresses, and elevation. Continue abx ointment and dressing to abrasions. Further f/u pending x-ray report

## 2018-06-07 NOTE — Telephone Encounter (Signed)
-----   Message from Carmon Ginsberg, [place] sent at 06/07/2018  1:46 PM EDT ----- No fractures-continue treatment that we discussed

## 2018-06-07 NOTE — Telephone Encounter (Signed)
Patient advised.KW 

## 2018-06-07 NOTE — Patient Instructions (Signed)
Discussed cold compresses for 20 minutes several x day and elevation for left knee. Start taking two Aleve twice daily with food. We will call you with the x-ray report. Continue antibiotic ointment and bandaids to the abrasions.

## 2018-06-14 ENCOUNTER — Encounter: Payer: Self-pay | Admitting: Physician Assistant

## 2018-06-14 ENCOUNTER — Ambulatory Visit (INDEPENDENT_AMBULATORY_CARE_PROVIDER_SITE_OTHER): Payer: PPO | Admitting: Physician Assistant

## 2018-06-14 VITALS — BP 110/70 | HR 72 | Temp 98.2°F | Resp 16 | Wt 139.0 lb

## 2018-06-14 DIAGNOSIS — W19XXXS Unspecified fall, sequela: Secondary | ICD-10-CM

## 2018-06-14 DIAGNOSIS — M7052 Other bursitis of knee, left knee: Secondary | ICD-10-CM | POA: Diagnosis not present

## 2018-06-14 MED ORDER — CEPHALEXIN 500 MG PO CAPS: 500.0000 mg | ORAL_CAPSULE | Freq: Four times a day (QID) | ORAL | 0 refills | Status: DC

## 2018-06-14 MED ORDER — METHYLPREDNISOLONE 4 MG PO TBPK: ORAL_TABLET | ORAL | 0 refills | Status: DC

## 2018-06-14 NOTE — Progress Notes (Signed)
Patient: Kristen Hays Female    DOB: May 18, 1946   72 y.o.   MRN: [id number] Visit Date: 06/14/2018  Today's Provider: Mar Daring, PA-C   Chief Complaint  Patient presents with  . Knee Pain   Subjective:    HPI Patient reports that fell about a week and 1/2 ago outside her house. She was just walking around the neighborhood and tripped on the sidewalk and fell on her bilateral knees an elbows. Patient reports she injured her left knee. She was seen last Monday or this and was advised to use nsaid's, cold compresses, and elevation. Continue abx ointment and dressing to abrasions. Xray was negative. Patient reports that her leg and ankle area is swollen and bruised and hurts more. Reports that it hurts when she bends her knee. Reports that her knee area feels hot to touch.    No Known Allergies   Current Outpatient Medications:  .  atorvastatin (LIPITOR) 10 MG tablet, Take 1 tablet (10 mg total) by mouth at bedtime., Disp: 90 tablet, Rfl: 1 .  Cholecalciferol (VITAMIN D) 2000 UNITS CAPS, Take 1 capsule by mouth daily., Disp: , Rfl:  .  escitalopram (LEXAPRO) 10 MG tablet, Take 1 tablet (10 mg total) by mouth at bedtime. Start with 1/2 tab PO q hs x 1 week, then increase to 1 tab PO qhs, Disp: 90 tablet, Rfl: 1 .  omeprazole (PRILOSEC) 20 MG capsule, Take 20 mg by mouth daily., Disp: , Rfl:   Review of Systems  Constitutional: Negative.   Respiratory: Negative.   Cardiovascular: Negative.   Gastrointestinal: Negative.   Musculoskeletal: Positive for arthralgias, gait problem and joint swelling.  Neurological: Negative for dizziness, weakness and numbness.    Social History   Tobacco Use  . Smoking status: Never Smoker  . Smokeless tobacco: Never Used  Substance Use Topics  . Alcohol use: No   Objective:   BP 110/70 (BP Location: Left Arm, Patient Position: Sitting, Cuff Size: Normal)   Pulse 72   Temp 98.2 F (36.8 C) (Oral)   Resp 16   Wt 139 lb (63  kg)   SpO2 98%   BMI 23.86 kg/m  Vitals:   06/14/18 1406  BP: 110/70  Pulse: 72  Resp: 16  Temp: 98.2 F (36.8 C)  TempSrc: Oral  SpO2: 98%  Weight: 139 lb (63 kg)     Physical Exam  Constitutional: She appears well-developed and well-nourished. No distress.  Neck: Normal range of motion. Neck supple.  Cardiovascular: Normal rate, regular rhythm and normal heart sounds. Exam reveals no gallop and no friction rub.  No murmur heard. Pulmonary/Chest: Effort normal and breath sounds normal. No respiratory distress. She has no wheezes. She has no rales.  Musculoskeletal:       Left knee: She exhibits decreased range of motion, swelling and erythema. She exhibits no LCL laxity, normal patellar mobility, no bony tenderness and no MCL laxity. Tenderness found. Patellar tendon tenderness noted.       Left ankle: She exhibits decreased range of motion, swelling and ecchymosis. No tenderness. Achilles tendon normal.  Skin: She is not diaphoretic.  Vitals reviewed.       Assessment & Plan:     1. Infrapatellar bursitis of left knee Worsening swelling, redness and warmth of the left knee. Suspect bursitis secondary to crush injury from fall. Will give medrol and keflex as below. Left lower extremity was wrapped from ankle to knee for swelling today.  Advised to remove at night. May replace during the day when she is up. Needs to elevate when she is at rest.  She is to call if symptoms worsen.  - methylPREDNISolone (MEDROL) 4 MG TBPK tablet; 6 day taper; take as directed on package instructions  Dispense: 21 tablet; Refill: 0 - cephALEXin (KEFLEX) 500 MG capsule; Take 1 capsule (500 mg total) by mouth 4 (four) times daily.  Dispense: 14 capsule; Refill: 0  2. Fall, sequela See above medical treatment plan. - methylPREDNISolone (MEDROL) 4 MG TBPK tablet; 6 day taper; take as directed on package instructions  Dispense: 21 tablet; Refill: 0 - cephALEXin (KEFLEX) 500 MG capsule; Take 1 capsule  (500 mg total) by mouth 4 (four) times daily.  Dispense: 14 capsule; Refill: 0       Mar Daring, PA-C  [facility]

## 2018-09-08 ENCOUNTER — Other Ambulatory Visit: Payer: Self-pay | Admitting: Physician Assistant

## 2018-09-08 DIAGNOSIS — F321 Major depressive disorder, single episode, moderate: Secondary | ICD-10-CM

## 2018-10-01 ENCOUNTER — Encounter: Payer: Self-pay | Admitting: Physician Assistant

## 2018-10-01 ENCOUNTER — Ambulatory Visit (INDEPENDENT_AMBULATORY_CARE_PROVIDER_SITE_OTHER): Payer: PPO | Admitting: Physician Assistant

## 2018-10-01 VITALS — BP 107/71 | HR 64 | Temp 97.7°F | Resp 16 | Wt 137.0 lb

## 2018-10-01 DIAGNOSIS — L304 Erythema intertrigo: Secondary | ICD-10-CM

## 2018-10-01 MED ORDER — NYSTATIN 100000 UNIT/GM EX CREA: 1.0000 "application " | TOPICAL_CREAM | Freq: Two times a day (BID) | CUTANEOUS | 0 refills | Status: DC

## 2018-10-01 NOTE — Progress Notes (Signed)
Patient: Kristen Hays Female    DOB: 09/19/1945   73 y.o.   MRN: [id number] Visit Date: 10/01/2018  Today's Provider: Mar Daring, PA-C   Chief Complaint  Patient presents with  . Rash   Subjective:     Rash  This is a new problem. The current episode started in the past 7 days (3-4 days). The affected locations include the chest. The rash is characterized by redness and itchiness. She was exposed to nothing. Pertinent negatives include no anorexia, congestion, cough, diarrhea, eye pain, facial edema, fatigue, fever, joint pain, nail changes, rhinorrhea, shortness of breath, sore throat or vomiting. Treatments tried: hand lotion. The treatment provided no relief.    Patient has had a red rash under her right breast for 3-4 days. Patient states rash is itchy. Patient has been putting hand lotion on rash.   No Known Allergies   Current Outpatient Medications:  .  atorvastatin (LIPITOR) 10 MG tablet, Take 1 tablet (10 mg total) by mouth at bedtime., Disp: 90 tablet, Rfl: 1 .  escitalopram (LEXAPRO) 10 MG tablet, START WITH 1/2 TAB BY MOUTH AT BEDTIME X 1 WEEK, THEN INCREASE TO 1 TAB AT BEDTIME, Disp: 90 tablet, Rfl: 1 .  methylPREDNISolone (MEDROL) 4 MG TBPK tablet, 6 day taper; take as directed on package instructions, Disp: 21 tablet, Rfl: 0 .  omeprazole (PRILOSEC) 20 MG capsule, Take 20 mg by mouth daily., Disp: , Rfl:  .  cephALEXin (KEFLEX) 500 MG capsule, Take 1 capsule (500 mg total) by mouth 4 (four) times daily. (Patient not taking: Reported on 10/01/2018), Disp: 14 capsule, Rfl: 0 .  Cholecalciferol (VITAMIN D) 2000 UNITS CAPS, Take 1 capsule by mouth daily., Disp: , Rfl:   Review of Systems  Constitutional: Negative for appetite change, chills, fatigue and fever.  HENT: Negative for congestion, rhinorrhea and sore throat.   Eyes: Negative for pain.  Respiratory: Negative for cough, chest tightness and shortness of breath.   Cardiovascular: Negative for  chest pain and palpitations.  Gastrointestinal: Negative for abdominal pain, anorexia, diarrhea, nausea and vomiting.  Musculoskeletal: Negative for joint pain.  Skin: Positive for rash. Negative for nail changes.  Neurological: Negative for dizziness and weakness.    Social History   Tobacco Use  . Smoking status: Never Smoker  . Smokeless tobacco: Never Used  Substance Use Topics  . Alcohol use: No      Objective:   BP 107/71 (BP Location: Left Arm, Patient Position: Sitting, Cuff Size: Large)   Pulse 64   Temp 97.7 F (36.5 C) (Oral)   Resp 16   Wt 137 lb (62.1 kg)   SpO2 97%   BMI 23.52 kg/m  Vitals:   10/01/18 1743  BP: 107/71  Pulse: 64  Resp: 16  Temp: 97.7 F (36.5 C)  TempSrc: Oral  SpO2: 97%  Weight: 137 lb (62.1 kg)     Physical Exam Vitals signs reviewed.  Constitutional:      General: She is not in acute distress.    Appearance: She is well-developed and normal weight. She is not diaphoretic.  Neck:     Musculoskeletal: Normal range of motion and neck supple.     Thyroid: No thyromegaly.     Vascular: No JVD.     Trachea: No tracheal deviation.  Cardiovascular:     Rate and Rhythm: Normal rate and regular rhythm.     Heart sounds: Normal heart sounds. No murmur. No friction  rub. No gallop.   Pulmonary:     Effort: Pulmonary effort is normal. No respiratory distress.     Breath sounds: Normal breath sounds. No wheezing or rales.  Lymphadenopathy:     Cervical: No cervical adenopathy.  Skin:    Findings: Erythema and rash present. Rash is papular.       Neurological:     Mental Status: She is alert.         Assessment & Plan    1. Intertrigo Erythematous, papular, pruritic rash with well demarcated irregular borders located under the right breast c/w intertrigo. Will start Nystatin cream as below. Keep area dry and clean. Advised to call if no improvements.  - nystatin cream (MYCOSTATIN); Apply 1 application topically 2 (two) times  daily.  Dispense: 30 g; Refill: 0     Mar Daring, PA-C  [facility]

## 2018-10-01 NOTE — Patient Instructions (Signed)
Intertrigo  Intertrigo is skin irritation or inflammation (dermatitis) that occurs when folds of skin rub together. The irritation can cause a rash and make skin raw and itchy. This condition most commonly occurs in the skin folds of these areas:  · Toes.  · Armpits.  · Groin.  · Under the belly.  · Under the breasts.  · Buttocks.  Intertrigo is not passed from person to person (is not contagious).  What are the causes?  This condition is caused by heat, moisture, rubbing (friction), and not enough air circulation. The condition can be made worse by:  · Sweat.  · Bacteria.  · A fungus, such as yeast.  What increases the risk?  This condition is more likely to occur if you have moisture in your skin folds. You are more likely to develop this condition if you:  · Have diabetes.  · Are overweight.  · Are not able to move around or are not active.  · Live in a warm and moist climate.  · Wear splints, braces, or other medical devices.  · Are not able to control your bowels or bladder (have incontinence).  What are the signs or symptoms?  Symptoms of this condition include:  · A pink or red skin rash in the skin fold or near the skin fold.  · Raw or scaly skin.  · Itchiness.  · A burning feeling.  · Bleeding.  · Leaking fluid.  · A bad smell.  How is this diagnosed?  This condition is diagnosed with a medical history and physical exam. You may also have a skin swab to test for bacteria or a fungus.  How is this treated?  This condition may be treated by:  · Cleaning and drying your skin.  · Taking an antibiotic medicine or using an antibiotic skin cream for a bacterial infection.  · Using an antifungal cream on your skin or taking pills for an infection that was caused by a fungus, such as yeast.  · Using a steroid ointment to relieve itchiness and irritation.  · Separating the skin fold with a clean cotton cloth to absorb moisture and allow air to flow into the area.  Follow these instructions at home:  · Keep the  affected area clean and dry.  · Do not scratch your skin.  · Stay in a cool environment as much as possible. Use an air conditioner or fan, if available.  · Apply over-the-counter and prescription medicines only as told by your health care provider.  · If you were prescribed an antibiotic medicine, use it as told by your health care provider. Do not stop using the antibiotic even if your condition improves.  · Keep all follow-up visits as told by your health care provider. This is important.  How is this prevented?    · Maintain a healthy weight.  · Take care of your feet, especially if you have diabetes. Foot care includes:  ? Wearing shoes that fit well.  ? Keeping your feet dry.  ? Wearing clean, breathable socks.  · Protect the skin around your groin and buttocks, especially if you have incontinence. Skin protection includes:  ? Following a regular cleaning routine.  ? Using skin protectant creams, powders, or ointments.  ? Changing protection pads frequently.  · Do not wear tight clothes. Wear clothes that are loose, absorbent, and made of cotton.  · Wear a bra that gives good support, if needed.  · Shower and dry yourself well   after activity or exercise. Use a hair dryer on a cool setting to dry between skin folds, especially after you bathe.  · If you have diabetes, keep your blood sugar under control.  Contact a health care provider if:  · Your symptoms do not improve with treatment.  · Your symptoms get worse or they spread.  · You notice increased redness and warmth.  · You have a fever.  Summary  · Intertrigo is skin irritation or inflammation (dermatitis) that occurs when folds of skin rub together.  · This condition is caused by heat, moisture, rubbing (friction), and not enough air circulation.  · This condition may be treated by cleaning and drying your skin and with medicines.  · Apply over-the-counter and prescription medicines only as told by your health care provider.  · Keep all follow-up visits  as told by your health care provider. This is important.  This information is not intended to replace advice given to you by your health care provider. Make sure you discuss any questions you have with your health care provider.  Document Released: 08/18/2005 Document Revised: 01/18/2018 Document Reviewed: 01/18/2018  Elsevier Interactive Patient Education © 2019 [employer]

## 2018-10-04 ENCOUNTER — Ambulatory Visit: Payer: PPO | Admitting: Physician Assistant

## 2018-11-10 ENCOUNTER — Ambulatory Visit (INDEPENDENT_AMBULATORY_CARE_PROVIDER_SITE_OTHER): Payer: PPO | Admitting: Physician Assistant

## 2018-11-10 ENCOUNTER — Ambulatory Visit
Admission: RE | Admit: 2018-11-10 | Discharge: 2018-11-10 | Disposition: A | Discharge status: Home / Self Care | Payer: PPO | Attending: Physician Assistant | Admitting: Physician Assistant

## 2018-11-10 ENCOUNTER — Encounter: Payer: Self-pay | Admitting: Physician Assistant

## 2018-11-10 ENCOUNTER — Other Ambulatory Visit: Payer: Self-pay

## 2018-11-10 ENCOUNTER — Telehealth: Payer: Self-pay

## 2018-11-10 ENCOUNTER — Ambulatory Visit
Admission: RE | Admit: 2018-11-10 | Discharge: 2018-11-10 | Disposition: A | Discharge status: Home / Self Care | Payer: PPO | Source: Ambulatory Visit | Attending: Physician Assistant | Admitting: Physician Assistant

## 2018-11-10 VITALS — BP 118/80 | HR 76 | Temp 97.9°F | Ht 64.0 in | Wt 137.6 lb

## 2018-11-10 DIAGNOSIS — R5383 Other fatigue: Secondary | ICD-10-CM

## 2018-11-10 DIAGNOSIS — M25531 Pain in right wrist: Secondary | ICD-10-CM | POA: Insufficient documentation

## 2018-11-10 DIAGNOSIS — E559 Vitamin D deficiency, unspecified: Secondary | ICD-10-CM | POA: Diagnosis not present

## 2018-11-10 DIAGNOSIS — K2270 Barrett's esophagus without dysplasia: Secondary | ICD-10-CM

## 2018-11-10 DIAGNOSIS — K5901 Slow transit constipation: Secondary | ICD-10-CM

## 2018-11-10 DIAGNOSIS — K2100 Gastro-esophageal reflux disease with esophagitis, without bleeding: Secondary | ICD-10-CM

## 2018-11-10 MED ORDER — OMEPRAZOLE 40 MG PO CPDR: 40.0000 mg | DELAYED_RELEASE_CAPSULE | Freq: Two times a day (BID) | ORAL | 0 refills | Status: DC

## 2018-11-10 NOTE — Telephone Encounter (Signed)
-----   Message from Mar Daring, [place] sent at 11/10/2018  4:27 PM EDT ----- Mild arthritic changes noted in the right 1st MCP joint (joint where base of thumb and wrist meet). Recommend tylenol for now until we figure out stomach.

## 2018-11-10 NOTE — Telephone Encounter (Signed)
lmtcb

## 2018-11-10 NOTE — Patient Instructions (Signed)
Miralax for constipation. Start with 1 capful daily then adjust up or down depending on frequency and stool consistency.  Increase omeprazole to 40mg  BID x 2 weeks. Call if no improvements. We will stop the medication x 2 weeks and get the H. Pylori breath test.  Will follow up pending Xray of right wrist with plan.

## 2018-11-10 NOTE — Progress Notes (Signed)
Patient: Kristen Hays Female    DOB: [birth year]   73 y.o.   MRN: [id number] Visit Date: 11/10/2018  Today's Provider: Mar Daring, PA-C   Chief Complaint  Patient presents with  . Fatigue    since last month  . Constipation  . Gastroesophageal Reflux  . right wrist pain   Subjective:     HPI  Pt reports she is having fatigue, constipation, acid reflux and right wrist pain and states she has not injured it, it just all of a sudden started hurting.  This has been going on for about a month.  Fatigue: ongoing x 1 month. Husband reports she snores some. No witnessed apnea. Wakes up feeling tired. Takes naps occasionally. No issues sleeping.   GERD: worsening symptoms x 1 month. Does take omeprazole 20mg  daily for Barrett's esophagus. Most recent EGD was 2019. Having increased bloating and epigastric pain.   Constipation: Chronic issue. Uses Metamucil daily. Worsening over last couple weeks.   Right wrist pain: Has only happened 3 times over last month. Feels pain in the middle of the wrist on the palmar side that radiates to the forearm. No numbness or tingling in the fingers.   No Known Allergies   Current Outpatient Medications:  .  Cyanocobalamin (VITAMIN B12 PO), Take 2,500 mg by mouth., Disp: , Rfl:  .  omeprazole (PRILOSEC) 20 MG capsule, Take 20 mg by mouth daily., Disp: , Rfl:  .  atorvastatin (LIPITOR) 10 MG tablet, Take 1 tablet (10 mg total) by mouth at bedtime. (Patient not taking: Reported on 11/10/2018), Disp: 90 tablet, Rfl: 1 .  cephALEXin (KEFLEX) 500 MG capsule, Take 1 capsule (500 mg total) by mouth 4 (four) times daily. (Patient not taking: Reported on 10/01/2018), Disp: 14 capsule, Rfl: 0 .  Cholecalciferol (VITAMIN D) 2000 UNITS CAPS, Take 1 capsule by mouth daily., Disp: , Rfl:  .  escitalopram (LEXAPRO) 10 MG tablet, START WITH 1/2 TAB BY MOUTH AT BEDTIME X 1 WEEK, THEN INCREASE TO 1 TAB AT BEDTIME (Patient not taking: Reported on 11/10/2018),  Disp: 90 tablet, Rfl: 1 .  methylPREDNISolone (MEDROL) 4 MG TBPK tablet, 6 day taper; take as directed on package instructions (Patient not taking: Reported on 11/10/2018), Disp: 21 tablet, Rfl: 0 .  nystatin cream (MYCOSTATIN), Apply 1 application topically 2 (two) times daily. (Patient not taking: Reported on 11/10/2018), Disp: 30 g, Rfl: 0  Review of Systems  Constitutional: Positive for fatigue.  HENT: Negative.   Respiratory: Negative.   Cardiovascular: Negative.   Gastrointestinal: Positive for abdominal distention, abdominal pain and constipation.  Endocrine: Negative.   Musculoskeletal: Positive for arthralgias (right wrist pain).  Neurological: Negative.     Social History   Tobacco Use  . Smoking status: Never Smoker  . Smokeless tobacco: Never Used  Substance Use Topics  . Alcohol use: No      Objective:   BP 118/80 (BP Location: Left Arm, Patient Position: Sitting, Cuff Size: Normal)   Pulse 76   Temp 97.9 F (36.6 C) (Oral)   Ht 5\' 4"  (1.626 m)   Wt 137 lb 9.6 oz (62.4 kg)   SpO2 96%   BMI 23.62 kg/m  Vitals:   11/10/18 0949  BP: 118/80  Pulse: 76  Temp: 97.9 F (36.6 C)  TempSrc: Oral  SpO2: 96%  Weight: 137 lb 9.6 oz (62.4 kg)  Height: 5\' 4"  (1.626 m)     Physical Exam Vitals signs reviewed.  Constitutional:      General: She is not in acute distress.    Appearance: Normal appearance. She is well-developed and normal weight. She is not ill-appearing or diaphoretic.  HENT:     Head: Normocephalic and atraumatic.     Mouth/Throat:     Mouth: Mucous membranes are moist.  Neck:     Musculoskeletal: Normal range of motion and neck supple.     Thyroid: No thyromegaly.     Vascular: No JVD.     Trachea: No tracheal deviation.  Cardiovascular:     Rate and Rhythm: Normal rate and regular rhythm.     Heart sounds: Normal heart sounds. No murmur. No friction rub. No gallop.   Pulmonary:     Effort: Pulmonary effort is normal. No respiratory  distress.     Breath sounds: Normal breath sounds. No wheezing or rales.  Abdominal:     General: Abdomen is flat. Bowel sounds are normal. There is no distension.     Palpations: Abdomen is soft.     Tenderness: There is abdominal tenderness in the epigastric area. There is no guarding or rebound. Negative signs include Murphy's sign.     Hernia: No hernia is present.  Lymphadenopathy:     Cervical: No cervical adenopathy.  Neurological:     Mental Status: She is alert.         Assessment & Plan    1. Fatigue, unspecified type DDx: vitamin def, hypothyroid, diabetes, sleep apnea. Will check labs as below and f/u pending results. May consider sleep study if all labs are normal.  - CBC w/Diff/Platelet - Comprehensive Metabolic Panel (CMET) - TSH - Vitamin D (25 hydroxy) - B12 and Folate Panel  2. Gastroesophageal reflux disease with esophagitis Worsening. Will increase omeprazole to 40mg  BID as below x 2 weeks. If not improving she is to stop the omeprazole and we will get an H.pylori breath test in 4 weeks at f/u. She is in agreement.  - omeprazole (PRILOSEC) 40 MG capsule; Take 1 capsule (40 mg total) by mouth 2 (two) times daily.  Dispense: 60 capsule; Refill: 0  3. Barrett's esophagus without dysplasia See above medical treatment plan. - omeprazole (PRILOSEC) 40 MG capsule; Take 1 capsule (40 mg total) by mouth 2 (two) times daily.  Dispense: 60 capsule; Refill: 0  4. Slow transit constipation Worsening. Add miralax. Adjust dose as needed per stool consistency and frequency.   5. Right wrist pain Will get imaging as below. Suspect OA.  - DG Wrist Complete Right; Future     Mar Daring, PA-C  [facility]

## 2018-11-11 ENCOUNTER — Telehealth: Payer: Self-pay | Admitting: Physician Assistant

## 2018-11-11 ENCOUNTER — Telehealth: Payer: Self-pay

## 2018-11-11 LAB — COMPREHENSIVE METABOLIC PANEL
ALT: 15 IU/L (ref 0–32)
AST: 12 IU/L (ref 0–40)
Albumin/Globulin Ratio: 2.1 (ref 1.2–2.2)
Albumin: 4.4 g/dL (ref 3.7–4.7)
Alkaline Phosphatase: 55 IU/L (ref 39–117)
BUN / CREAT RATIO: 23 (ref 12–28)
BUN: 18 mg/dL (ref 8–27)
Bilirubin Total: 0.6 mg/dL (ref 0.0–1.2)
CO2: 24 mmol/L (ref 20–29)
Calcium: 9.6 mg/dL (ref 8.7–10.3)
Chloride: 102 mmol/L (ref 96–106)
Creatinine, Ser: 0.78 mg/dL (ref 0.57–1.00)
GFR calc Af Amer: 88 mL/min/{1.73_m2} (ref >59)
GFR calc non Af Amer: 77 mL/min/{1.73_m2} (ref >59)
Globulin, Total: 2.1 g/dL (ref 1.5–4.5)
Glucose: 87 mg/dL (ref 65–99)
Potassium: 4.8 mmol/L (ref 3.5–5.2)
Sodium: 142 mmol/L (ref 134–144)
Total Protein: 6.5 g/dL (ref 6.0–8.5)

## 2018-11-11 LAB — CBC WITH DIFFERENTIAL/PLATELET
Basophils Absolute: 0.1 10*3/uL (ref 0.0–0.2)
Basos: 1 %
EOS (ABSOLUTE): 0.1 10*3/uL (ref 0.0–0.4)
Eos: 1 %
Hematocrit: 44.8 % (ref 34.0–46.6)
Hemoglobin: 15.2 g/dL (ref 11.1–15.9)
Immature Grans (Abs): 0 10*3/uL (ref 0.0–0.1)
Immature Granulocytes: 0 %
LYMPHS ABS: 1.8 10*3/uL (ref 0.7–3.1)
Lymphs: 28 %
MCH: 29.7 pg (ref 26.6–33.0)
MCHC: 33.9 g/dL (ref 31.5–35.7)
MCV: 88 fL (ref 79–97)
Monocytes Absolute: 0.5 10*3/uL (ref 0.1–0.9)
Monocytes: 8 %
Neutrophils Absolute: 4.1 10*3/uL (ref 1.4–7.0)
Neutrophils: 62 %
PLATELETS: 256 10*3/uL (ref 150–450)
RBC: 5.11 x10E6/uL (ref 3.77–5.28)
RDW: 13.4 % (ref 11.7–15.4)
WBC: 6.5 10*3/uL (ref 3.4–10.8)

## 2018-11-11 LAB — B12 AND FOLATE PANEL
Folate: 9.9 ng/mL (ref >3.0)
Vitamin B-12: 721 pg/mL (ref 232–1245)

## 2018-11-11 LAB — TSH: TSH: 0.799 u[IU]/mL (ref 0.450–4.500)

## 2018-11-11 LAB — VITAMIN D 25 HYDROXY (VIT D DEFICIENCY, FRACTURES): Vit D, 25-Hydroxy: 16.6 ng/mL — ABNORMAL LOW (ref 30.0–100.0)

## 2018-11-11 MED ORDER — VITAMIN D (ERGOCALCIFEROL) 1.25 MG (50000 UNIT) PO CAPS: 50000.0000 [IU] | ORAL_CAPSULE | ORAL | 0 refills | Status: DC

## 2018-11-11 NOTE — Telephone Encounter (Signed)
Patient states that she is calling office to inquire about her labs and xray results.KW

## 2018-11-11 NOTE — Telephone Encounter (Signed)
Please advise results? 

## 2018-11-11 NOTE — Addendum Note (Signed)
Addended by: Mar Daring on: 11/11/2018 01:58 PM   Modules accepted: Orders

## 2018-11-11 NOTE — Telephone Encounter (Signed)
error 

## 2018-11-17 ENCOUNTER — Other Ambulatory Visit: Payer: Self-pay

## 2018-11-17 ENCOUNTER — Ambulatory Visit (INDEPENDENT_AMBULATORY_CARE_PROVIDER_SITE_OTHER): Payer: PPO | Admitting: Physician Assistant

## 2018-11-17 ENCOUNTER — Encounter: Payer: Self-pay | Admitting: Physician Assistant

## 2018-11-17 VITALS — BP 122/80 | HR 76 | Temp 97.7°F | Resp 16 | Wt 139.0 lb

## 2018-11-17 DIAGNOSIS — R1013 Epigastric pain: Secondary | ICD-10-CM | POA: Diagnosis not present

## 2018-11-17 DIAGNOSIS — K5901 Slow transit constipation: Secondary | ICD-10-CM | POA: Diagnosis not present

## 2018-11-17 DIAGNOSIS — K2270 Barrett's esophagus without dysplasia: Secondary | ICD-10-CM | POA: Diagnosis not present

## 2018-11-17 NOTE — Progress Notes (Signed)
Patient: Kristen Hays Female    DOB: 11-19-1945   73 y.o.   MRN: [id number] Visit Date: 11/17/2018  Today's Provider: Mar Daring, PA-C   Chief Complaint  Patient presents with  . Constipation   Subjective:     HPI Patient here today to follow up on constipation, patient reports that she has been taking Miralax one capful daily x's one week. Patient reports mild symptom improvement. Has had 2 BM in last week.   Patient reports she is still having pain around her rib cage under breast in the epigastrum and shortness of breath on and off, especially after eating. Patient denies any blood in stools. Symptoms are worse after eating but present constantly. No weight loss. She does have known Barrett's esophagus. Last EGD was June 2019 by Dr. Vira Agar. Barrett's esophagus was seen without dysplasia. She does also have a known hiatal hernia. No regurgitation of undigested food.   Wt Readings from Last 3 Encounters:  11/17/18 139 lb (63 kg)  11/10/18 137 lb 9.6 oz (62.4 kg)  10/01/18 137 lb (62.1 kg)    No Known Allergies   Current Outpatient Medications:  .  Cholecalciferol (VITAMIN D) 2000 UNITS CAPS, Take 1 capsule by mouth daily., Disp: , Rfl:  .  Cyanocobalamin (VITAMIN B12 PO), Take 2,500 mg by mouth., Disp: , Rfl:  .  omeprazole (PRILOSEC) 40 MG capsule, Take 1 capsule (40 mg total) by mouth 2 (two) times daily., Disp: 60 capsule, Rfl: 0 .  Vitamin D, Ergocalciferol, (DRISDOL) 1.25 MG (50000 UT) CAPS capsule, Take 1 capsule (50,000 Units total) by mouth every 7 (seven) days., Disp: 12 capsule, Rfl: 0  Review of Systems  Constitutional: Positive for fatigue. Negative for activity change, diaphoresis and fever.  HENT: Negative.   Respiratory: Positive for shortness of breath. Negative for cough, chest tightness and wheezing.   Cardiovascular: Negative for chest pain, palpitations and leg swelling.  Gastrointestinal: Positive for abdominal distention, abdominal  pain and constipation. Negative for anal bleeding, blood in stool, diarrhea, nausea, rectal pain and vomiting.  Musculoskeletal: Negative for back pain.    Social History   Tobacco Use  . Smoking status: Never Smoker  . Smokeless tobacco: Never Used  Substance Use Topics  . Alcohol use: No      Objective:   BP 122/80 (BP Location: Left Arm, Patient Position: Sitting, Cuff Size: Large)   Pulse 76   Temp 98.3 F (36.8 C) (Oral)   Resp 16   Wt 139 lb (63 kg)   BMI 23.86 kg/m  Vitals:   11/17/18 1131  BP: 122/80  Pulse: 76  Resp: 16  Temp: 98.3 F (36.8 C)  TempSrc: Oral  Weight: 139 lb (63 kg)     Physical Exam Constitutional:      General: She is not in acute distress.    Appearance: Normal appearance. She is well-developed. She is not ill-appearing or diaphoretic.  HENT:     Head: Normocephalic and atraumatic.  Cardiovascular:     Rate and Rhythm: Normal rate and regular rhythm.     Heart sounds: Normal heart sounds. No murmur. No friction rub. No gallop.   Pulmonary:     Effort: Pulmonary effort is normal. No respiratory distress.     Breath sounds: Normal breath sounds. No wheezing or rales.  Abdominal:     General: Abdomen is flat. Bowel sounds are normal. There is no distension.     Palpations: Abdomen is  soft. There is no mass.     Tenderness: There is abdominal tenderness in the right upper quadrant, epigastric area, left upper quadrant and left lower quadrant. There is no guarding or rebound.     Hernia: No hernia is present.  Skin:    General: Skin is warm and dry.  Neurological:     Mental Status: She is alert and oriented to person, place, and time.        Assessment & Plan    1. Barrett's esophagus without dysplasia Patient has been on increased dose of omeprazole 40mg  BID x 1 week. She reports no change. Since she is on omeprazole I am unable to test for H.pylori unless we do a 2 week wash. She is not ready for to do this at this point. I will  refer back to GI to make sure her Barrett's esophagus has not progressed. Also does have known hiatal hernia as well and this can also be evaluated with Barrett's esophagus. I will also order RUQ [place] as below. If US shows gallbladder involvement we may cancel to GI referral and proceed with gen surg referral.  - Ambulatory referral to Gastroenterology  2. Epigastric pain See above medical treatment plan. - Ambulatory referral to Gastroenterology - US Abdomen Limited RUQ; Future  3. Postprandial epigastric pain See above medical treatment plan. - US Abdomen Limited RUQ; Future  4. Slow transit constipation Increase miralax to 2 capfuls daily.      Mar Daring, PA-C  [facility]

## 2018-11-29 ENCOUNTER — Other Ambulatory Visit: Payer: Self-pay

## 2018-11-29 ENCOUNTER — Telehealth: Payer: Self-pay

## 2018-11-29 ENCOUNTER — Ambulatory Visit
Admission: RE | Admit: 2018-11-29 | Discharge: 2018-11-29 | Disposition: A | Discharge status: Home / Self Care | Payer: PPO | Source: Ambulatory Visit | Attending: Physician Assistant | Admitting: Physician Assistant

## 2018-11-29 DIAGNOSIS — R1011 Right upper quadrant pain: Secondary | ICD-10-CM | POA: Diagnosis not present

## 2018-11-29 DIAGNOSIS — R1013 Epigastric pain: Secondary | ICD-10-CM | POA: Diagnosis not present

## 2018-11-29 NOTE — Telephone Encounter (Signed)
-----   Message from Mar Daring, [place] sent at 11/29/2018  1:19 PM EDT ----- US of the right upper quadrant is normal. Recommend f/u with GI to make sure Barrett's has not progressed as discussed.

## 2018-11-29 NOTE — Telephone Encounter (Signed)
Patient advised as directed below. 

## 2018-11-30 DIAGNOSIS — R140 Abdominal distension (gaseous): Secondary | ICD-10-CM | POA: Diagnosis not present

## 2018-11-30 DIAGNOSIS — R0602 Shortness of breath: Secondary | ICD-10-CM | POA: Diagnosis not present

## 2018-12-03 ENCOUNTER — Other Ambulatory Visit: Payer: Self-pay | Admitting: Physician Assistant

## 2018-12-03 DIAGNOSIS — K2100 Gastro-esophageal reflux disease with esophagitis, without bleeding: Secondary | ICD-10-CM

## 2018-12-03 DIAGNOSIS — K2270 Barrett's esophagus without dysplasia: Secondary | ICD-10-CM

## 2018-12-08 ENCOUNTER — Other Ambulatory Visit: Payer: Self-pay

## 2018-12-08 ENCOUNTER — Ambulatory Visit (INDEPENDENT_AMBULATORY_CARE_PROVIDER_SITE_OTHER): Payer: PPO | Admitting: Physician Assistant

## 2018-12-08 ENCOUNTER — Encounter: Payer: Self-pay | Admitting: Physician Assistant

## 2018-12-08 VITALS — Wt 137.0 lb

## 2018-12-08 DIAGNOSIS — K5904 Chronic idiopathic constipation: Secondary | ICD-10-CM | POA: Diagnosis not present

## 2018-12-08 DIAGNOSIS — R140 Abdominal distension (gaseous): Secondary | ICD-10-CM | POA: Diagnosis not present

## 2018-12-08 DIAGNOSIS — R0781 Pleurodynia: Secondary | ICD-10-CM

## 2018-12-08 NOTE — Progress Notes (Signed)
Virtual Visit via Video Note  I connected with Kristen Hays on 12/08/18 at  9:40 AM EDT by a video enabled telemedicine application and verified that I am speaking with the correct person using two identifiers.   I discussed the limitations of evaluation and management by telemedicine and the availability of in person appointments. The patient expressed understanding and agreed to proceed.  Mar Daring, PA-C   Patient: Kristen Hays Female    DOB: [birth year]   73 y.o.   MRN: [id number] Visit Date: 12/08/2018  Today's Provider: Mar Daring, PA-C   Chief Complaint  Patient presents with  . Follow-up    4 weeks f/u GERD   Subjective:     HPI  Patient doing an e-visit follow-up for her GERD. Patient was seen 11/17/18 patient was refer to GI to make sure her Barrett's esophagus has not progressed and [place] was ordered. Patient's [place] was normal. She did her follow up with GI (Telemedicine). Patient was referred to Pulmonology for the SOB and right rib pain. For her GERD patient is on Omeprazole 40 mg twice daily and reviewed appropriate 30 minutes before meal. She will be reevaluated in 4 weeks in office. Patient was prescribed Linzess for constipation.  She does report that the increased dose of omeprazole has helped her epigastric pain and bloating, but that she still does get this bloating occasionally and is still complaining more of right sided rib pain.  She also reports that linzess is working well for her constipation. She reports she is going every other day with linzess.   No Known Allergies   Current Outpatient Medications:  .  Cyanocobalamin (VITAMIN B12 PO), Take 2,500 mg by mouth., Disp: , Rfl:  .  linaclotide (LINZESS) 72 MCG capsule, Take by mouth., Disp: , Rfl:  .  omeprazole (PRILOSEC) 40 MG capsule, TAKE 1 CAPSULE BY MOUTH TWICE A DAY, Disp: 180 capsule, Rfl: 0 .  Vitamin D, Ergocalciferol, (DRISDOL) 1.25 MG (50000 UT) CAPS capsule, Take 1  capsule (50,000 Units total) by mouth every 7 (seven) days., Disp: 12 capsule, Rfl: 0 .  Cholecalciferol (VITAMIN D) 2000 UNITS CAPS, Take 1 capsule by mouth daily., Disp: , Rfl:   Review of Systems  Constitutional: Negative.   Respiratory: Negative.   Cardiovascular: Negative.   Gastrointestinal: Positive for abdominal distention, abdominal pain and constipation (improving). Negative for anal bleeding, blood in stool, diarrhea, nausea, rectal pain and vomiting.  Genitourinary: Negative.   Musculoskeletal: Negative for back pain.  Neurological: Negative.     Social History   Tobacco Use  . Smoking status: Never Smoker  . Smokeless tobacco: Never Used  Substance Use Topics  . Alcohol use: No      Objective:   Wt 137 lb (62.1 kg)   BMI 23.52 kg/m  Vitals:   12/08/18 0839  Weight: 137 lb (62.1 kg)     Physical Exam Vitals signs reviewed.  Constitutional:      General: She is not in acute distress.    Appearance: Normal appearance. She is well-developed. She is not ill-appearing.  HENT:     Head: Normocephalic and atraumatic.  Neck:     Musculoskeletal: Normal range of motion and neck supple.  Pulmonary:     Effort: Pulmonary effort is normal. No respiratory distress.  Neurological:     Mental Status: She is alert.  Psychiatric:        Mood and Affect: Mood normal.  Behavior: Behavior normal.        Thought Content: Thought content normal.        Judgment: Judgment normal.         Assessment & Plan    1. Abdominal bloating Improving with taking omeprazole 40mg  BID. Continue. She will be following up with GI in 4 weeks.   2. Rib pain on right side She has been referred to pulmonology and awaiting appt. If pulm work up is negative and being that she has had a negative [place], I would question if she may have some scar tissue or nerve irritation as this was also the side of her mastectomy for breast cancer. May consider this if all other possibilities are  excluded.   3. Chronic idiopathic constipation Improving with Linzess. Advised to take daily. Push fluids and high fiber diet.     I discussed the assessment and treatment plan with the patient. The patient was provided an opportunity to ask questions and all were answered. The patient agreed with the plan and demonstrated an understanding of the instructions.   The patient was advised to call back or seek an in-person evaluation if the symptoms worsen or if the condition fails to improve as anticipated.  I provided 17 minutes of non-face-to-face time during this encounter.  Mar Daring, PA-C  [facility]

## 2018-12-09 DIAGNOSIS — H2511 Age-related nuclear cataract, right eye: Secondary | ICD-10-CM | POA: Diagnosis not present

## 2018-12-09 DIAGNOSIS — E119 Type 2 diabetes mellitus without complications: Secondary | ICD-10-CM | POA: Diagnosis not present

## 2018-12-09 DIAGNOSIS — R0602 Shortness of breath: Secondary | ICD-10-CM | POA: Diagnosis not present

## 2018-12-09 DIAGNOSIS — I10 Essential (primary) hypertension: Secondary | ICD-10-CM | POA: Diagnosis not present

## 2018-12-09 DIAGNOSIS — J449 Chronic obstructive pulmonary disease, unspecified: Secondary | ICD-10-CM | POA: Diagnosis not present

## 2018-12-29 DIAGNOSIS — K219 Gastro-esophageal reflux disease without esophagitis: Secondary | ICD-10-CM | POA: Diagnosis not present

## 2018-12-29 DIAGNOSIS — K5900 Constipation, unspecified: Secondary | ICD-10-CM | POA: Diagnosis not present

## 2018-12-29 DIAGNOSIS — R140 Abdominal distension (gaseous): Secondary | ICD-10-CM | POA: Diagnosis not present

## 2018-12-29 DIAGNOSIS — R1310 Dysphagia, unspecified: Secondary | ICD-10-CM | POA: Diagnosis not present

## 2019-01-03 DIAGNOSIS — R0789 Other chest pain: Secondary | ICD-10-CM | POA: Diagnosis not present

## 2019-01-03 DIAGNOSIS — R0600 Dyspnea, unspecified: Secondary | ICD-10-CM | POA: Diagnosis not present

## 2019-01-13 DIAGNOSIS — E782 Mixed hyperlipidemia: Secondary | ICD-10-CM | POA: Diagnosis not present

## 2019-01-13 DIAGNOSIS — F419 Anxiety disorder, unspecified: Secondary | ICD-10-CM | POA: Diagnosis not present

## 2019-01-13 DIAGNOSIS — Z853 Personal history of malignant neoplasm of breast: Secondary | ICD-10-CM | POA: Diagnosis not present

## 2019-01-13 DIAGNOSIS — I208 Other forms of angina pectoris: Secondary | ICD-10-CM | POA: Diagnosis not present

## 2019-01-13 DIAGNOSIS — E785 Hyperlipidemia, unspecified: Secondary | ICD-10-CM | POA: Diagnosis not present

## 2019-01-13 DIAGNOSIS — R0602 Shortness of breath: Secondary | ICD-10-CM | POA: Diagnosis not present

## 2019-01-13 DIAGNOSIS — K2270 Barrett's esophagus without dysplasia: Secondary | ICD-10-CM | POA: Diagnosis not present

## 2019-01-17 ENCOUNTER — Encounter: Payer: PPO | Admitting: Physician Assistant

## 2019-01-20 DIAGNOSIS — I208 Other forms of angina pectoris: Secondary | ICD-10-CM | POA: Diagnosis not present

## 2019-01-31 ENCOUNTER — Other Ambulatory Visit: Payer: Self-pay | Admitting: Physician Assistant

## 2019-01-31 DIAGNOSIS — Z1231 Encounter for screening mammogram for malignant neoplasm of breast: Secondary | ICD-10-CM

## 2019-02-02 ENCOUNTER — Other Ambulatory Visit: Payer: Self-pay | Admitting: Physician Assistant

## 2019-02-02 DIAGNOSIS — R9431 Abnormal electrocardiogram [ECG] [EKG]: Secondary | ICD-10-CM | POA: Diagnosis not present

## 2019-02-02 DIAGNOSIS — R0602 Shortness of breath: Secondary | ICD-10-CM | POA: Diagnosis not present

## 2019-02-02 DIAGNOSIS — K2270 Barrett's esophagus without dysplasia: Secondary | ICD-10-CM | POA: Diagnosis not present

## 2019-02-02 DIAGNOSIS — F419 Anxiety disorder, unspecified: Secondary | ICD-10-CM | POA: Diagnosis not present

## 2019-02-02 DIAGNOSIS — E785 Hyperlipidemia, unspecified: Secondary | ICD-10-CM | POA: Diagnosis not present

## 2019-02-02 DIAGNOSIS — I208 Other forms of angina pectoris: Secondary | ICD-10-CM | POA: Diagnosis not present

## 2019-02-02 DIAGNOSIS — E782 Mixed hyperlipidemia: Secondary | ICD-10-CM | POA: Diagnosis not present

## 2019-02-02 DIAGNOSIS — Z853 Personal history of malignant neoplasm of breast: Secondary | ICD-10-CM | POA: Diagnosis not present

## 2019-02-02 DIAGNOSIS — E559 Vitamin D deficiency, unspecified: Secondary | ICD-10-CM

## 2019-02-10 NOTE — Progress Notes (Signed)
Patient: Kristen Hays, Female    DOB: 25-Feb-1946, 73 y.o.   MRN: [id number] Visit Date: 02/11/2019  Today's Provider: Mar Daring, PA-C   Chief Complaint  Patient presents with  . Annual Exam   Subjective:     Annual physical exam Kristen Hays is a 73 y.o. female who presents today for health maintenance and complete physical. She feels fairly well. She reports exercising none. She reports she is sleeping well. ----------------------------------------------------------------- Mammogram Scheduled 03/11/2019   Review of Systems  Constitutional: Negative.   HENT: Negative.   Eyes: Negative.   Respiratory: Positive for shortness of breath.   Cardiovascular: Negative.   Gastrointestinal: Positive for abdominal distention.  Endocrine: Negative.   Genitourinary: Negative.   Musculoskeletal: Negative.   Skin: Negative.   Allergic/Immunologic: Negative.   Neurological: Positive for headaches.  Hematological: Negative.   Psychiatric/Behavioral: Negative.     Social History      She  reports that she has never smoked. She has never used smokeless tobacco. She reports that she does not drink alcohol or use drugs.       Social History   Socioeconomic History  . Marital status: Married    Spouse name: Not on file  . Number of children: 2  . Years of education: HS  . Highest education level: Not on file  Occupational History    Employer: [employer]  Social Needs  . Financial resource strain: Not on file  . Food insecurity    Worry: Not on file    Inability: Not on file  . Transportation needs    Medical: Not on file    Non-medical: Not on file  Tobacco Use  . Smoking status: Never Smoker  . Smokeless tobacco: Never Used  Substance and Sexual Activity  . Alcohol use: No  . Drug use: No  . Sexual activity: Yes    Birth control/protection: Post-menopausal  Lifestyle  . Physical activity    Days per week: Not on file    Minutes  per session: Not on file  . Stress: Not on file  Relationships  . Social Herbalist on phone: Not on file    Gets together: Not on file    Attends religious service: Not on file    Active member of club or organization: Not on file    Attends meetings of clubs or organizations: Not on file    Relationship status: Not on file  Other Topics Concern  . Not on file  Social History Narrative  . Not on file    Past Medical History:  Diagnosis Date  . Anxiety   . Barrett esophagus 2016  . Breast cancer ([place])    right breast c Mastectomy   . Dysphagia   . High cholesterol   . Hypothyroidism      Patient Active Problem List   Diagnosis Date Noted  . History of cancer of right breast 01/08/2017  . S/P mastectomy, right 01/08/2017  . Depression, major, single episode, mild ([place]) 01/08/2017  . Dermatitis 02/26/2016  . Tarsal tunnel syndrome of right side 06/21/2015  . Anxiety 05/18/2015  . Globus hystericus 05/18/2015  . Blood in the urine 05/18/2015  . 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 HLD (hyperlipidemia) 05/18/2015  . LBP (low back pain) 05/18/2015  . 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 Cervical pain 05/18/2015  . Barrett esophagus 03/04/2015  . H/O adenomatous polyp of colon 03/04/2015  .  Abdominal bloating 12/28/2014    Past Surgical History:  Procedure Laterality Date  . AUGMENTATION MAMMAPLASTY Bilateral   . BREAST SURGERY     mastectomy-right; breast augmentation  . CATARACT EXTRACTION W/PHACO Left 07/21/2016   Procedure: CATARACT EXTRACTION PHACO AND INTRAOCULAR LENS PLACEMENT (IOC);  Surgeon: Ronnell Freshwater, MD;  Location: [facility];  Service: Ophthalmology;  Laterality: Left;  LEFT  . CESAREAN SECTION     1974/1979  . COLONOSCOPY N/A 01/17/2015   Procedure: COLONOSCOPY;  Surgeon: Manya Silvas, MD;  Location: [facility] ENDOSCOPY;  Service: Endoscopy;  Laterality: N/A;  . ESOPHAGOGASTRODUODENOSCOPY N/A 01/17/2015   Procedure:  ESOPHAGOGASTRODUODENOSCOPY (EGD);  Surgeon: Manya Silvas, MD;  Location: [facility] ENDOSCOPY;  Service: Endoscopy;  Laterality: N/A;  . ESOPHAGOGASTRODUODENOSCOPY (EGD) WITH PROPOFOL N/A 02/17/2018   Procedure: ESOPHAGOGASTRODUODENOSCOPY (EGD) WITH PROPOFOL;  Surgeon: Manya Silvas, MD;  Location: [facility] ENDOSCOPY;  Service: Endoscopy;  Laterality: N/A;  . EYE SURGERY Right 08/2014  . MASTECTOMY Right 2011  . SAVORY DILATION N/A 01/17/2015   Procedure: SAVORY DILATION;  Surgeon: Manya Silvas, MD;  Location: [facility] ENDOSCOPY;  Service: Endoscopy;  Laterality: N/A;  . TONSILLECTOMY  2011    Family History        Family Status  Relation Name Status  . Mother  Deceased  . Father  Deceased  . Sister  Alive  . Sister  Alive  . Brother  Alive  . Sister  Alive  . Ethlyn Daniels  (Not Specified)  . Cousin  (Not Specified)        Her family history includes Alzheimer's disease in her mother; Bone cancer in her father; Breast cancer in her cousin, paternal aunt, and sister; Cancer in her sister; Diabetes in her mother and sister; Healthy in her brother and sister; Irritable bowel syndrome in her sister; Stroke in her mother; Vascular Disease in her father.      No Known Allergies   Current Outpatient Medications:  .  aspirin EC 81 MG tablet, Take by mouth., Disp: , Rfl:  .  Cholecalciferol (VITAMIN D) 2000 UNITS CAPS, Take 1 capsule by mouth daily., Disp: , Rfl:  .  Cyanocobalamin (VITAMIN B12 PO), Take 2,500 mg by mouth., Disp: , Rfl:  .  escitalopram (LEXAPRO) 10 MG tablet, START WITH 1/2 TABLET BY MOUTH NIGHTLY AT BEDTIME FOR 1 WEEK THEN INCREASE TO 1 TAB, Disp: , Rfl:  .  isosorbide mononitrate (IMDUR) 30 MG 24 hr tablet, Take by mouth., Disp: , Rfl:  .  linaclotide (LINZESS) 72 MCG capsule, Take by mouth., Disp: , Rfl:  .  pantoprazole (PROTONIX) 40 MG tablet, Take by mouth., Disp: , Rfl:  .  PROAIR HFA 108 (90 Base) MCG/ACT inhaler, INHALE 2 PUFFS BY MOUTH EVERY 6 HOURS AS NEEDED FOR  WHEEZE, Disp: , Rfl:  .  Vitamin D, Ergocalciferol, (DRISDOL) 1.25 MG (50000 UT) CAPS capsule, TAKE 1 CAPSULE (50,000 UNITS TOTAL) BY MOUTH EVERY 7 (SEVEN) DAYS., Disp: 12 capsule, Rfl: 0   Patient Care Team: Rubye Beach as PCP - General (Family Medicine)    Objective:    Vitals: BP 114/69 (BP Location: Left Arm, Patient Position: Sitting, Cuff Size: Large)   Pulse 65   Temp 97.6 F (36.4 C) (Oral)   Resp 16   Ht 5\' 4"  (1.626 m)   Wt 136 lb 12.8 oz (62.1 kg)   BMI 23.48 kg/m    Vitals:   02/11/19 1115  BP: 114/69  Pulse: 65  Resp: 16  Temp: 97.6 F (36.4 C)  TempSrc: Oral  Weight: 136 lb 12.8 oz (62.1 kg)  Height: 5\' 4"  (1.626 m)     Physical Exam Constitutional:      General: She is not in acute distress.    Appearance: Normal appearance. She is normal weight. She is not ill-appearing.  HENT:     Head: Normocephalic and atraumatic.     Right Ear: Tympanic membrane, ear canal and external ear normal.     Left Ear: Tympanic membrane, ear canal and external ear normal.     Nose: Nose normal.     Mouth/Throat:     Mouth: Mucous membranes are moist.     Pharynx: Oropharynx is clear.  Eyes:     Extraocular Movements: Extraocular movements intact.     Conjunctiva/sclera: Conjunctivae normal.     Pupils: Pupils are equal, round, and reactive to light.  Neck:     Musculoskeletal: Normal range of motion and neck supple.  Cardiovascular:     Rate and Rhythm: Normal rate and regular rhythm.     Pulses: Normal pulses.     Heart sounds: Normal heart sounds.  Pulmonary:     Effort: Pulmonary effort is normal.     Breath sounds: Normal breath sounds.  Abdominal:     General: Abdomen is flat. Bowel sounds are normal.  Musculoskeletal: Normal range of motion.  Skin:    General: Skin is warm and dry.     Capillary Refill: Capillary refill takes less than 2 seconds.  Neurological:     General: No focal deficit present.     Mental Status: She is alert and  oriented to person, place, and time. Mental status is at baseline.     Cranial Nerves: No cranial nerve deficit.     Motor: No weakness.     Gait: Gait normal.  Psychiatric:        Mood and Affect: Mood normal.        Behavior: Behavior normal.        Thought Content: Thought content normal.        Judgment: Judgment normal.    Fall Risk  11/10/2018 01/12/2018 01/08/2017 11/12/2016 01/07/2016  Falls in the past year? 1 Yes No No No  Number falls in past yr: 1 2 or more - - -  Injury with Fall? 0 No - - -    Depression Screen PHQ 2/9 Scores 02/11/2019 11/10/2018 01/12/2018 10/23/2017  PHQ - 2 Score 0 0 1 1  PHQ- 9 Score 1 - 4 7   6CIT Screen 02/11/2019  What Year? 0 points  What month? 0 points  What time? 0 points  Count back from 20 0 points  Months in reverse 0 points  Repeat phrase 2 points  Total Score 2         Assessment & Plan:     Routine Health Maintenance and Physical Exam  Exercise Activities and Dietary recommendations Goals   None     Immunization History  Administered Date(s) Administered  . Influenza Split 06/15/2012  . Influenza, High Dose Seasonal PF 06/06/2015, 06/07/2018  . Pneumococcal Conjugate-13 12/09/2013  . Pneumococcal Polysaccharide-23 01/07/2016    Health Maintenance  Topic Date Due  . TETANUS/TDAP  05/03/1966  . COLONOSCOPY  01/16/2018  . INFLUENZA VACCINE  04/02/2019  . MAMMOGRAM  02/06/2020  . DEXA SCAN  Completed  . Hepatitis C Screening  Completed  . PNA vac Low Risk Adult  Completed  Discussed health benefits of physical activity, and encouraged her to engage in regular exercise appropriate for her age and condition.    1. Annual physical exam Normal physical exam today. Will check labs as below and f/u pending lab results. If labs are stable and WNL she will not need to have these rechecked for one year at her next annual physical exam. She is to call the office in the meantime if she has any acute issue, questions or  concerns.  2. Pure hypercholesterolemia Diet controlled. Patient has declined medications in the past. Will check labs as below and f/u pending results. - Hemoglobin A1c - Lipid panel  3. Barrett's esophagus without dysplasia Was low at 16.6 in March. Rechecking to see if ok to transition to OTC dosing vs the higher once weekly dose.  - Vitamin D (25 hydroxy)  4. H/O adenomatous polyp of colon Stable. Constipation controlled with Linzess. Followed by Gastroenterology. Due for colonoscopy next year.   5. Depression, major, single episode, mild ([facility]) Improved. PHQ9 was 1 today. Continue Lexapro 10mg  daily.   --------------------------------------------------------------------    Mar Daring, PA-C  [facility]

## 2019-02-11 ENCOUNTER — Other Ambulatory Visit: Payer: Self-pay

## 2019-02-11 ENCOUNTER — Other Ambulatory Visit: Payer: Self-pay | Admitting: Physician Assistant

## 2019-02-11 ENCOUNTER — Ambulatory Visit (INDEPENDENT_AMBULATORY_CARE_PROVIDER_SITE_OTHER): Payer: PPO | Admitting: Physician Assistant

## 2019-02-11 ENCOUNTER — Encounter: Payer: Self-pay | Admitting: Physician Assistant

## 2019-02-11 VITALS — BP 114/69 | HR 65 | Temp 97.6°F | Resp 16 | Ht 64.0 in | Wt 136.8 lb

## 2019-02-11 DIAGNOSIS — Z86010 Personal history of colonic polyps: Secondary | ICD-10-CM | POA: Diagnosis not present

## 2019-02-11 DIAGNOSIS — K2270 Barrett's esophagus without dysplasia: Secondary | ICD-10-CM | POA: Diagnosis not present

## 2019-02-11 DIAGNOSIS — Z0000 Encounter for general adult medical examination without abnormal findings: Secondary | ICD-10-CM | POA: Diagnosis not present

## 2019-02-11 DIAGNOSIS — F320 Major depressive disorder, single episode, mild: Secondary | ICD-10-CM

## 2019-02-11 DIAGNOSIS — E7800 Pure hypercholesterolemia, unspecified: Secondary | ICD-10-CM

## 2019-02-11 NOTE — Patient Instructions (Signed)
Health Maintenance After Age 72 After age 72, you are at a higher risk for certain long-term diseases and infections as well as injuries from falls. Falls are a major cause of broken bones and head injuries in people who are older than age 72. Getting regular preventive care can help to keep you healthy and well. Preventive care includes getting regular testing and making lifestyle changes as recommended by your health care provider. Talk with your health care provider about:  Which screenings and tests you should have. A screening is a test that checks for a disease when you have no symptoms.  A diet and exercise plan that is right for you. What should I know about screenings and tests to prevent falls? Screening and testing are the best ways to find a health problem early. Early diagnosis and treatment give you the best chance of managing medical conditions that are common after age 72. Certain conditions and lifestyle choices may make you more likely to have a fall. Your health care provider may recommend:  Regular vision checks. Poor vision and conditions such as cataracts can make you more likely to have a fall. If you wear glasses, make sure to get your prescription updated if your vision changes.  Medicine review. Work with your health care provider to regularly review all of the medicines you are taking, including over-the-counter medicines. Ask your health care provider about any side effects that may make you more likely to have a fall. Tell your health care provider if any medicines that you take make you feel dizzy or sleepy.  Osteoporosis screening. Osteoporosis is a condition that causes the bones to get weaker. This can make the bones weak and cause them to break more easily.  Blood pressure screening. Blood pressure changes and medicines to control blood pressure can make you feel dizzy.  Strength and balance checks. Your health care provider may recommend certain tests to check your  strength and balance while standing, walking, or changing positions.  Foot health exam. Foot pain and numbness, as well as not wearing proper footwear, can make you more likely to have a fall.  Depression screening. You may be more likely to have a fall if you have a fear of falling, feel emotionally low, or feel unable to do activities that you used to do.  Alcohol use screening. Using too much alcohol can affect your balance and may make you more likely to have a fall. What actions can I take to lower my risk of falls? General instructions  Talk with your health care provider about your risks for falling. Tell your health care provider if: ? You fall. Be sure to tell your health care provider about all falls, even ones that seem minor. ? You feel dizzy, sleepy, or off-balance.  Take over-the-counter and prescription medicines only as told by your health care provider. These include any supplements.  Eat a healthy diet and maintain a healthy weight. A healthy diet includes low-fat dairy products, low-fat (lean) meats, and fiber from whole grains, beans, and lots of fruits and vegetables. Home safety  Remove any tripping hazards, such as rugs, cords, and clutter.  Install safety equipment such as grab bars in bathrooms and safety rails on stairs.  Keep rooms and walkways well-lit. Activity   Follow a regular exercise program to stay fit. This will help you maintain your balance. Ask your health care provider what types of exercise are appropriate for you.  If you need a cane or   walker, use it as recommended by your health care provider.  Wear supportive shoes that have nonskid soles. Lifestyle  Do not drink alcohol if your health care provider tells you not to drink.  If you drink alcohol, limit how much you have: ? 0-1 drink a day for women. ? 0-2 drinks a day for men.  Be aware of how much alcohol is in your drink. In the [place] one drink equals one typical bottle of beer (12  oz), one-half glass of wine (5 oz), or one shot of hard liquor (1 oz).  Do not use any products that contain nicotine or tobacco, such as cigarettes and e-cigarettes. If you need help quitting, ask your health care provider. Summary  Having a healthy lifestyle and getting preventive care can help to protect your health and wellness after age 72.  Screening and testing are the best way to find a health problem early and help you avoid having a fall. Early diagnosis and treatment give you the best chance for managing medical conditions that are more common for people who are older than age 72.  Falls are a major cause of broken bones and head injuries in people who are older than age 72. Take precautions to prevent a fall at home.  Work with your health care provider to learn what changes you can make to improve your health and wellness and to prevent falls. This information is not intended to replace advice given to you by your health care provider. Make sure you discuss any questions you have with your health care provider. Document Released: 07/01/2017 Document Revised: 07/01/2017 Document Reviewed: 07/01/2017 Elsevier Interactive Patient Education  2019 Elsevier Inc.  

## 2019-02-12 LAB — LIPID PANEL
Chol/HDL Ratio: 5.7 ratio — ABNORMAL HIGH (ref 0.0–4.4)
Cholesterol, Total: 243 mg/dL — ABNORMAL HIGH (ref 100–199)
HDL: 43 mg/dL (ref >39)
LDL Calculated: 170 mg/dL — ABNORMAL HIGH (ref 0–99)
Triglycerides: 152 mg/dL — ABNORMAL HIGH (ref 0–149)
VLDL Cholesterol Cal: 30 mg/dL (ref 5–40)

## 2019-02-12 LAB — HEMOGLOBIN A1C
Est. average glucose Bld gHb Est-mCnc: 111 mg/dL
Hgb A1c MFr Bld: 5.5 % (ref 4.8–5.6)

## 2019-02-12 LAB — VITAMIN D 25 HYDROXY (VIT D DEFICIENCY, FRACTURES): Vit D, 25-Hydroxy: 36.9 ng/mL (ref 30.0–100.0)

## 2019-02-14 ENCOUNTER — Telehealth: Payer: Self-pay

## 2019-02-14 DIAGNOSIS — E7800 Pure hypercholesterolemia, unspecified: Secondary | ICD-10-CM

## 2019-02-14 MED ORDER — ATORVASTATIN CALCIUM 10 MG PO TABS: 10.0000 mg | ORAL_TABLET | Freq: Every day | ORAL | 3 refills | Status: DC

## 2019-02-14 NOTE — Telephone Encounter (Signed)
Atorvastatin 10 mg

## 2019-02-14 NOTE — Telephone Encounter (Signed)
Patient advised as directed below.Per patient she doesn't have no more of the Atorvastatin and if you can please send in a new prescription.

## 2019-02-14 NOTE — Telephone Encounter (Signed)
-----   Message from Mar Daring, [place] sent at 02/14/2019 11:40 AM EDT ----- Cholesterol has increased since last check. Would recommend to restart atorvastatin (or a different statin if she had side effects with atorvastatin). A1c is normal at 5.5. Vit D is normal. Recommend to continue OTC Vit D of 1000-2000 IU daily. Does not require the high dose Rx once weekly any longer.

## 2019-02-23 DIAGNOSIS — K219 Gastro-esophageal reflux disease without esophagitis: Secondary | ICD-10-CM | POA: Diagnosis not present

## 2019-02-23 DIAGNOSIS — R140 Abdominal distension (gaseous): Secondary | ICD-10-CM | POA: Diagnosis not present

## 2019-02-23 DIAGNOSIS — K5900 Constipation, unspecified: Secondary | ICD-10-CM | POA: Diagnosis not present

## 2019-03-11 ENCOUNTER — Telehealth: Payer: Self-pay

## 2019-03-11 ENCOUNTER — Other Ambulatory Visit: Payer: Self-pay

## 2019-03-11 ENCOUNTER — Other Ambulatory Visit: Payer: Self-pay | Admitting: Physician Assistant

## 2019-03-11 ENCOUNTER — Ambulatory Visit
Admission: RE | Admit: 2019-03-11 | Discharge: 2019-03-11 | Disposition: A | Discharge status: Home / Self Care | Payer: PPO | Source: Ambulatory Visit | Attending: Physician Assistant | Admitting: Physician Assistant

## 2019-03-11 DIAGNOSIS — Z1231 Encounter for screening mammogram for malignant neoplasm of breast: Secondary | ICD-10-CM | POA: Insufficient documentation

## 2019-03-11 NOTE — Telephone Encounter (Signed)
-----   Message from Mar Daring, PA-C sent at 03/11/2019  9:20 AM EDT ----- Normal mammogram. Repeat screening in one year.

## 2019-03-11 NOTE — Telephone Encounter (Signed)
Patient advised as directed below. 

## 2019-03-12 ENCOUNTER — Other Ambulatory Visit: Payer: Self-pay | Admitting: Physician Assistant

## 2019-03-12 DIAGNOSIS — F321 Major depressive disorder, single episode, moderate: Secondary | ICD-10-CM

## 2019-04-04 DIAGNOSIS — R0600 Dyspnea, unspecified: Secondary | ICD-10-CM | POA: Diagnosis not present

## 2019-04-04 DIAGNOSIS — R0789 Other chest pain: Secondary | ICD-10-CM | POA: Diagnosis not present

## 2019-04-27 DIAGNOSIS — K219 Gastro-esophageal reflux disease without esophagitis: Secondary | ICD-10-CM | POA: Diagnosis not present

## 2019-04-27 DIAGNOSIS — Z853 Personal history of malignant neoplasm of breast: Secondary | ICD-10-CM | POA: Diagnosis not present

## 2019-04-27 DIAGNOSIS — C50919 Malignant neoplasm of unspecified site of unspecified female breast: Secondary | ICD-10-CM | POA: Diagnosis not present

## 2019-04-27 DIAGNOSIS — K2270 Barrett's esophagus without dysplasia: Secondary | ICD-10-CM | POA: Diagnosis not present

## 2019-04-27 DIAGNOSIS — R079 Chest pain, unspecified: Secondary | ICD-10-CM | POA: Diagnosis not present

## 2019-04-27 DIAGNOSIS — I208 Other forms of angina pectoris: Secondary | ICD-10-CM | POA: Diagnosis not present

## 2019-04-27 DIAGNOSIS — E785 Hyperlipidemia, unspecified: Secondary | ICD-10-CM | POA: Diagnosis not present

## 2019-04-27 DIAGNOSIS — R9431 Abnormal electrocardiogram [ECG] [EKG]: Secondary | ICD-10-CM | POA: Diagnosis not present

## 2019-04-27 DIAGNOSIS — R0602 Shortness of breath: Secondary | ICD-10-CM | POA: Diagnosis not present

## 2019-04-27 DIAGNOSIS — E782 Mixed hyperlipidemia: Secondary | ICD-10-CM | POA: Diagnosis not present

## 2019-04-27 DIAGNOSIS — Z01818 Encounter for other preprocedural examination: Secondary | ICD-10-CM | POA: Diagnosis not present

## 2019-04-27 DIAGNOSIS — F419 Anxiety disorder, unspecified: Secondary | ICD-10-CM | POA: Diagnosis not present

## 2019-04-28 ENCOUNTER — Other Ambulatory Visit: Payer: Self-pay | Admitting: Physician Assistant

## 2019-04-28 ENCOUNTER — Other Ambulatory Visit: Payer: Self-pay | Admitting: Internal Medicine

## 2019-04-28 DIAGNOSIS — E559 Vitamin D deficiency, unspecified: Secondary | ICD-10-CM

## 2019-04-28 DIAGNOSIS — R079 Chest pain, unspecified: Secondary | ICD-10-CM

## 2019-04-29 DIAGNOSIS — Z01818 Encounter for other preprocedural examination: Secondary | ICD-10-CM | POA: Diagnosis not present

## 2019-05-05 ENCOUNTER — Other Ambulatory Visit: Payer: Self-pay

## 2019-05-05 ENCOUNTER — Ambulatory Visit
Admission: RE | Admit: 2019-05-05 | Discharge: 2019-05-05 | Disposition: A | Discharge status: Home / Self Care | Payer: PPO | Source: Ambulatory Visit | Attending: Internal Medicine | Admitting: Internal Medicine

## 2019-05-05 DIAGNOSIS — J984 Other disorders of lung: Secondary | ICD-10-CM | POA: Diagnosis not present

## 2019-05-05 DIAGNOSIS — R079 Chest pain, unspecified: Secondary | ICD-10-CM

## 2019-05-05 MED ORDER — IOHEXOL 300 MG/ML  SOLN
75.0000 mL | Freq: Once | INTRAMUSCULAR | Status: AC | PRN
  Administered 2019-05-05: 09:00:00 75 mL via INTRAVENOUS

## 2019-05-18 DIAGNOSIS — H02403 Unspecified ptosis of bilateral eyelids: Secondary | ICD-10-CM | POA: Diagnosis not present

## 2019-05-23 DIAGNOSIS — E785 Hyperlipidemia, unspecified: Secondary | ICD-10-CM | POA: Diagnosis not present

## 2019-05-23 DIAGNOSIS — R9431 Abnormal electrocardiogram [ECG] [EKG]: Secondary | ICD-10-CM | POA: Diagnosis not present

## 2019-05-23 DIAGNOSIS — R079 Chest pain, unspecified: Secondary | ICD-10-CM | POA: Diagnosis not present

## 2019-05-23 DIAGNOSIS — E782 Mixed hyperlipidemia: Secondary | ICD-10-CM | POA: Diagnosis not present

## 2019-05-23 DIAGNOSIS — I208 Other forms of angina pectoris: Secondary | ICD-10-CM | POA: Diagnosis not present

## 2019-05-23 DIAGNOSIS — K2270 Barrett's esophagus without dysplasia: Secondary | ICD-10-CM | POA: Diagnosis not present

## 2019-05-23 DIAGNOSIS — K219 Gastro-esophageal reflux disease without esophagitis: Secondary | ICD-10-CM | POA: Diagnosis not present

## 2019-05-23 DIAGNOSIS — R0602 Shortness of breath: Secondary | ICD-10-CM | POA: Diagnosis not present

## 2019-05-23 DIAGNOSIS — F419 Anxiety disorder, unspecified: Secondary | ICD-10-CM | POA: Diagnosis not present

## 2019-07-18 ENCOUNTER — Ambulatory Visit (INDEPENDENT_AMBULATORY_CARE_PROVIDER_SITE_OTHER): Payer: PPO | Admitting: Physician Assistant

## 2019-07-18 ENCOUNTER — Encounter: Payer: Self-pay | Admitting: Physician Assistant

## 2019-07-18 ENCOUNTER — Other Ambulatory Visit: Payer: Self-pay

## 2019-07-18 VITALS — BP 119/76 | HR 76 | Temp 97.5°F | Resp 16 | Wt 139.0 lb

## 2019-07-18 DIAGNOSIS — Z853 Personal history of malignant neoplasm of breast: Secondary | ICD-10-CM

## 2019-07-18 DIAGNOSIS — N644 Mastodynia: Secondary | ICD-10-CM

## 2019-07-18 DIAGNOSIS — G629 Polyneuropathy, unspecified: Secondary | ICD-10-CM

## 2019-07-18 MED ORDER — GABAPENTIN 100 MG PO CAPS: 100.0000 mg | ORAL_CAPSULE | Freq: Every day | ORAL | 1 refills | Status: DC

## 2019-07-18 NOTE — Patient Instructions (Signed)

## 2019-07-18 NOTE — Progress Notes (Signed)
Patient: Kristen Hays Female    DOB: [birth year]   73 y.o.   MRN: [id number] Visit Date: 07/18/2019  Today's Provider: Mar Daring, PA-C   Chief Complaint  Patient presents with  . Breast Pain   Subjective:     HPI  Patient here today with c/o breast pain, right side. It hurts under her arm and feels sore. Pain radiates in the back of her shoulder.She has a mastectomy back in 2010-2011.  She has had a normal mammogram in July 2020 and also has had a chest CT in Sept 2020 that did not show any cause for her pain or recurrence of malignancy. She reports pain feels like the underwire of a bra poking out and sticking her in her under breast area under the right implant. She does have personal history of breast cancer with mastectomy of the right breast.   No Known Allergies   Current Outpatient Medications:  .  aspirin EC 81 MG tablet, Take by mouth., Disp: , Rfl:  .  atorvastatin (LIPITOR) 10 MG tablet, Take 1 tablet (10 mg total) by mouth daily., Disp: 90 tablet, Rfl: 3 .  Cholecalciferol (VITAMIN D) 2000 UNITS CAPS, Take 1 capsule by mouth daily., Disp: , Rfl:  .  Cyanocobalamin (VITAMIN B12 PO), Take 2,500 mg by mouth., Disp: , Rfl:  .  escitalopram (LEXAPRO) 10 MG tablet, START WITH 1/2 TABLET BY MOUTH NIGHTLY AT BEDTIME FOR 1 WEEK THEN INCREASE TO 1 TAB, Disp: 90 tablet, Rfl: 1 .  isosorbide mononitrate (IMDUR) 30 MG 24 hr tablet, Take by mouth., Disp: , Rfl:  .  linaclotide (LINZESS) 72 MCG capsule, Take by mouth., Disp: , Rfl:  .  pantoprazole (PROTONIX) 40 MG tablet, Take by mouth., Disp: , Rfl:  .  PROAIR HFA 108 (90 Base) MCG/ACT inhaler, INHALE 2 PUFFS BY MOUTH EVERY 6 HOURS AS NEEDED FOR WHEEZE, Disp: , Rfl:  .  Vitamin D, Ergocalciferol, (DRISDOL) 1.25 MG (50000 UT) CAPS capsule, TAKE 1 CAPSULE (50,000 UNITS TOTAL) BY MOUTH EVERY 7 (SEVEN) DAYS., Disp: 12 capsule, Rfl: 0  Review of Systems  Constitutional: Positive for fatigue. Negative for appetite  change, chills and fever.  Respiratory: Negative for chest tightness and shortness of breath.   Cardiovascular: Positive for chest pain (MSK and tender under right breast that radiates to right axilla). Negative for palpitations.  Gastrointestinal: Negative for abdominal pain, nausea and vomiting.  Neurological: Negative for dizziness and weakness.    Social History   Tobacco Use  . Smoking status: Never Smoker  . Smokeless tobacco: Never Used  Substance Use Topics  . Alcohol use: No      Objective:   BP 119/76 (BP Location: Left Arm, Patient Position: Sitting, Cuff Size: Large)   Pulse 76   Temp (!) 97.5 F (36.4 C) (Other (Comment))   Resp 16   Wt 139 lb (63 kg)   BMI 23.86 kg/m  Vitals:   07/18/19 1341  BP: 119/76  Pulse: 76  Resp: 16  Temp: (!) 97.5 F (36.4 C)  TempSrc: Other (Comment)  Weight: 139 lb (63 kg)  Body mass index is 23.86 kg/m.   Physical Exam Vitals signs reviewed.  Constitutional:      General: She is not in acute distress.    Appearance: Normal appearance. She is well-developed. She is not ill-appearing or diaphoretic.  Neck:     Musculoskeletal: Normal range of motion and neck supple.  Thyroid: No thyromegaly.     Vascular: No JVD.     Trachea: No tracheal deviation.  Cardiovascular:     Rate and Rhythm: Normal rate and regular rhythm.     Heart sounds: Normal heart sounds. No murmur. No friction rub. No gallop.   Pulmonary:     Effort: Pulmonary effort is normal. No respiratory distress.     Breath sounds: Normal breath sounds. No wheezing or rales.  Chest:     Breasts:        Right: No mass, skin change or tenderness.        Left: Normal. No mass, nipple discharge, skin change or tenderness.    Lymphadenopathy:     Cervical: No cervical adenopathy.     Upper Body:     Right upper body: No supraclavicular, axillary or pectoral adenopathy.     Left upper body: No supraclavicular, axillary or pectoral adenopathy.  Neurological:      Mental Status: She is alert.      No results found for any visits on 07/18/19.     Assessment & Plan    1. Neuropathy Discussed scar tissue and most likely nerve pain secondary to this. Will try gabapentin as below. Patient may dose up 100mg  per week to max of 300mg  for when she returns in 4 weeks. Also discussed possibility of pain coming from an old implant and needing replacement vs explantation (including discussed breast implant illness). She will consider this while trying Gabapentin. Will f/u in 4 weeks.  - gabapentin (NEURONTIN) 100 MG capsule; Take 1 capsule (100 mg total) by mouth at bedtime.  Dispense: 30 capsule; Refill: 1  2. Breast pain, right See above medical treatment plan.  3. History of cancer of right breast See above medical treatment plan.     Mar Daring, PA-C  [facility]

## 2019-08-15 ENCOUNTER — Ambulatory Visit (INDEPENDENT_AMBULATORY_CARE_PROVIDER_SITE_OTHER): Payer: PPO | Admitting: Physician Assistant

## 2019-08-15 ENCOUNTER — Encounter: Payer: Self-pay | Admitting: Physician Assistant

## 2019-08-15 ENCOUNTER — Other Ambulatory Visit: Payer: Self-pay

## 2019-08-15 VITALS — BP 120/72 | HR 72 | Temp 97.1°F | Resp 16 | Wt 140.2 lb

## 2019-08-15 DIAGNOSIS — G629 Polyneuropathy, unspecified: Secondary | ICD-10-CM

## 2019-08-15 MED ORDER — GABAPENTIN 600 MG PO TABS: 300.0000 mg | ORAL_TABLET | Freq: Every day | ORAL | 1 refills | Status: DC

## 2019-08-15 NOTE — Progress Notes (Signed)
Patient: Kristen Hays Female    DOB: 1946-01-07   73 y.o.   MRN: [id number] Visit Date: 08/15/2019  Today's Provider: Mar Daring, PA-C   Chief Complaint  Patient presents with  . Follow-up   Subjective:     HPI  Patient here today for 4 week follow up Neuropathy. Patient was give Gabapentin. Reports that she still has pain on the right side chest/breast area. Some days it hurts and some days it doesn't. She is taking 100mg  of the gabapentin once daily at bedtime.    No Known Allergies   Current Outpatient Medications:  .  aspirin EC 81 MG tablet, Take by mouth., Disp: , Rfl:  .  atorvastatin (LIPITOR) 10 MG tablet, Take 1 tablet (10 mg total) by mouth daily., Disp: 90 tablet, Rfl: 3 .  Cholecalciferol (VITAMIN D) 2000 UNITS CAPS, Take 1 capsule by mouth daily., Disp: , Rfl:  .  Cyanocobalamin (VITAMIN B12 PO), Take 2,500 mg by mouth., Disp: , Rfl:  .  escitalopram (LEXAPRO) 10 MG tablet, START WITH 1/2 TABLET BY MOUTH NIGHTLY AT BEDTIME FOR 1 WEEK THEN INCREASE TO 1 TAB, Disp: 90 tablet, Rfl: 1 .  gabapentin (NEURONTIN) 100 MG capsule, Take 1 capsule (100 mg total) by mouth at bedtime., Disp: 30 capsule, Rfl: 1 .  isosorbide mononitrate (IMDUR) 30 MG 24 hr tablet, Take by mouth., Disp: , Rfl:  .  pantoprazole (PROTONIX) 40 MG tablet, Take by mouth., Disp: , Rfl:  .  PROAIR HFA 108 (90 Base) MCG/ACT inhaler, INHALE 2 PUFFS BY MOUTH EVERY 6 HOURS AS NEEDED FOR WHEEZE, Disp: , Rfl:  .  Vitamin D, Ergocalciferol, (DRISDOL) 1.25 MG (50000 UT) CAPS capsule, TAKE 1 CAPSULE (50,000 UNITS TOTAL) BY MOUTH EVERY 7 (SEVEN) DAYS., Disp: 12 capsule, Rfl: 0  Review of Systems  Constitutional: Negative.   Respiratory: Negative.   Cardiovascular: Negative.   Musculoskeletal: Negative.   Neurological: Negative.     Social History   Tobacco Use  . Smoking status: Never Smoker  . Smokeless tobacco: Never Used  Substance Use Topics  . Alcohol use: No      Objective:     BP 120/72 (BP Location: Left Arm, Patient Position: Sitting, Cuff Size: Large)   Pulse 72   Temp (!) 97.1 F (36.2 C) (Temporal)   Resp 16   Wt 140 lb 3.2 oz (63.6 kg)   BMI 24.07 kg/m  Vitals:   08/15/19 1345  BP: 120/72  Pulse: 72  Resp: 16  Temp: (!) 97.1 F (36.2 C)  TempSrc: Temporal  Weight: 140 lb 3.2 oz (63.6 kg)  Body mass index is 24.07 kg/m.   Physical Exam Vitals reviewed.  Constitutional:      General: She is not in acute distress.    Appearance: Normal appearance. She is well-developed. She is not ill-appearing or diaphoretic.  Cardiovascular:     Rate and Rhythm: Normal rate and regular rhythm.     Heart sounds: Normal heart sounds. No murmur. No friction rub. No gallop.   Pulmonary:     Effort: Pulmonary effort is normal. No respiratory distress.     Breath sounds: Normal breath sounds. No wheezing or rales.  Musculoskeletal:     Cervical back: Normal range of motion and neck supple.  Neurological:     Mental Status: She is alert.      No results found for any visits on 08/15/19.     Assessment & Plan  1. Neuropathy Suspected secondary to scar tissue and failing breast implant. Have discussed referral to plastic surgery for further evaluation. She wants to hold off at this time. Would like to try to increase gabapentin to see if that helps lessen symptoms (she feels it has helped some compared to previous). Will increase to 300mg  as below, taking 1/2 of 600mg  tab due to cost effectiveness. She will call next year if she desires the referral to plastic surgery.  - gabapentin (NEURONTIN) 600 MG tablet; Take 0.5 tablets (300 mg total) by mouth at bedtime.  Dispense: 45 tablet; Refill: [place], PA-C  [facility]

## 2019-09-12 ENCOUNTER — Other Ambulatory Visit: Payer: Self-pay | Admitting: Physician Assistant

## 2019-09-12 DIAGNOSIS — F321 Major depressive disorder, single episode, moderate: Secondary | ICD-10-CM

## 2019-10-07 ENCOUNTER — Ambulatory Visit: Payer: PPO | Attending: Internal Medicine

## 2019-10-07 DIAGNOSIS — Z20822 Contact with and (suspected) exposure to covid-19: Secondary | ICD-10-CM

## 2019-11-23 DIAGNOSIS — E785 Hyperlipidemia, unspecified: Secondary | ICD-10-CM | POA: Diagnosis not present

## 2019-11-23 DIAGNOSIS — F419 Anxiety disorder, unspecified: Secondary | ICD-10-CM | POA: Diagnosis not present

## 2019-11-23 DIAGNOSIS — K219 Gastro-esophageal reflux disease without esophagitis: Secondary | ICD-10-CM | POA: Diagnosis not present

## 2019-11-23 DIAGNOSIS — R079 Chest pain, unspecified: Secondary | ICD-10-CM | POA: Diagnosis not present

## 2019-11-23 DIAGNOSIS — E782 Mixed hyperlipidemia: Secondary | ICD-10-CM | POA: Diagnosis not present

## 2019-11-23 DIAGNOSIS — I208 Other forms of angina pectoris: Secondary | ICD-10-CM | POA: Diagnosis not present

## 2019-11-23 DIAGNOSIS — R0602 Shortness of breath: Secondary | ICD-10-CM | POA: Diagnosis not present

## 2019-12-08 DIAGNOSIS — K219 Gastro-esophageal reflux disease without esophagitis: Secondary | ICD-10-CM | POA: Diagnosis not present

## 2019-12-08 DIAGNOSIS — R1310 Dysphagia, unspecified: Secondary | ICD-10-CM | POA: Diagnosis not present

## 2019-12-20 DIAGNOSIS — K2270 Barrett's esophagus without dysplasia: Secondary | ICD-10-CM | POA: Diagnosis not present

## 2019-12-20 DIAGNOSIS — K581 Irritable bowel syndrome with constipation: Secondary | ICD-10-CM | POA: Diagnosis not present

## 2019-12-20 DIAGNOSIS — Z01818 Encounter for other preprocedural examination: Secondary | ICD-10-CM | POA: Diagnosis not present

## 2019-12-20 DIAGNOSIS — K2100 Gastro-esophageal reflux disease with esophagitis, without bleeding: Secondary | ICD-10-CM | POA: Diagnosis not present

## 2019-12-20 DIAGNOSIS — Z86010 Personal history of colonic polyps: Secondary | ICD-10-CM | POA: Diagnosis not present

## 2019-12-20 NOTE — Progress Notes (Signed)
Established patient visit    Patient: Kristen Hays   DOB: 1946/06/26   74 y.o. Female  MRN: [id number] Visit Date: 12/26/2019  Today's healthcare provider: Mar Daring, PA-C   Chief Complaint  Patient presents with  . Breast Pain    Has been going on for several months.   Subjective    HPI  Pt comes in today complaining of right breast pain.  Pt has had a mastectomy in 2010 and breast implant.    Patient Active Problem List   Diagnosis Date Noted  . History of cancer of right breast 01/08/2017  . S/P mastectomy, right 01/08/2017  . Depression, major, single episode, mild ([place]) 01/08/2017  . Dermatitis 02/26/2016  . Tarsal tunnel syndrome of right side 06/21/2015  . Anxiety 05/18/2015  . Globus hystericus 05/18/2015  . Blood in the urine 05/18/2015  . 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 HLD (hyperlipidemia) 05/18/2015  . LBP (low back pain) 05/18/2015  . 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 Cervical pain 05/18/2015  . Barrett esophagus 03/04/2015  . H/O adenomatous polyp of colon 03/04/2015  . Abdominal bloating 12/28/2014   Past Medical History:  Diagnosis Date  . Anxiety   . Barrett esophagus 2016  . Breast cancer ([place])    right breast c Mastectomy   . Dysphagia   . High cholesterol   . Hypothyroidism        Medications: Outpatient Medications Prior to Visit  Medication Sig  . aspirin EC 81 MG tablet Take by mouth.  Marland Kitchen atorvastatin (LIPITOR) 10 MG tablet Take 1 tablet (10 mg total) by mouth daily.  . Cholecalciferol (VITAMIN D) 2000 UNITS CAPS Take 1 capsule by mouth daily.  . Cyanocobalamin (VITAMIN B12 PO) Take 2,500 mg by mouth.  . escitalopram (LEXAPRO) 10 MG tablet START WITH 1/2 TABLET BY MOUTH NIGHTLY AT BEDTIME FOR 1 WEEK THEN INCREASE TO 1 TAB  . gabapentin (NEURONTIN) 600 MG tablet Take 0.5 tablets (300 mg total) by mouth at bedtime.  . isosorbide mononitrate (IMDUR) 30 MG 24 hr tablet Take by mouth.  . pantoprazole (PROTONIX) 40 MG  tablet Take by mouth.  Marland Kitchen PROAIR HFA 108 (90 Base) MCG/ACT inhaler INHALE 2 PUFFS BY MOUTH EVERY 6 HOURS AS NEEDED FOR WHEEZE  . Vitamin D, Ergocalciferol, (DRISDOL) 1.25 MG (50000 UT) CAPS capsule TAKE 1 CAPSULE (50,000 UNITS TOTAL) BY MOUTH EVERY 7 (SEVEN) DAYS.   No facility-administered medications prior to visit.    Review of Systems  Constitutional: Negative.   Cardiovascular: Negative.        Right breast pain  Skin: Negative.   Neurological: Negative for dizziness, light-headedness and headaches.    Last CBC Lab Results  Component Value Date   WBC 6.5 11/10/2018   HGB 15.2 11/10/2018   HCT 44.8 11/10/2018   MCV 88 11/10/2018   MCH 29.7 11/10/2018   RDW 13.4 11/10/2018   PLT 256 [id number]   Last metabolic panel Lab Results  Component Value Date   GLUCOSE 87 11/10/2018   NA 142 11/10/2018   K 4.8 11/10/2018   CL 102 11/10/2018   CO2 24 11/10/2018   BUN 18 11/10/2018   CREATININE 0.78 11/10/2018   GFRNONAA 77 11/10/2018   GFRAA 88 11/10/2018   CALCIUM 9.6 11/10/2018   PROT 6.5 11/10/2018   ALBUMIN 4.4 11/10/2018   LABGLOB 2.1 11/10/2018   AGRATIO 2.1 11/10/2018   BILITOT 0.6 11/10/2018   ALKPHOS 55 11/10/2018   AST 12 11/10/2018  ALT 15 11/10/2018       Objective    BP 133/79 (BP Location: Left Arm, Patient Position: Sitting, Cuff Size: Normal)   Pulse 66   Temp (!) 96.9 F (36.1 C) (Temporal)   Wt 142 lb (64.4 kg)   BMI 24.37 kg/m  BP Readings from Last 3 Encounters:  12/26/19 133/79  08/15/19 120/72  07/18/19 119/76   Wt Readings from Last 3 Encounters:  12/26/19 142 lb (64.4 kg)  08/15/19 140 lb 3.2 oz (63.6 kg)  07/18/19 139 lb (63 kg)      Physical Exam Vitals reviewed.  Constitutional:      General: She is not in acute distress.    Appearance: Normal appearance. She is well-developed and normal weight. She is not ill-appearing.  HENT:     Head: Normocephalic and atraumatic.  Pulmonary:     Effort: Pulmonary effort is normal.  No respiratory distress.  Chest:     Breasts: Breasts are asymmetrical.        Right: Tenderness present. No mass, nipple discharge or skin change.        Left: No swelling, bleeding, inverted nipple, mass, nipple discharge, skin change or tenderness.    Musculoskeletal:     Cervical back: Normal range of motion and neck supple.  Lymphadenopathy:     Upper Body:     Right upper body: No supraclavicular, axillary or pectoral adenopathy.     Left upper body: No supraclavicular, axillary or pectoral adenopathy.  Neurological:     Mental Status: She is alert.  Psychiatric:        Mood and Affect: Mood normal.        Behavior: Behavior normal.        Thought Content: Thought content normal.        Judgment: Judgment normal.       No results found for any visits on 12/26/19.   Assessment & Plan    1. S/P mastectomy, right Having chronic pain and issues with right breast. Will refer to plastic surgery for further evaluation and treatment considerations. Consult appreciated.  - Ambulatory referral to Plastic Surgery  2. S/P bilateral breast implants See above medical treatment plan. - Ambulatory referral to Plastic Surgery  3. Breast pain, right See above medical treatment plan. - Ambulatory referral to Plastic Surgery   No follow-ups on file.         Rubye Beach  [facility] [phone] (phone) [phone] (fax)  [place]

## 2019-12-26 ENCOUNTER — Ambulatory Visit (INDEPENDENT_AMBULATORY_CARE_PROVIDER_SITE_OTHER): Payer: PPO | Admitting: Physician Assistant

## 2019-12-26 ENCOUNTER — Encounter: Payer: Self-pay | Admitting: Physician Assistant

## 2019-12-26 ENCOUNTER — Other Ambulatory Visit: Payer: Self-pay

## 2019-12-26 VITALS — BP 133/79 | HR 66 | Temp 96.9°F | Wt 142.0 lb

## 2019-12-26 DIAGNOSIS — Z9882 Breast implant status: Secondary | ICD-10-CM

## 2019-12-26 DIAGNOSIS — Z9011 Acquired absence of right breast and nipple: Secondary | ICD-10-CM | POA: Diagnosis not present

## 2019-12-26 DIAGNOSIS — N644 Mastodynia: Secondary | ICD-10-CM | POA: Diagnosis not present

## 2019-12-26 NOTE — Patient Instructions (Signed)

## 2020-01-02 DIAGNOSIS — Z01818 Encounter for other preprocedural examination: Secondary | ICD-10-CM | POA: Diagnosis not present

## 2020-01-05 DIAGNOSIS — K635 Polyp of colon: Secondary | ICD-10-CM | POA: Diagnosis not present

## 2020-01-05 DIAGNOSIS — K648 Other hemorrhoids: Secondary | ICD-10-CM | POA: Diagnosis not present

## 2020-01-05 DIAGNOSIS — Z86010 Personal history of colonic polyps: Secondary | ICD-10-CM | POA: Diagnosis not present

## 2020-01-05 LAB — HM COLONOSCOPY

## 2020-01-13 ENCOUNTER — Other Ambulatory Visit: Payer: Self-pay

## 2020-01-13 ENCOUNTER — Encounter: Payer: Self-pay | Admitting: Plastic Surgery

## 2020-01-13 ENCOUNTER — Ambulatory Visit (INDEPENDENT_AMBULATORY_CARE_PROVIDER_SITE_OTHER): Payer: PPO | Admitting: Plastic Surgery

## 2020-01-13 VITALS — BP 128/66 | HR 71 | Temp 96.8°F | Ht 64.0 in | Wt 141.8 lb

## 2020-01-13 DIAGNOSIS — Z9011 Acquired absence of right breast and nipple: Secondary | ICD-10-CM

## 2020-01-13 DIAGNOSIS — N644 Mastodynia: Secondary | ICD-10-CM | POA: Diagnosis not present

## 2020-01-13 DIAGNOSIS — Z9889 Other specified postprocedural states: Secondary | ICD-10-CM | POA: Diagnosis not present

## 2020-01-13 NOTE — Progress Notes (Signed)
Patient ID: Kristen Hays, female    DOB: 06-21-1946, 74 y.o.   MRN: [id number]   Chief Complaint  Patient presents with  . Consult    Right breast implant pain  . Breast Problem    The patient is a 74 year old white female for evaluation of her breasts.  The patient underwent a right mastectomy in 2010 she had reconstruction with an implant placed.  She believes that the implant is saline.  She is not sure what size it is.  Her purposes of symmetry her left breast was lifted and an implant was placed.  She did not receive radiation.  She is 5 feet 4 inches tall and weighs 141 pounds.  Her main concern is that she has noticed right breast and lateral chest pain over the last few months.  She wants to make sure that there is nothing wrong.  She is doing well with her overall appearance.  She gets mammograms regularly on her left breast and there have not been any issues.  She has not had imaging on her right breast recently.  She has a history of Barrett's esophagitis and hyperlipidemia.  She is a little tender around the right breast.  The skin appears to be extremely thin I can almost see the outline of the implant.  Otherwise there is no scar concern.  There is no sign of hematoma or seroma the implant is soft I do not feel any lumps or bumps in the breast or the axilla.   Review of Systems  Constitutional: Negative.   HENT: Negative.   Eyes: Negative.   Respiratory: Negative.   Cardiovascular: Negative.  Negative for leg swelling.  Gastrointestinal: Negative.  Negative for abdominal pain.  Endocrine: Negative.   Genitourinary: Negative.   Musculoskeletal: Negative for back pain.  Skin: Negative for color change and wound.  Hematological: Negative.   Psychiatric/Behavioral: Negative.     Past Medical History:  Diagnosis Date  . Anxiety   . Barrett esophagus 2016  . Breast cancer ([place])    right breast c Mastectomy   . Dysphagia   . High cholesterol   . Hypothyroidism       Past Surgical History:  Procedure Laterality Date  . AUGMENTATION MAMMAPLASTY Bilateral   . BREAST SURGERY     mastectomy-right; breast augmentation  . CATARACT EXTRACTION W/PHACO Left 07/21/2016   Procedure: CATARACT EXTRACTION PHACO AND INTRAOCULAR LENS PLACEMENT (IOC);  Surgeon: Ronnell Freshwater, MD;  Location: [place];  Service: Ophthalmology;  Laterality: Left;  LEFT  . CESAREAN SECTION     1974/1979  . COLONOSCOPY N/A 01/17/2015   Procedure: COLONOSCOPY;  Surgeon: Manya Silvas, MD;  Location[facility] ENDOSCOPY;  Service: Endoscopy;  Laterality: N/A;  . ESOPHAGOGASTRODUODENOSCOPY N/A 01/17/2015   Procedure: ESOPHAGOGASTRODUODENOSCOPY (EGD);  Surgeon: Manya Silvas, MD;  Location: [facility] ENDOSCOPY;  Service: Endoscopy;  Laterality: N/A;  . ESOPHAGOGASTRODUODENOSCOPY (EGD) WITH PROPOFOL N/A 02/17/2018   Procedure: ESOPHAGOGASTRODUODENOSCOPY (EGD) WITH PROPOFOL;  Surgeon: Manya Silvas, MD;  Location: [facility] ENDOSCOPY;  Service: Endoscopy;  Laterality: N/A;  . EYE SURGERY Right 08/2014  . MASTECTOMY Right 2011  . SAVORY DILATION N/A 01/17/2015   Procedure: SAVORY DILATION;  Surgeon: Manya Silvas, MD;  Location: [facility] ENDOSCOPY;  Service: Endoscopy;  Laterality: N/A;  . TONSILLECTOMY  2011      Current Outpatient Medications:  .  aspirin EC 81 MG tablet, Take by mouth., Disp: , Rfl:  .  atorvastatin (LIPITOR) 10  MG tablet, Take 1 tablet (10 mg total) by mouth daily., Disp: 90 tablet, Rfl: 3 .  Cholecalciferol (VITAMIN D) 2000 UNITS CAPS, Take 1 capsule by mouth daily., Disp: , Rfl:  .  Cyanocobalamin (VITAMIN B12 PO), Take 2,500 mg by mouth., Disp: , Rfl:  .  escitalopram (LEXAPRO) 10 MG tablet, START WITH 1/2 TABLET BY MOUTH NIGHTLY AT BEDTIME FOR 1 WEEK THEN INCREASE TO 1 TAB, Disp: 90 tablet, Rfl: 1 .  gabapentin (NEURONTIN) 600 MG tablet, Take 0.5 tablets (300 mg total) by mouth at bedtime., Disp: 45 tablet, Rfl: 1 .  pantoprazole (PROTONIX) 40 MG tablet,  Take by mouth., Disp: , Rfl:    Objective:   Vitals:   01/13/20 1144  BP: 128/66  Pulse: 71  Temp: (!) 96.8 F (36 C)  SpO2: 98%    Physical Exam Vitals and nursing note reviewed.  Constitutional:      Appearance: Normal appearance.  HENT:     Head: Normocephalic and atraumatic.  Eyes:     Extraocular Movements: Extraocular movements intact.  Cardiovascular:     Rate and Rhythm: Normal rate.     Pulses: Normal pulses.  Pulmonary:     Effort: Pulmonary effort is normal. No respiratory distress.  Abdominal:     General: Abdomen is flat. There is no distension.  Neurological:     General: No focal deficit present.     Mental Status: She is alert and oriented to person, place, and time.  Psychiatric:        Mood and Affect: Mood normal.        Behavior: Behavior normal.        Thought Content: Thought content normal.     Assessment & Plan:  Acquired absence of right breast and nipple  Breast pain, right  History of reconstruction of right breast  We discussed the options for reconstruction with removal of the implant and placement of a another implant.  We also discussed removal of the implant and not replacing the implant and going with a brawl implant from [employer].  The patient received the information from [employer] and will at least talk with them.  In the meantime I would like to get her into physical therapy and see if that will help with range of motion and decreasing the pain.  I had like to see her back in 3 months.  [place], DO

## 2020-01-23 ENCOUNTER — Other Ambulatory Visit: Payer: Self-pay

## 2020-01-23 ENCOUNTER — Ambulatory Visit: Payer: PPO | Attending: Plastic Surgery | Admitting: Physical Therapy

## 2020-01-23 ENCOUNTER — Encounter: Payer: Self-pay | Admitting: Physical Therapy

## 2020-01-23 DIAGNOSIS — G8929 Other chronic pain: Secondary | ICD-10-CM | POA: Diagnosis not present

## 2020-01-23 DIAGNOSIS — M25611 Stiffness of right shoulder, not elsewhere classified: Secondary | ICD-10-CM | POA: Diagnosis not present

## 2020-01-23 DIAGNOSIS — M25511 Pain in right shoulder: Secondary | ICD-10-CM | POA: Diagnosis not present

## 2020-01-23 NOTE — Therapy (Signed)
[facility] [address], [place], [address] Phone: [phone]   Fax:  [phone]  Physical Therapy Evaluation  Patient Details  Name: Kristen Hays MRN: [id number] Date of Birth: 1946-03-11 No data recorded  Encounter Date: 01/23/2020  PT End of Session - 01/23/20 1117    Visit Number  1    Number of Visits  17    Date for PT Re-Evaluation  03/19/20    PT Start Time  1108    PT Stop Time  1200    PT Time Calculation (min)  52 min    Activity Tolerance  Patient tolerated treatment well    Behavior During Therapy  [facility] for tasks assessed/performed       Past Medical History:  Diagnosis Date  . Anxiety   . Barrett esophagus 2016  . Breast cancer ([place])    right breast c Mastectomy   . Dysphagia   . High cholesterol   . Hypothyroidism     Past Surgical History:  Procedure Laterality Date  . AUGMENTATION MAMMAPLASTY Bilateral   . BREAST SURGERY     mastectomy-right; breast augmentation  . CATARACT EXTRACTION W/PHACO Left 07/21/2016   Procedure: CATARACT EXTRACTION PHACO AND INTRAOCULAR LENS PLACEMENT (IOC);  Surgeon: Ronnell Freshwater, MD;  Location: [place];  Service: Ophthalmology;  Laterality: Left;  LEFT  . CESAREAN SECTION     1974/1979  . COLONOSCOPY N/A 01/17/2015   Procedure: COLONOSCOPY;  Surgeon: Manya Silvas, MD;  [facility] ENDOSCOPY;  Service: Endoscopy;  Laterality: N/A;  . ESOPHAGOGASTRODUODENOSCOPY N/A 01/17/2015   Procedure: ESOPHAGOGASTRODUODENOSCOPY (EGD);  Surgeon: Manya Silvas, MD;  Location[facility] ENDOSCOPY;  Service: Endoscopy;  Laterality: N/A;  . ESOPHAGOGASTRODUODENOSCOPY (EGD) WITH PROPOFOL N/A 02/17/2018   Procedure: ESOPHAGOGASTRODUODENOSCOPY (EGD) WITH PROPOFOL;  Surgeon: Manya Silvas, MD;  Location: [facility] ENDOSCOPY;  Service: Endoscopy;  Laterality: N/A;  . EYE SURGERY Right 08/2014  . MASTECTOMY Right 2011  . SAVORY DILATION N/A  01/17/2015   Procedure: SAVORY DILATION;  Surgeon: Manya Silvas, MD;  Location: [facility] ENDOSCOPY;  Service: Endoscopy;  Laterality: N/A;  . TONSILLECTOMY  2011    There were no vitals filed for this visit.   Subjective Assessment - 01/23/20 1108    Pertinent History  Pt is a 74 year old female with R shoulder restrictions; pertinent history  R masectomy, expander placement, and reconstruction from 2010. Patient reports that over the past 46months she has had R shoulder pain of insideous onset. Reports pain comes on with push/pull ADLs such as sweeping, vaccuming, mopping, and lifting heavier than a gallon of milk. Reports pain immediately with these tasks that will persist 24 hours or so. Worst pain over the past week 6/10 and best 0/10. Reports this pain is ant chest/shoulder, and that it is a "sharp ache". Denies numbness, tingling. Patient is retired, enjoys gardening in [employer], and spending time with her family. Patient is trying to decide between taking her implant out, replacing it, or trying to manage pain conservatively- her preference is conservative management. Pt denies N/V, B&B changes, unexplained weight fluctuation, saddle paresthesia, fever, night sweats, or unrelenting night pain at this time.    Limitations  Lifting;House hold activities    How long can you sit comfortably?  unlimited    How long can you stand comfortably?  unlimited    How long can you walk comfortably?  unlimited    Diagnostic tests  none  to date    Patient Stated Goals  decrease pain    Currently in Pain?  Yes    Pain Score  1     Pain Location  Shoulder    Pain Descriptors / Indicators  Sharp;Aching    Pain Type  Chronic pain    Pain Onset  More than a month ago    Pain Frequency  Intermittent    Aggravating Factors   vaccuming, sweeping, lifting    Pain Relieving Factors  tylenol    Effect of Pain on Daily Activities  unable to complete ADLs withotu pain             OBJECTIVE  MUSCULOSKELETAL: Tremor: Normal Bulk: Normal Tone: Normal  Cervical Screen AROM: WFL and painless with overpressure in all planes; except lateral bending R: 32d L 39d with pulling sensation at UT bilat Spurlings A (ipsilateral lateral flexion/axial compression):  Negative  Spurlings B (ipsilateral lateral flexion/contralateral rotation/axial compression): Negative  Repeated movement: No centralization or peripheralization with protraction or retraction Hoffman Sign (cervical cord compression): Negative bilat   Elbow Screen Elbow AROM:WNL bilat  Palpation TTP with grimace, trigger points at R pec minor and latissimus/teres minor with pt reporting concordant sign. TTP at R UT. Latent trigger points in R bicep, with pain to very deep palapation  Observation: Kyphotic posture with upper crossed syndrome. With overhead reach, visible scapular dyskinesis bilat R>L, improved with scapular assist  Strength R/L 5/5 Shoulder flexion (anterior deltoid/pec major/coracobrachialis, axillary n. (C5-6) and musculocutaneous n. (C5-7)) 5/5 Shoulder abduction (deltoid/supraspinatus, axillary/suprascapular n, C5) 4/5 Shoulder external rotation (infraspinatus/teres minor) 5/5 Shoulder internal rotation (subcapularis/lats/pec major) 5/5 Shoulder extension (posterior deltoid, lats, teres major, axillary/thoracodorsal n.) 5/5 Shoulder horizontal abduction 5/5 Elbow flexion (biceps brachii, brachialis, brachioradialis, musculoskeletal n, C5-6) 5/5 Elbow extension (triceps, radial n, C7) 5/5 Wrist Extension 5/5 Wrist Flexion 4+/5Latissimus 3+/4-Lower trap Y position 4-/4Scap retractors T position   AROM R/L 150/180 Shoulder flexion 180/180 Shoulder abduction (protraction with this) 90/90 Shoulder external rotation T10//CTJ Shoulder internal rotation CTJ/CTJ Shoulder extension *Indicates pain, overpressure performed unless otherwise indicated  PROM R/L 162*/180 Shoulder  flexion 180/180 Shoulder abduction 90/90 Shoulder external rotation 52/70 Shoulder internal rotation 60/60 Shoulder extension *Indicates pain, overpressure performed unless otherwise indicated  Accessory Motions/Glides Glenohumeral: All WNL with heavy gaurding with mobilization + overhead motion   Acromioclavicular:  Post and Ant WNL bilat  Sternoclavicular: All WNL bilat  Scapulothoracic: All WNL bilat  Muscle Length Testing Pectoralis Major: R abnormal Pectoralis Minor: R abnormal Biceps: Normal bilat with increased tension at R bicep muscle belly   NEUROLOGICAL:  Mental Status Patient is oriented to person, place and time.  Recent memory is intact.  Remote memory is intact.  Attention span and concentration are intact.  Expressive speech is intact.  Patient's fund of knowledge is within normal limits for educational level.  Sensation Grossly intact to light touch bilateral UE as determined by testing dermatomes C2-T2 Proprioception and hot/cold testing deferred on this date SPECIAL TESTS  Rotator Cuff  Drop Arm Test: Negative bilat Painful Arc (Pain from 60 to 120 degrees scaption): Negative bilat Infraspinatus Muscle Test: Negative bilat If all 3 tests positive, the probability of a full-thickness rotator cuff tear is 91%  Subacromial Impingement Hawkins-Kennedy: Negative bilat Neer (Block scapula, PROM flexion): Negative bilat Painful Arc (Pain from 60 to 120 degrees scaption): Negative bilat Empty Can: Negative bilat External Rotation Resistance: Negative bilat Horizontal Adduction: Negative bilat Scapular Assist: Positive R  Labral Tear Biceps  Load II (120 elevation, full ER, 90 elbow flexion, full supination, resisted elbow flexion): Negative bilat Crank (160 scaption, axial load with IR/ER): Negative bilat Active Compression Test:Negative bilat  Bicep Tendon Pathology Speed (shoulder flexion to 90, external rotation, full elbow extension, and  forearm supination with resistance: Negative bilat Yergason's (resisted shoulder ER and supination/biceps tendon pathology): Negative bilat  Shoulder Instability Sulcus Sign: Negative bilat Anterior Apprehension: Negative bilat  Ther-Ex PT led patient through the following therex with patient able to demonstrate accuracy folloiwng multi-modal cuing. Education on posture and proper length/tension relationship needed and how to accomplish this. Education on parameters frequency/rep/set/how and when to increase/decrease intensity.  Access Code: [email or web address] .Seated Scapular Retraction - 8 x daily - 7 x weekly - 10 reps - 2sec hold .Doorway Pec Stretch at 90 Degrees Abduction - 3 x daily - 7 x weekly - 30sec hold .Seated Upper Trapezius Stretch - 3 x daily - 7 x weekly - 30sec hold .Prone Chest Stretch on Chair - 3 x daily - 7 x weekly - 30sec hold    Objective measurements completed on examination: See above findings.                PT Education - 01/23/20 1116    Education Details  Patient was educated on diagnosis, anatomy and pathology involved, prognosis, role of PT, and was given an HEP, demonstrating exercise with proper form following verbal and tactile cues, and was given a paper hand out to continue exercise at home. Pt was educated on and agreed to plan of care.    Person(s) Educated  Patient    Methods  Explanation;Demonstration;Tactile cues;Verbal cues;Handout    Comprehension  Verbalized understanding;Returned demonstration;Verbal cues required;Tactile cues required       PT Short Term Goals - 01/23/20 1426      PT SHORT TERM GOAL #1   Title  Pt will be independent with HEP in order to improve strength and decrease pain in order to improve pain-free function at home and work.    Baseline  01/23/20 HEP given    Time  4    Period  Weeks    Status  New        PT Long Term Goals - 01/23/20 1423      PT LONG TERM GOAL #1   Title  Pt will decrease worst pain as  reported on NPRS by at least 3 points in order to demonstrate clinically significant reduction in pain    Baseline  01/23/20 7/10 with vacuuming/sweeping    Time  8    Period  Weeks    Status  New      PT LONG TERM GOAL #2   Title  Patient will increase FOTO score to 59 to demonstrate predicted increase in functional mobility to complete ADLs    Baseline  01/23/20 69    Time  8    Period  Weeks    Status  New      PT LONG TERM GOAL #3   Title  Patient will demonstrate full active R shoulder ROM in order to complete overhead and bathing ADLs    Baseline  01/23/20 R flex 150d IR T10    Time  8    Period  Weeks    Status  New      PT LONG TERM GOAL #4   Title  Pt will increase periscapular strength by at least 1/2 MMT grade in order to demonstrate improvement in strength and  function    Baseline  01/23/20 R/L latissimus 4+/5; Y 3+/4- T 4-/4    Time  8    Period  Days    Status  New       Visit Diagnosis: Chronic right shoulder pain  Stiffness of right shoulder, not elsewhere classified     Problem List Patient Active Problem List   Diagnosis Date Noted  . Breast pain, right 01/13/2020  . History of reconstruction of right breast 01/13/2020  . History of cancer of right breast 01/08/2017  . Acquired absence of right breast and nipple 01/08/2017  . Depression, major, single episode, mild ([place]) 01/08/2017  . Dermatitis 02/26/2016  . Tarsal tunnel syndrome of right side 06/21/2015  . Anxiety 05/18/2015  . Globus hystericus 05/18/2015  . Blood in the urine 05/18/2015  . 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 HLD (hyperlipidemia) 05/18/2015  . LBP (low back pain) 05/18/2015  . 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 Cervical pain 05/18/2015  . Barrett esophagus 03/04/2015  . H/O adenomatous polyp of colon 03/04/2015  . Abdominal bloating 12/28/2014   Shelton Silvas PT, DPT Shelton Silvas 01/23/2020, 2:28 PM  [facility] [address] [place], [address] Phone: [phone]   Fax:  [phone]  Name: TANGO BAUSERMAN MRN: [id number] Date of Birth: 1946/03/19

## 2020-01-25 ENCOUNTER — Other Ambulatory Visit: Payer: Self-pay

## 2020-01-25 ENCOUNTER — Encounter: Payer: Self-pay | Admitting: Physical Therapy

## 2020-01-25 ENCOUNTER — Ambulatory Visit: Payer: PPO | Admitting: Physical Therapy

## 2020-01-25 DIAGNOSIS — M25611 Stiffness of right shoulder, not elsewhere classified: Secondary | ICD-10-CM

## 2020-01-25 DIAGNOSIS — M25511 Pain in right shoulder: Secondary | ICD-10-CM

## 2020-01-25 NOTE — Therapy (Signed)
[facility] [address] [place], [address] Phone: [phone]   Fax:  [phone]  Physical Therapy Treatment  Patient Details  Name: Kristen Hays MRN: [id number] Date of Birth: 1945-11-09 No data recorded  Encounter Date: 01/25/2020  PT End of Session - 01/25/20 1303    Visit Number  2    Number of Visits  17    Date for PT Re-Evaluation  03/19/20    PT Start Time  0100    PT Stop Time  0140    PT Time Calculation (min)  40 min    Activity Tolerance  Patient tolerated treatment well    Behavior During Therapy  [facility] for tasks assessed/performed       Past Medical History:  Diagnosis Date  . Anxiety   . Barrett esophagus 2016  . Breast cancer ([place])    right breast c Mastectomy   . Dysphagia   . High cholesterol   . Hypothyroidism     Past Surgical History:  Procedure Laterality Date  . AUGMENTATION MAMMAPLASTY Bilateral   . BREAST SURGERY     mastectomy-right; breast augmentation  . CATARACT EXTRACTION W/PHACO Left 07/21/2016   Procedure: CATARACT EXTRACTION PHACO AND INTRAOCULAR LENS PLACEMENT (IOC);  Surgeon: Ronnell Freshwater, MD;  Location: [place];  Service: Ophthalmology;  Laterality: Left;  LEFT  . CESAREAN SECTION     1974/1979  . COLONOSCOPY N/A 01/17/2015   Procedure: COLONOSCOPY;  Surgeon: Manya Silvas, MD;  Location: [facility] ENDOSCOPY;  Service: Endoscopy;  Laterality: N/A;  . ESOPHAGOGASTRODUODENOSCOPY N/A 01/17/2015   Procedure: ESOPHAGOGASTRODUODENOSCOPY (EGD);  Surgeon: Manya Silvas, MD;  Location: [facility] ENDOSCOPY;  Service: Endoscopy;  Laterality: N/A;  . ESOPHAGOGASTRODUODENOSCOPY (EGD) WITH PROPOFOL N/A 02/17/2018   Procedure: ESOPHAGOGASTRODUODENOSCOPY (EGD) WITH PROPOFOL;  Surgeon: Manya Silvas, MD;  Location: [facility] ENDOSCOPY;  Service: Endoscopy;  Laterality: N/A;  . EYE SURGERY Right 08/2014  . MASTECTOMY Right 2011  . SAVORY DILATION N/A  01/17/2015   Procedure: SAVORY DILATION;  Surgeon: Manya Silvas, MD;  Location: [facility] ENDOSCOPY;  Service: Endoscopy;  Laterality: N/A;  . TONSILLECTOMY  2011    There were no vitals filed for this visit.  Subjective Assessment - 01/25/20 1302    Subjective  Patient reports R shoulder pain 4/10. Compliance with HEP.    Pertinent History  Pt is a 74 year old female with R shoulder restrictions; pertinent history  R masectomy, expander placement, and reconstruction from 2010. Patient reports that over the past 35months she has had R shoulder pain of insideous onset. Reports pain comes on with push/pull ADLs such as sweeping, vaccuming, mopping, and lifting heavier than a gallon of milk. Reports pain immediately with these tasks that will persist 24 hours or so. Worst pain over the past week 6/10 and best 0/10. Reports this pain is ant chest/shoulder, and that it is a "sharp ache". Denies numbness, tingling. Patient is retired, enjoys gardening in [employer], and spending time with her family. Patient is trying to decide between taking her implant out, replacing it, or trying to manage pain conservatively- her preference is conservative management. Pt denies N/V, B&B changes, unexplained weight fluctuation, saddle paresthesia, fever, night sweats, or unrelenting night pain at this time.    Limitations  Lifting;House hold activities    How long can you sit comfortably?  unlimited    How long can you stand comfortably?  unlimited    How long  can you walk comfortably?  unlimited    Diagnostic tests  none to date    Patient Stated Goals  decrease pain    Pain Onset  More than a month ago       Ther-Ex - Pulleys AAROM flex x15 abd x15 with min cuing for posture with good carry over - Seated Scapular Retraction x10 2sec hold, good carry over from eval, min cuing to prevent shoulder hiking - Standing rows 10# x10; 15# 2x 10 with demo and cuing initially for technique with good carry over - Scaption 2#  DB 3x 10 with demo and heavy cuing for scapular motion with decent carry over following - Seated lat stretch 30sec hold good carry over of cuing to "drop chest" - Seated UT stretch x30sec with cuing needed for set up - Doorway pec stretch 30sec hold with good carry over   Manual STM with trigger point release to R bicep, UT, pec minor, latissimus/teres minor.  G3 post mob to GHJ 30sec bouts 4 bouts; with flex x6 bouts Following: Dry Needling: (1) 82mm .30 needles placed along the R UT to decrease increased muscular spasms and trigger points with the patient positioned in supine with pincer grasp. Patient was educated on risks and benefits of therapy and verbally consents to PT.                         PT Education - 01/25/20 1303    Education Details  Therex form/technique    Person(s) Educated  Patient    Methods  Explanation;Demonstration;Verbal cues    Comprehension  Verbalized understanding;Returned demonstration;Verbal cues required       PT Short Term Goals - 01/23/20 1426      PT SHORT TERM GOAL #1   Title  Pt will be independent with HEP in order to improve strength and decrease pain in order to improve pain-free function at home and work.    Baseline  01/23/20 HEP given    Time  4    Period  Weeks    Status  New        PT Long Term Goals - 01/23/20 1423      PT LONG TERM GOAL #1   Title  Pt will decrease worst pain as reported on NPRS by at least 3 points in order to demonstrate clinically significant reduction in pain    Baseline  01/23/20 7/10 with vacuuming/sweeping    Time  8    Period  Weeks    Status  New      PT LONG TERM GOAL #2   Title  Patient will increase FOTO score to 59 to demonstrate predicted increase in functional mobility to complete ADLs    Baseline  01/23/20 69    Time  8    Period  Weeks    Status  New      PT LONG TERM GOAL #3   Title  Patient will demonstrate full active R shoulder ROM in order to complete overhead and  bathing ADLs    Baseline  01/23/20 R flex 150d IR T10    Time  8    Period  Weeks    Status  New      PT LONG TERM GOAL #4   Title  Pt will increase periscapular strength by at least 1/2 MMT grade in order to demonstrate improvement in strength and function    Baseline  01/23/20 R/L latissimus 4+/5; Y 3+/4-  T 4-/4    Time  8    Period  Days    Status  New            Plan - 01/25/20 1330    Clinical Impression Statement  PT utilized manual techniques for decreased pain and muscle tension with good success, full PROM following. Following manual and AAROM patient is able to acheive full active overhead motion. Patient is motivated throughout session with good carry over of proper technique/scapulo-humeral rhythm with no increased pain. PT will continue progression as able.    Personal Factors and Comorbidities  Age;Fitness;Comorbidity 1;Comorbidity 2;Past/Current Experience;Time since onset of injury/illness/exacerbation    Comorbidities  HLD, anxiety, depression, chronic LBP    Examination-Activity Limitations  Lift;Carry;Reach Overhead    Stability/Clinical Decision Making  Evolving/Moderate complexity    Clinical Decision Making  Moderate    Rehab Potential  Good    PT Frequency  2x / week    PT Duration  8 weeks    PT Treatment/Interventions  ADLs/Self Care Home Management;Electrical Stimulation;Therapeutic activities;Patient/family education;Joint Manipulations;Spinal Manipulations;Dry needling;Manual techniques;Passive range of motion;Therapeutic exercise;Functional mobility training;Ultrasound;Cryotherapy;Traction;Moist Heat;Neuromuscular re-education;DME Instruction    PT Next Visit Plan  review HEP, periscapular activation    PT Home Exercise Plan  scap retractions, pec, UT , and lat stretch    Consulted and Agree with Plan of Care  Patient       Patient will benefit from skilled therapeutic intervention in order to improve the following deficits and impairments:  Increased  fascial restricitons, Pain, Improper body mechanics, Postural dysfunction, Impaired tone, Decreased mobility, Decreased endurance, Decreased range of motion, Decreased strength, Impaired UE functional use, Impaired flexibility  Visit Diagnosis: Chronic right shoulder pain  Stiffness of right shoulder, not elsewhere classified     Problem List Patient Active Problem List   Diagnosis Date Noted  . Breast pain, right 01/13/2020  . History of reconstruction of right breast 01/13/2020  . History of cancer of right breast 01/08/2017  . Acquired absence of right breast and nipple 01/08/2017  . Depression, major, single episode, mild ([place]) 01/08/2017  . Dermatitis 02/26/2016  . Tarsal tunnel syndrome of right side 06/21/2015  . Anxiety 05/18/2015  . Globus hystericus 05/18/2015  . Blood in the urine 05/18/2015  . 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 HLD (hyperlipidemia) 05/18/2015  . LBP (low back pain) 05/18/2015  . 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 Cervical pain 05/18/2015  . Barrett esophagus 03/04/2015  . H/O adenomatous polyp of colon 03/04/2015  . Abdominal bloating 12/28/2014   Shelton Silvas PT, DPT Shelton Silvas 01/25/2020, 1:43 PM  [facility] [address] [place], [address] Phone: [phone]   Fax:  [phone]  Name: KALEB DIMEGLIO MRN: [id number] Date of Birth: 16-May-1946

## 2020-02-01 ENCOUNTER — Other Ambulatory Visit: Payer: Self-pay

## 2020-02-01 ENCOUNTER — Ambulatory Visit: Payer: PPO | Attending: Plastic Surgery | Admitting: Physical Therapy

## 2020-02-01 ENCOUNTER — Encounter: Payer: Self-pay | Admitting: Physical Therapy

## 2020-02-01 DIAGNOSIS — M25511 Pain in right shoulder: Secondary | ICD-10-CM | POA: Insufficient documentation

## 2020-02-01 DIAGNOSIS — M25611 Stiffness of right shoulder, not elsewhere classified: Secondary | ICD-10-CM | POA: Diagnosis not present

## 2020-02-01 DIAGNOSIS — G8929 Other chronic pain: Secondary | ICD-10-CM | POA: Diagnosis not present

## 2020-02-01 NOTE — Therapy (Signed)
[facility] [address] [place], [address] Phone: [phone]   Fax:  [phone]  Physical Therapy Treatment  Patient Details  Name: Kristen Hays MRN: [id number] Date of Birth: [birth year] No data recorded  Encounter Date: 02/01/2020  PT End of Session - 02/01/20 0951    Visit Number  3    Number of Visits  17    Date for PT Re-Evaluation  03/19/20    PT Start Time  0947    PT Stop Time  1026    PT Time Calculation (min)  39 min    Activity Tolerance  Patient tolerated treatment well    Behavior During Therapy  [facility] for tasks assessed/performed       Past Medical History:  Diagnosis Date  . Anxiety   . Barrett esophagus 2016  . Breast cancer ([place])    right breast c Mastectomy   . Dysphagia   . High cholesterol   . Hypothyroidism     Past Surgical History:  Procedure Laterality Date  . AUGMENTATION MAMMAPLASTY Bilateral   . BREAST SURGERY     mastectomy-right; breast augmentation  . CATARACT EXTRACTION W/PHACO Left 07/21/2016   Procedure: CATARACT EXTRACTION PHACO AND INTRAOCULAR LENS PLACEMENT (IOC);  Surgeon: Ronnell Freshwater, MD;  Location: [place];  Service: Ophthalmology;  Laterality: Left;  LEFT  . CESAREAN SECTION     1974/1979  . COLONOSCOPY N/A 01/17/2015   Procedure: COLONOSCOPY;  Surgeon: Manya Silvas, MD;  Location: [facility] ENDOSCOPY;  Service: Endoscopy;  Laterality: N/A;  . ESOPHAGOGASTRODUODENOSCOPY N/A 01/17/2015   Procedure: ESOPHAGOGASTRODUODENOSCOPY (EGD);  Surgeon: Manya Silvas, MD;  Location: [facility] ENDOSCOPY;  Service: Endoscopy;  Laterality: N/A;  . ESOPHAGOGASTRODUODENOSCOPY (EGD) WITH PROPOFOL N/A 02/17/2018   Procedure: ESOPHAGOGASTRODUODENOSCOPY (EGD) WITH PROPOFOL;  Surgeon: Manya Silvas, MD;  Location: [facility] ENDOSCOPY;  Service: Endoscopy;  Laterality: N/A;  . EYE SURGERY Right 08/2014  . MASTECTOMY Right 2011  . SAVORY DILATION N/A  01/17/2015   Procedure: SAVORY DILATION;  Surgeon: Manya Silvas, MD;  Location: [facility] ENDOSCOPY;  Service: Endoscopy;  Laterality: N/A;  . TONSILLECTOMY  2011    There were no vitals filed for this visit.  Subjective Assessment - 02/01/20 0949    Subjective  Patient reports R shoulder pain has been better, but she did some housework yesterday, which has increased her pain to 6/10 today.    Pertinent History  Pt is a 74 year old female with R shoulder restrictions; pertinent history  R masectomy, expander placement, and reconstruction from 2010. Patient reports that over the past 75months she has had R shoulder pain of insideous onset. Reports pain comes on with push/pull ADLs such as sweeping, vaccuming, mopping, and lifting heavier than a gallon of milk. Reports pain immediately with these tasks that will persist 24 hours or so. Worst pain over the past week 6/10 and best 0/10. Reports this pain is ant chest/shoulder, and that it is a "sharp ache". Denies numbness, tingling. Patient is retired, enjoys gardening in [employer], and spending time with her family. Patient is trying to decide between taking her implant out, replacing it, or trying to manage pain conservatively- her preference is conservative management. Pt denies N/V, B&B changes, unexplained weight fluctuation, saddle paresthesia, fever, night sweats, or unrelenting night pain at this time.    Limitations  Lifting;House hold activities    How long can you sit comfortably?  unlimited  How long can you stand comfortably?  unlimited    How long can you walk comfortably?  unlimited    Diagnostic tests  none to date    Patient Stated Goals  decrease pain    Pain Onset  More than a month ago       Ther-Ex - Pulleys AAROM flex x15 abd x15 with min cuing for posture with good carry over - Wall slides 2x 12 with demo and max cuing for posture and technique with good carry over during second set  - Band pull aparts GTB x10; BluTB x10  with heavy cuing intially for technique with scapular retraction with good carry over - Scaption with BluTB pull apart 2x 10 with heavy cuing initially to prevent shoulder hiking past 90d with decent carry over - Lat pulldown 25# 3x 10 with heavy cuing initially for technique, good carry over    Manual STM with trigger point release to R bicep, UT, pec minor, latissimus/teres minor.  G3 post mob to GHJ 30sec bouts 4 bouts; with flex x6 bouts Following: Dry Needling: (2/2) 25mm .30 needles placed along the R UT and R latissimus/teres minor to decrease increased muscular spasms and trigger points with the patient positioned in supine with pincer grasp. Patient was educated on risks and benefits of therapy and verbally consents to PT.   ROM prior to TDN flex 150d; following flex 180d                        PT Education - 02/01/20 0951    Education Details  therex form/technique, TDN    Person(s) Educated  Patient    Methods  Explanation;Demonstration;Verbal cues    Comprehension  Verbalized understanding;Returned demonstration;Verbal cues required       PT Short Term Goals - 01/23/20 1426      PT SHORT TERM GOAL #1   Title  Pt will be independent with HEP in order to improve strength and decrease pain in order to improve pain-free function at home and work.    Baseline  01/23/20 HEP given    Time  4    Period  Weeks    Status  New        PT Long Term Goals - 01/23/20 1423      PT LONG TERM GOAL #1   Title  Pt will decrease worst pain as reported on NPRS by at least 3 points in order to demonstrate clinically significant reduction in pain    Baseline  01/23/20 7/10 with vacuuming/sweeping    Time  8    Period  Weeks    Status  New      PT LONG TERM GOAL #2   Title  Patient will increase FOTO score to 59 to demonstrate predicted increase in functional mobility to complete ADLs    Baseline  01/23/20 69    Time  8    Period  Weeks    Status  New      PT  LONG TERM GOAL #3   Title  Patient will demonstrate full active R shoulder ROM in order to complete overhead and bathing ADLs    Baseline  01/23/20 R flex 150d IR T10    Time  8    Period  Weeks    Status  New      PT LONG TERM GOAL #4   Title  Pt will increase periscapular strength by at least 1/2 MMT grade in order  to demonstrate improvement in strength and function    Baseline  01/23/20 R/L latissimus 4+/5; Y 3+/4- T 4-/4    Time  8    Period  Days    Status  New            Plan - 02/01/20 1023    Clinical Impression Statement  PT continued manual techniques with TDN to decrease muscle tension and increase joint motion with good success, full ROM acheived following (inc by 30d). PT continued therex progression for increased scapulo-humeral rhythm and periscapular strengthening, with patient able to comply with most cuing for successul technique, needed multimodal cuing to accomplish. PT will continue progression as able.    Personal Factors and Comorbidities  Age;Fitness;Comorbidity 1;Comorbidity 2;Past/Current Experience;Time since onset of injury/illness/exacerbation    Comorbidities  HLD, anxiety, depression, chronic LBP    Examination-Activity Limitations  Lift;Carry;Reach Overhead    Examination-Participation Restrictions  Cleaning;Meal Prep;Yard Work;Community Activity    Stability/Clinical Decision Making  Evolving/Moderate complexity    Clinical Decision Making  Moderate    Rehab Potential  Good    PT Frequency  2x / week    PT Duration  8 weeks    PT Treatment/Interventions  ADLs/Self Care Home Management;Electrical Stimulation;Therapeutic activities;Patient/family education;Joint Manipulations;Spinal Manipulations;Dry needling;Manual techniques;Passive range of motion;Therapeutic exercise;Functional mobility training;Ultrasound;Cryotherapy;Traction;Moist Heat;Neuromuscular re-education;DME Instruction    PT Next Visit Plan  review HEP, periscapular activation    PT Home  Exercise Plan  scap retractions, pec, UT , and lat stretch    Consulted and Agree with Plan of Care  Patient       Patient will benefit from skilled therapeutic intervention in order to improve the following deficits and impairments:  Increased fascial restricitons, Pain, Improper body mechanics, Postural dysfunction, Impaired tone, Decreased mobility, Decreased endurance, Decreased range of motion, Decreased strength, Impaired UE functional use, Impaired flexibility  Visit Diagnosis: Chronic right shoulder pain  Stiffness of right shoulder, not elsewhere classified     Problem List Patient Active Problem List   Diagnosis Date Noted  . Breast pain, right 01/13/2020  . History of reconstruction of right breast 01/13/2020  . History of cancer of right breast 01/08/2017  . Acquired absence of right breast and nipple 01/08/2017  . Depression, major, single episode, mild ([place]) 01/08/2017  . Dermatitis 02/26/2016  . Tarsal tunnel syndrome of right side 06/21/2015  . Anxiety 05/18/2015  . Globus hystericus 05/18/2015  . Blood in the urine 05/18/2015  . 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 HLD (hyperlipidemia) 05/18/2015  . LBP (low back pain) 05/18/2015  . 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 Cervical pain 05/18/2015  . Barrett esophagus 03/04/2015  . H/O adenomatous polyp of colon 03/04/2015  . Abdominal bloating 12/28/2014   Shelton Silvas PT, DPT Shelton Silvas 02/01/2020, 1:43 PM  [facility] [address] [place], [address] Phone: [phone]   Fax:  [phone]  Name: VANSHIKA CHUBB MRN: [id number] Date of Birth: [birth year]

## 2020-02-03 ENCOUNTER — Encounter: Payer: Self-pay | Admitting: Physical Therapy

## 2020-02-03 ENCOUNTER — Ambulatory Visit: Payer: PPO | Admitting: Physical Therapy

## 2020-02-03 ENCOUNTER — Other Ambulatory Visit: Payer: Self-pay

## 2020-02-03 DIAGNOSIS — G8929 Other chronic pain: Secondary | ICD-10-CM

## 2020-02-03 DIAGNOSIS — M25611 Stiffness of right shoulder, not elsewhere classified: Secondary | ICD-10-CM

## 2020-02-03 DIAGNOSIS — M25511 Pain in right shoulder: Secondary | ICD-10-CM | POA: Diagnosis not present

## 2020-02-03 NOTE — Therapy (Signed)
[facility] [address] [place], [address] Phone: [phone]   Fax:  [phone]  Physical Therapy Treatment  Patient Details  Name: Kristen Hays MRN: [id number] Date of Birth: [birth year] No data recorded  Encounter Date: 02/03/2020  PT End of Session - 02/03/20 0838    Visit Number  4    Number of Visits  17    Date for PT Re-Evaluation  03/19/20    PT Start Time  0818    PT Stop Time  0856    PT Time Calculation (min)  38 min    Activity Tolerance  Patient tolerated treatment well    Behavior During Therapy  [facility] for tasks assessed/performed       Past Medical History:  Diagnosis Date  . Anxiety   . Barrett esophagus 2016  . Breast cancer ([place])    right breast c Mastectomy   . Dysphagia   . High cholesterol   . Hypothyroidism     Past Surgical History:  Procedure Laterality Date  . AUGMENTATION MAMMAPLASTY Bilateral   . BREAST SURGERY     mastectomy-right; breast augmentation  . CATARACT EXTRACTION W/PHACO Left 07/21/2016   Procedure: CATARACT EXTRACTION PHACO AND INTRAOCULAR LENS PLACEMENT (IOC);  Surgeon: Ronnell Freshwater, MD;  Location: [place];  Service: Ophthalmology;  Laterality: Left;  LEFT  . CESAREAN SECTION     1974/1979  . COLONOSCOPY N/A 01/17/2015   Procedure: COLONOSCOPY;  Surgeon: Manya Silvas, MD;  Location: [facility] ENDOSCOPY;  Service: Endoscopy;  Laterality: N/A;  . ESOPHAGOGASTRODUODENOSCOPY N/A 01/17/2015   Procedure: ESOPHAGOGASTRODUODENOSCOPY (EGD);  Surgeon: Manya Silvas, MD;  Location: [facility] ENDOSCOPY;  Service: Endoscopy;  Laterality: N/A;  . ESOPHAGOGASTRODUODENOSCOPY (EGD) WITH PROPOFOL N/A 02/17/2018   Procedure: ESOPHAGOGASTRODUODENOSCOPY (EGD) WITH PROPOFOL;  Surgeon: Manya Silvas, MD;  Location: [facility] ENDOSCOPY;  Service: Endoscopy;  Laterality: N/A;  . EYE SURGERY Right 08/2014  . MASTECTOMY Right 2011  . SAVORY DILATION N/A  01/17/2015   Procedure: SAVORY DILATION;  Surgeon: Manya Silvas, MD;  Location: [facility] ENDOSCOPY;  Service: Endoscopy;  Laterality: N/A;  . TONSILLECTOMY  2011    There were no vitals filed for this visit.  Subjective Assessment - 02/03/20 0821    Subjective  Patient reports no pian this am, she had some yesterday for a short time. Compliance with HEP    Pertinent History  Pt is a 74 year old female with R shoulder restrictions; pertinent history  R masectomy, expander placement, and reconstruction from 2010. Patient reports that over the past 81months she has had R shoulder pain of insideous onset. Reports pain comes on with push/pull ADLs such as sweeping, vaccuming, mopping, and lifting heavier than a gallon of milk. Reports pain immediately with these tasks that will persist 24 hours or so. Worst pain over the past week 6/10 and best 0/10. Reports this pain is ant chest/shoulder, and that it is a "sharp ache". Denies numbness, tingling. Patient is retired, enjoys gardening in [employer], and spending time with her family. Patient is trying to decide between taking her implant out, replacing it, or trying to manage pain conservatively- her preference is conservative management. Pt denies N/V, B&B changes, unexplained weight fluctuation, saddle paresthesia, fever, night sweats, or unrelenting night pain at this time.    Limitations  Lifting;House hold activities    How long can you sit comfortably?  unlimited    How long can you stand  comfortably?  unlimited    How long can you walk comfortably?  unlimited    Diagnostic tests  none to date    Patient Stated Goals  decrease pain    Pain Onset  More than a month ago       Ther-Ex - Pulleys AAROM flex x15 abd x15 with min cuing for posture with good carry over -Y on wall x10; 1# DB x10; 2# DB x10 with cuing to prevent thoracic ext compensation - Scaption with BluTB pull apart 3x 10 with max cuing needed, and use of scapulae on wall for TC with  good carry over - Scapular punches in supine x15 with max cuing for proper technique with scapular motion only, decent carry over Lat stretch seated 65min hold   Manual STM withtrigger point releaseto R bicep, UT, pec minor, latissimus/teres minor.  G3 post mob to GHJ 30sec bouts 4 bouts; with flex x6 bouts Following:Dry Needling: (2/2)6mm .30needles placed along the R UT and R latissimus/teres minorto decrease increased muscular spasms and trigger points with the patient positioned in supine with pincer grasp. Patient was educated on risks and benefits of therapy and verbally consents to PT.  ROM prior to TDN flex 150d; following flex 180d                          PT Education - 02/03/20 0834    Education Details  therex form/technique, TDN    Person(s) Educated  Patient    Methods  Explanation;Demonstration;Verbal cues    Comprehension  Verbalized understanding;Returned demonstration;Verbal cues required       PT Short Term Goals - 01/23/20 1426      PT SHORT TERM GOAL #1   Title  Pt will be independent with HEP in order to improve strength and decrease pain in order to improve pain-free function at home and work.    Baseline  01/23/20 HEP given    Time  4    Period  Weeks    Status  New        PT Long Term Goals - 01/23/20 1423      PT LONG TERM GOAL #1   Title  Pt will decrease worst pain as reported on NPRS by at least 3 points in order to demonstrate clinically significant reduction in pain    Baseline  01/23/20 7/10 with vacuuming/sweeping    Time  8    Period  Weeks    Status  New      PT LONG TERM GOAL #2   Title  Patient will increase FOTO score to 59 to demonstrate predicted increase in functional mobility to complete ADLs    Baseline  01/23/20 69    Time  8    Period  Weeks    Status  New      PT LONG TERM GOAL #3   Title  Patient will demonstrate full active R shoulder ROM in order to complete overhead and bathing ADLs     Baseline  01/23/20 R flex 150d IR T10    Time  8    Period  Weeks    Status  New      PT LONG TERM GOAL #4   Title  Pt will increase periscapular strength by at least 1/2 MMT grade in order to demonstrate improvement in strength and function    Baseline  01/23/20 R/L latissimus 4+/5; Y 3+/4- T 4-/4    Time  8  Period  Days    Status  New            Plan - 02/03/20 0845    Clinical Impression Statement  PT continued to utilize manual with TDN to decrease muscle tension with >20d increase in overhead flexion. PT continued therex progression for overhead mobility with pt able to comply with all cuing for proper technique. PT will continue progression as able.    Personal Factors and Comorbidities  Age;Fitness;Comorbidity 1;Comorbidity 2;Past/Current Experience;Time since onset of injury/illness/exacerbation    Comorbidities  HLD, anxiety, depression, chronic LBP    Examination-Activity Limitations  Lift;Carry;Reach Overhead    Examination-Participation Restrictions  Cleaning;Meal Prep;Yard Work;Community Activity    Stability/Clinical Decision Making  Evolving/Moderate complexity    Clinical Decision Making  Moderate    Rehab Potential  Good    PT Frequency  2x / week    PT Duration  8 weeks    PT Treatment/Interventions  ADLs/Self Care Home Management;Electrical Stimulation;Therapeutic activities;Patient/family education;Joint Manipulations;Spinal Manipulations;Dry needling;Manual techniques;Passive range of motion;Therapeutic exercise;Functional mobility training;Ultrasound;Cryotherapy;Traction;Moist Heat;Neuromuscular re-education;DME Instruction    PT Next Visit Plan  review HEP, periscapular activation    PT Home Exercise Plan  scap retractions, pec, UT , and lat stretch    Consulted and Agree with Plan of Care  Patient       Patient will benefit from skilled therapeutic intervention in order to improve the following deficits and impairments:  Increased fascial restricitons,  Pain, Improper body mechanics, Postural dysfunction, Impaired tone, Decreased mobility, Decreased endurance, Decreased range of motion, Decreased strength, Impaired UE functional use, Impaired flexibility  Visit Diagnosis: Chronic right shoulder pain  Stiffness of right shoulder, not elsewhere classified     Problem List Patient Active Problem List   Diagnosis Date Noted  . Breast pain, right 01/13/2020  . History of reconstruction of right breast 01/13/2020  . History of cancer of right breast 01/08/2017  . Acquired absence of right breast and nipple 01/08/2017  . Depression, major, single episode, mild ([place]) 01/08/2017  . Dermatitis 02/26/2016  . Tarsal tunnel syndrome of right side 06/21/2015  . Anxiety 05/18/2015  . Globus hystericus 05/18/2015  . Blood in the urine 05/18/2015  . 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 HLD (hyperlipidemia) 05/18/2015  . LBP (low back pain) 05/18/2015  . 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 Cervical pain 05/18/2015  . Barrett esophagus 03/04/2015  . H/O adenomatous polyp of colon 03/04/2015  . Abdominal bloating 12/28/2014   Shelton Silvas PT, DPT Shelton Silvas 02/03/2020, 8:56 AM  [facility] [address] [place], [address] Phone: [phone]   Fax:  [phone]  Name: REAGANN DOLCE MRN: [id number] Date of Birth: [birth year]

## 2020-02-06 ENCOUNTER — Encounter: Payer: Self-pay | Admitting: Physician Assistant

## 2020-02-07 ENCOUNTER — Ambulatory Visit: Payer: PPO | Admitting: Physical Therapy

## 2020-02-07 ENCOUNTER — Other Ambulatory Visit: Payer: Self-pay

## 2020-02-07 ENCOUNTER — Encounter: Payer: Self-pay | Admitting: Physical Therapy

## 2020-02-07 DIAGNOSIS — M25611 Stiffness of right shoulder, not elsewhere classified: Secondary | ICD-10-CM

## 2020-02-07 DIAGNOSIS — G8929 Other chronic pain: Secondary | ICD-10-CM

## 2020-02-07 DIAGNOSIS — M25511 Pain in right shoulder: Secondary | ICD-10-CM | POA: Diagnosis not present

## 2020-02-07 NOTE — Therapy (Signed)
[place] [facility] [address] [place], [address] Phone: [phone]   Fax:  [phone]  Physical Therapy Treatment  Patient Details  Name: Kristen Hays MRN: [id number] Date of Birth: [birth year] No data recorded  Encounter Date: 02/07/2020  PT End of Session - 02/07/20 1320    Visit Number  5    Number of Visits  17    Date for PT Re-Evaluation  03/19/20    PT Start Time  1259    PT Stop Time  1339    PT Time Calculation (min)  40 min    Activity Tolerance  Patient tolerated treatment well    Behavior During Therapy  [facility] for tasks assessed/performed       Past Medical History:  Diagnosis Date  . Anxiety   . Barrett esophagus 2016  . Breast cancer ([place])    right breast c Mastectomy   . Dysphagia   . High cholesterol   . Hypothyroidism     Past Surgical History:  Procedure Laterality Date  . AUGMENTATION MAMMAPLASTY Bilateral   . BREAST SURGERY     mastectomy-right; breast augmentation  . CATARACT EXTRACTION W/PHACO Left 07/21/2016   Procedure: CATARACT EXTRACTION PHACO AND INTRAOCULAR LENS PLACEMENT (IOC);  Surgeon: Ronnell Freshwater, MD;  Location: [place];  Service: Ophthalmology;  Laterality: Left;  LEFT  . CESAREAN SECTION     1974/1979  . COLONOSCOPY N/A 01/17/2015   Procedure: COLONOSCOPY;  Surgeon: Manya Silvas, MD;  Location: [facility] ENDOSCOPY;  Service: Endoscopy;  Laterality: N/A;  . ESOPHAGOGASTRODUODENOSCOPY N/A 01/17/2015   Procedure: ESOPHAGOGASTRODUODENOSCOPY (EGD);  Surgeon: Manya Silvas, MD;  Location: [facility] ENDOSCOPY;  Service: Endoscopy;  Laterality: N/A;  . ESOPHAGOGASTRODUODENOSCOPY (EGD) WITH PROPOFOL N/A 02/17/2018   Procedure: ESOPHAGOGASTRODUODENOSCOPY (EGD) WITH PROPOFOL;  Surgeon: Manya Silvas, MD;  Location: [facility] ENDOSCOPY;  Service: Endoscopy;  Laterality: N/A;  . EYE SURGERY Right 08/2014  . MASTECTOMY Right 2011  . SAVORY DILATION N/A  01/17/2015   Procedure: SAVORY DILATION;  Surgeon: Manya Silvas, MD;  Location: [facility] ENDOSCOPY;  Service: Endoscopy;  Laterality: N/A;  . TONSILLECTOMY  2011    There were no vitals filed for this visit.  Subjective Assessment - 02/07/20 1300    Subjective  Patient reports she did well after TDN last time. Compliance with HEP with 3/10 pain this am.    Pertinent History  Pt is a 74 year old female with R shoulder restrictions; pertinent history  R masectomy, expander placement, and reconstruction from 2010. Patient reports that over the past 9months she has had R shoulder pain of insideous onset. Reports pain comes on with push/pull ADLs such as sweeping, vaccuming, mopping, and lifting heavier than a gallon of milk. Reports pain immediately with these tasks that will persist 24 hours or so. Worst pain over the past week 6/10 and best 0/10. Reports this pain is ant chest/shoulder, and that it is a "sharp ache". Denies numbness, tingling. Patient is retired, enjoys gardening in [employer], and spending time with her family. Patient is trying to decide between taking her implant out, replacing it, or trying to manage pain conservatively- her preference is conservative management. Pt denies N/V, B&B changes, unexplained weight fluctuation, saddle paresthesia, fever, night sweats, or unrelenting night pain at this time.    Limitations  Lifting;House hold activities    How long can you sit comfortably?  unlimited    How long can you stand  comfortably?  unlimited    How long can you walk comfortably?  unlimited    Diagnostic tests  none to date    Patient Stated Goals  decrease pain    Pain Onset  More than a month ago         Ther-Ex - Pulleys AAROM flex x15 abd x15 with min cuing for posture with good carry over - Lat pulldown 15# x10; 25# 2x 10 with cuing with heavier weight to maintain scapular depression -Y on wall 2# DB x10; 3# 2x 10 with cuing to prevent thoracic ext compensation -  Overhead farmers carry 4# 19ft x4 with min cuing for set up with good carry over following - ER at 90d with RTB 3x 10 with max cuing initially for proper technique of therex with good carry over following Lat stretch seated 5min hold  Manual STM withtrigger point releaseto R bicep, UT, pec minor, latissimus/teres minor.  G3 post mob to GHJ 30sec bouts 4 bouts; with flex x6 bouts                         PT Education - 02/07/20 1319    Education Details  therex form/technique    Person(s) Educated  Patient    Methods  Explanation;Demonstration;Verbal cues    Comprehension  Verbalized understanding;Returned demonstration;Verbal cues required       PT Short Term Goals - 01/23/20 1426      PT SHORT TERM GOAL #1   Title  Pt will be independent with HEP in order to improve strength and decrease pain in order to improve pain-free function at home and work.    Baseline  01/23/20 HEP given    Time  4    Period  Weeks    Status  New        PT Long Term Goals - 01/23/20 1423      PT LONG TERM GOAL #1   Title  Pt will decrease worst pain as reported on NPRS by at least 3 points in order to demonstrate clinically significant reduction in pain    Baseline  01/23/20 7/10 with vacuuming/sweeping    Time  8    Period  Weeks    Status  New      PT LONG TERM GOAL #2   Title  Patient will increase FOTO score to 59 to demonstrate predicted increase in functional mobility to complete ADLs    Baseline  01/23/20 69    Time  8    Period  Weeks    Status  New      PT LONG TERM GOAL #3   Title  Patient will demonstrate full active R shoulder ROM in order to complete overhead and bathing ADLs    Baseline  01/23/20 R flex 150d IR T10    Time  8    Period  Weeks    Status  New      PT LONG TERM GOAL #4   Title  Pt will increase periscapular strength by at least 1/2 MMT grade in order to demonstrate improvement in strength and function    Baseline  01/23/20 R/L latissimus  4+/5; Y 3+/4- T 4-/4    Time  8    Period  Days    Status  New            Plan - 02/07/20 1343    Clinical Impression Statement  PT continued manual techniques to decrease soft  tissue restrictions with success. PT continued therex progression for increased overhead mobility and strength with patient able to complete all with good motivation, no inreased pain, and good carry over of all cuing for technique. PT will continue progression as able.    Personal Factors and Comorbidities  Age;Fitness;Comorbidity 1;Comorbidity 2;Past/Current Experience;Time since onset of injury/illness/exacerbation    Comorbidities  HLD, anxiety, depression, chronic LBP    Examination-Activity Limitations  Lift;Carry;Reach Overhead    Examination-Participation Restrictions  Cleaning;Meal Prep;Yard Work;Community Activity    Stability/Clinical Decision Making  Evolving/Moderate complexity    Clinical Decision Making  Moderate    Rehab Potential  Good    PT Frequency  2x / week    PT Duration  8 weeks    PT Treatment/Interventions  ADLs/Self Care Home Management;Electrical Stimulation;Therapeutic activities;Patient/family education;Joint Manipulations;Spinal Manipulations;Dry needling;Manual techniques;Passive range of motion;Therapeutic exercise;Functional mobility training;Ultrasound;Cryotherapy;Traction;Moist Heat;Neuromuscular re-education;DME Instruction    PT Next Visit Plan  review HEP, periscapular activation    PT Home Exercise Plan  scap retractions, pec, UT , and lat stretch    Consulted and Agree with Plan of Care  Patient       Patient will benefit from skilled therapeutic intervention in order to improve the following deficits and impairments:  Increased fascial restricitons, Pain, Improper body mechanics, Postural dysfunction, Impaired tone, Decreased mobility, Decreased endurance, Decreased range of motion, Decreased strength, Impaired UE functional use, Impaired flexibility  Visit  Diagnosis: Chronic right shoulder pain  Stiffness of right shoulder, not elsewhere classified     Problem List Patient Active Problem List   Diagnosis Date Noted  . Breast pain, right 01/13/2020  . History of reconstruction of right breast 01/13/2020  . History of cancer of right breast 01/08/2017  . Acquired absence of right breast and nipple 01/08/2017  . Depression, major, single episode, mild ([place]) 01/08/2017  . Dermatitis 02/26/2016  . Tarsal tunnel syndrome of right side 06/21/2015  . Anxiety 05/18/2015  . Globus hystericus 05/18/2015  . Blood in the urine 05/18/2015  . 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 HLD (hyperlipidemia) 05/18/2015  . LBP (low back pain) 05/18/2015  . 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 Cervical pain 05/18/2015  . Barrett esophagus 03/04/2015  . H/O adenomatous polyp of colon 03/04/2015  . Abdominal bloating 12/28/2014   Durwin Reges DPT Durwin Reges 02/07/2020, 2:09 PM  [place] [facility] [address] [place], [address] Phone: [phone]   Fax:  [phone]  Name: YAILINE BALLARD MRN: [id number] Date of Birth: July 21, 1946

## 2020-02-09 ENCOUNTER — Other Ambulatory Visit: Payer: Self-pay | Admitting: Physician Assistant

## 2020-02-09 DIAGNOSIS — Z1231 Encounter for screening mammogram for malignant neoplasm of breast: Secondary | ICD-10-CM

## 2020-02-13 ENCOUNTER — Encounter: Payer: Self-pay | Admitting: Physical Therapy

## 2020-02-13 ENCOUNTER — Ambulatory Visit: Payer: PPO | Admitting: Physical Therapy

## 2020-02-13 ENCOUNTER — Other Ambulatory Visit: Payer: Self-pay

## 2020-02-13 ENCOUNTER — Encounter: Payer: PPO | Admitting: Physical Therapy

## 2020-02-13 DIAGNOSIS — M25611 Stiffness of right shoulder, not elsewhere classified: Secondary | ICD-10-CM

## 2020-02-13 DIAGNOSIS — M25511 Pain in right shoulder: Secondary | ICD-10-CM | POA: Diagnosis not present

## 2020-02-13 NOTE — Therapy (Signed)
[place] [facility] [address], [place], [address] Phone: [phone]   Fax:  [phone]  Physical Therapy Treatment  Patient Details  Name: Kristen Hays MRN: [id number] Date of Birth: [birth year] No data recorded  Encounter Date: 02/13/2020   PT End of Session - 02/13/20 0952    Visit Number 6    Number of Visits 17    Date for PT Re-Evaluation 03/19/20    PT Start Time 0950    PT Stop Time 1030    PT Time Calculation (min) 40 min    Activity Tolerance Patient tolerated treatment well    Behavior During Therapy [facility] for tasks assessed/performed           Past Medical History:  Diagnosis Date  . Anxiety   . Barrett esophagus 2016  . Breast cancer ([place])    right breast c Mastectomy   . Dysphagia   . High cholesterol   . Hypothyroidism     Past Surgical History:  Procedure Laterality Date  . AUGMENTATION MAMMAPLASTY Bilateral   . BREAST SURGERY     mastectomy-right; breast augmentation  . CATARACT EXTRACTION W/PHACO Left 07/21/2016   Procedure: CATARACT EXTRACTION PHACO AND INTRAOCULAR LENS PLACEMENT (IOC);  Surgeon: Ronnell Freshwater, MD;  Location: [place];  Service: Ophthalmology;  Laterality: Left;  LEFT  . CESAREAN SECTION     1974/1979  . COLONOSCOPY N/A 01/17/2015   Procedure: COLONOSCOPY;  Surgeon: Manya Silvas, MD;  Location: [facility] ENDOSCOPY;  Service: Endoscopy;  Laterality: N/A;  . ESOPHAGOGASTRODUODENOSCOPY N/A 01/17/2015   Procedure: ESOPHAGOGASTRODUODENOSCOPY (EGD);  Surgeon: Manya Silvas, MD;  Location: [facility] ENDOSCOPY;  Service: Endoscopy;  Laterality: N/A;  . ESOPHAGOGASTRODUODENOSCOPY (EGD) WITH PROPOFOL N/A 02/17/2018   Procedure: ESOPHAGOGASTRODUODENOSCOPY (EGD) WITH PROPOFOL;  Surgeon: Manya Silvas, MD;  Location: [facility] ENDOSCOPY;  Service: Endoscopy;  Laterality: N/A;  . EYE SURGERY Right 08/2014  . MASTECTOMY Right 2011  . SAVORY DILATION N/A  01/17/2015   Procedure: SAVORY DILATION;  Surgeon: Manya Silvas, MD;  Location: [facility] ENDOSCOPY;  Service: Endoscopy;  Laterality: N/A;  . TONSILLECTOMY  2011    There were no vitals filed for this visit.   Subjective Assessment - 02/13/20 0950    Subjective Patient reports compliance with HEP over the weekend, with no pain today.    Pertinent History Pt is a 74 year old female with R shoulder restrictions; pertinent history  R masectomy, expander placement, and reconstruction from 2010. Patient reports that over the past 50months she has had R shoulder pain of insideous onset. Reports pain comes on with push/pull ADLs such as sweeping, vaccuming, mopping, and lifting heavier than a gallon of milk. Reports pain immediately with these tasks that will persist 24 hours or so. Worst pain over the past week 6/10 and best 0/10. Reports this pain is ant chest/shoulder, and that it is a "sharp ache". Denies numbness, tingling. Patient is retired, enjoys gardening in [employer], and spending time with her family. Patient is trying to decide between taking her implant out, replacing it, or trying to manage pain conservatively- her preference is conservative management. Pt denies N/V, B&B changes, unexplained weight fluctuation, saddle paresthesia, fever, night sweats, or unrelenting night pain at this time.    Limitations Lifting;House hold activities    How long can you sit comfortably? unlimited    How long can you stand comfortably? unlimited    How long can you walk comfortably?  unlimited    Diagnostic tests none to date    Patient Stated Goals decrease pain    Pain Onset More than a month ago           Ther-Ex - Pulleys AAROM flex x15 abd x15 with min cuing for posture with good carry over - Lat pulldown 25# 3x 10 with cuing to maintain scapular depression - Prone Y RUE only (unable bilat) 3x 10  - Supine eccentric lat pull over 5# on PVC pipe x10; 10# 2x 10 with LEs on theraball to prevent  thoracic compensation with good carry over - ER at 90d with RTB 2x 10 with mod cuing for technique with good carry over - ER at 90d to overhead press standing RTB 2x 10  Lat stretch seated 59min hold  Manual STM withtrigger point releaseto R bicep, UT, pec minor, latissimus/teres minor.  G3 post mob to GHJ 30sec bouts 4 bouts; with flex x6 bouts                            PT Education - 02/13/20 0951    Education Details therex form/technique    Person(s) Educated Patient    Methods Explanation;Demonstration;Verbal cues    Comprehension Verbalized understanding;Returned demonstration;Verbal cues required            PT Short Term Goals - 01/23/20 1426      PT SHORT TERM GOAL #1   Title Pt will be independent with HEP in order to improve strength and decrease pain in order to improve pain-free function at home and work.    Baseline 01/23/20 HEP given    Time 4    Period Weeks    Status New             PT Long Term Goals - 01/23/20 1423      PT LONG TERM GOAL #1   Title Pt will decrease worst pain as reported on NPRS by at least 3 points in order to demonstrate clinically significant reduction in pain    Baseline 01/23/20 7/10 with vacuuming/sweeping    Time 8    Period Weeks    Status New      PT LONG TERM GOAL #2   Title Patient will increase FOTO score to 59 to demonstrate predicted increase in functional mobility to complete ADLs    Baseline 01/23/20 69    Time 8    Period Weeks    Status New      PT LONG TERM GOAL #3   Title Patient will demonstrate full active R shoulder ROM in order to complete overhead and bathing ADLs    Baseline 01/23/20 R flex 150d IR T10    Time 8    Period Weeks    Status New      PT LONG TERM GOAL #4   Title Pt will increase periscapular strength by at least 1/2 MMT grade in order to demonstrate improvement in strength and function    Baseline 01/23/20 R/L latissimus 4+/5; Y 3+/4- T 4-/4    Time 8    Period  Days    Status New                 Plan - 02/13/20 1030    Clinical Impression Statement PT continued to utilize manual techniques to decrease soft tissue restrictions with success. PT continued to led patient through therex progression for increased periscapular and overhead strengthening with good  carry over of all cuing provided, good motivation throughout session and no increased pain. PT will continue progression as able.    Personal Factors and Comorbidities Age;Fitness;Comorbidity 1;Comorbidity 2;Past/Current Experience;Time since onset of injury/illness/exacerbation    Comorbidities HLD, anxiety, depression, chronic LBP    Examination-Activity Limitations Lift;Carry;Reach Overhead    Examination-Participation Restrictions Cleaning;Meal Prep;Yard Work;Community Activity    Stability/Clinical Decision Making Evolving/Moderate complexity    Clinical Decision Making Moderate    Rehab Potential Good    PT Frequency 2x / week    PT Duration 8 weeks    PT Treatment/Interventions ADLs/Self Care Home Management;Electrical Stimulation;Therapeutic activities;Patient/family education;Joint Manipulations;Spinal Manipulations;Dry needling;Manual techniques;Passive range of motion;Therapeutic exercise;Functional mobility training;Ultrasound;Cryotherapy;Traction;Moist Heat;Neuromuscular re-education;DME Instruction    PT Next Visit Plan overhead mobility, periscapular activation    PT Home Exercise Plan scap retractions, pec, UT , and lat stretch    Consulted and Agree with Plan of Care Patient           Patient will benefit from skilled therapeutic intervention in order to improve the following deficits and impairments:  Increased fascial restricitons, Pain, Improper body mechanics, Postural dysfunction, Impaired tone, Decreased mobility, Decreased endurance, Decreased range of motion, Decreased strength, Impaired UE functional use, Impaired flexibility  Visit Diagnosis: Chronic right  shoulder pain  Stiffness of right shoulder, not elsewhere classified     Problem List Patient Active Problem List   Diagnosis Date Noted  . Breast pain, right 01/13/2020  . History of reconstruction of right breast 01/13/2020  . History of cancer of right breast 01/08/2017  . Acquired absence of right breast and nipple 01/08/2017  . Depression, major, single episode, mild ([place]) 01/08/2017  . Dermatitis 02/26/2016  . Tarsal tunnel syndrome of right side 06/21/2015  . Anxiety 05/18/2015  . Globus hystericus 05/18/2015  . Blood in the urine 05/18/2015  . 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 HLD (hyperlipidemia) 05/18/2015  . LBP (low back pain) 05/18/2015  . 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 Cervical pain 05/18/2015  . Barrett esophagus 03/04/2015  . H/O adenomatous polyp of colon 03/04/2015  . Abdominal bloating 12/28/2014   Durwin Reges DPT Durwin Reges 02/13/2020, 10:37 AM  [facility] [address], [place], [address] Phone: [phone]   Fax:  [phone]  Name: DEBBORA ANG MRN: [id number] Date of Birth: Sep 22, 1945

## 2020-02-14 DIAGNOSIS — K581 Irritable bowel syndrome with constipation: Secondary | ICD-10-CM | POA: Diagnosis not present

## 2020-02-14 DIAGNOSIS — K2270 Barrett's esophagus without dysplasia: Secondary | ICD-10-CM | POA: Diagnosis not present

## 2020-02-15 ENCOUNTER — Encounter: Payer: Self-pay | Admitting: Physical Therapy

## 2020-02-15 ENCOUNTER — Other Ambulatory Visit: Payer: Self-pay

## 2020-02-15 ENCOUNTER — Ambulatory Visit: Payer: PPO | Admitting: Physical Therapy

## 2020-02-15 DIAGNOSIS — M25611 Stiffness of right shoulder, not elsewhere classified: Secondary | ICD-10-CM

## 2020-02-15 DIAGNOSIS — M25511 Pain in right shoulder: Secondary | ICD-10-CM | POA: Diagnosis not present

## 2020-02-15 DIAGNOSIS — G8929 Other chronic pain: Secondary | ICD-10-CM

## 2020-02-15 NOTE — Therapy (Signed)
[place] [facility] [address], [place], [address] Phone: [phone]   Fax:  [phone]  Physical Therapy Treatment  Patient Details  Name: Kristen Hays MRN: [id number] Date of Birth: 1945/12/30 No data recorded  Encounter Date: 02/15/2020   PT End of Session - 02/15/20 1003    Visit Number 7    Number of Visits 17    Date for PT Re-Evaluation 03/19/20    PT Start Time 0945    PT Stop Time 1030    PT Time Calculation (min) 45 min    Activity Tolerance Patient tolerated treatment well    Behavior During Therapy [facility] for tasks assessed/performed           Past Medical History:  Diagnosis Date  . Anxiety   . Barrett esophagus 2016  . Breast cancer ([place])    right breast c Mastectomy   . Dysphagia   . High cholesterol   . Hypothyroidism     Past Surgical History:  Procedure Laterality Date  . AUGMENTATION MAMMAPLASTY Bilateral   . BREAST SURGERY     mastectomy-right; breast augmentation  . CATARACT EXTRACTION W/PHACO Left 07/21/2016   Procedure: CATARACT EXTRACTION PHACO AND INTRAOCULAR LENS PLACEMENT (IOC);  Surgeon: Ronnell Freshwater, MD;  Location: [place];  Service: Ophthalmology;  Laterality: Left;  LEFT  . CESAREAN SECTION     1974/1979  . COLONOSCOPY N/A 01/17/2015   Procedure: COLONOSCOPY;  Surgeon: Manya Silvas, MD;  Location: [facility] ENDOSCOPY;  Service: Endoscopy;  Laterality: N/A;  . ESOPHAGOGASTRODUODENOSCOPY N/A 01/17/2015   Procedure: ESOPHAGOGASTRODUODENOSCOPY (EGD);  Surgeon: Manya Silvas, MD;  Location: [facility] ENDOSCOPY;  Service: Endoscopy;  Laterality: N/A;  . ESOPHAGOGASTRODUODENOSCOPY (EGD) WITH PROPOFOL N/A 02/17/2018   Procedure: ESOPHAGOGASTRODUODENOSCOPY (EGD) WITH PROPOFOL;  Surgeon: Manya Silvas, MD;  Location[facility] ENDOSCOPY;  Service: Endoscopy;  Laterality: N/A;  . EYE SURGERY Right 08/2014  . MASTECTOMY Right 2011  . SAVORY DILATION N/A  01/17/2015   Procedure: SAVORY DILATION;  Surgeon: Manya Silvas, MD;  Location: [facility] ENDOSCOPY;  Service: Endoscopy;  Laterality: N/A;  . TONSILLECTOMY  2011    There were no vitals filed for this visit.   Subjective Assessment - 02/15/20 0949    Subjective Reports no pain today, good compliance with HEP, improving motion overall.    Pertinent History Pt is a 74 year old female with R shoulder restrictions; pertinent history  R masectomy, expander placement, and reconstruction from 2010. Patient reports that over the past 36months she has had R shoulder pain of insideous onset. Reports pain comes on with push/pull ADLs such as sweeping, vaccuming, mopping, and lifting heavier than a gallon of milk. Reports pain immediately with these tasks that will persist 24 hours or so. Worst pain over the past week 6/10 and best 0/10. Reports this pain is ant chest/shoulder, and that it is a "sharp ache". Denies numbness, tingling. Patient is retired, enjoys gardening in [employer], and spending time with her family. Patient is trying to decide between taking her implant out, replacing it, or trying to manage pain conservatively- her preference is conservative management. Pt denies N/V, B&B changes, unexplained weight fluctuation, saddle paresthesia, fever, night sweats, or unrelenting night pain at this time.    Limitations Lifting;House hold activities    How long can you sit comfortably? unlimited    How long can you stand comfortably? unlimited    Diagnostic tests none to date  Patient Stated Goals decrease pain    Pain Onset More than a month ago              Ther-Ex - Pulleys AAROM flex x15 abd x15 with min cuing for posture with good carry over - Lat pulldown 25# 3x 10 with cuing to maintain scapular depression - Prone Y RUE only 1# DB 3x 10 with min cuing for set up with good carry over - TRX pull up x10; in Y position 2x 5 with demo and max cuing for proper technique with decent carry  over, very difficult - Hooklying eccentric lat pull over 5# DB bilat 3x 10 to prevent thoracic ext compensation with good carry over -Overhead farmers carry 4x 57ft 5# DB with heavy cuing for set up, good carry over following  Manual STM withtrigger point releaseto R bicep, UT, pec minor, latissimus/teres minor.  G3 post mob to GHJ 30sec bouts 4 bouts; with flex x6 bouts                        PT Education - 02/15/20 0950    Education Details therex form/technique    Person(s) Educated Patient    Methods Explanation;Demonstration;Verbal cues    Comprehension Verbalized understanding;Returned demonstration;Verbal cues required            PT Short Term Goals - 01/23/20 1426      PT SHORT TERM GOAL #1   Title Pt will be independent with HEP in order to improve strength and decrease pain in order to improve pain-free function at home and work.    Baseline 01/23/20 HEP given    Time 4    Period Weeks    Status New             PT Long Term Goals - 01/23/20 1423      PT LONG TERM GOAL #1   Title Pt will decrease worst pain as reported on NPRS by at least 3 points in order to demonstrate clinically significant reduction in pain    Baseline 01/23/20 7/10 with vacuuming/sweeping    Time 8    Period Weeks    Status New      PT LONG TERM GOAL #2   Title Patient will increase FOTO score to 59 to demonstrate predicted increase in functional mobility to complete ADLs    Baseline 01/23/20 69    Time 8    Period Weeks    Status New      PT LONG TERM GOAL #3   Title Patient will demonstrate full active R shoulder ROM in order to complete overhead and bathing ADLs    Baseline 01/23/20 R flex 150d IR T10    Time 8    Period Weeks    Status New      PT LONG TERM GOAL #4   Title Pt will increase periscapular strength by at least 1/2 MMT grade in order to demonstrate improvement in strength and function    Baseline 01/23/20 R/L latissimus 4+/5; Y 3+/4- T 4-/4     Time 8    Period Days    Status New                 Plan - 02/15/20 1017    Clinical Impression Statement PT continued therex progression for increased overhead mobility and strength with good success. Patient is able to comply with all cuing for proper technique of therex, with good motivation throughout session, without  increased pain. Patient is improving AROM, with near 100% overhead mobility, though she does report tension at latissimus with final degrees of overhead.    Personal Factors and Comorbidities Age;Fitness;Comorbidity 1;Comorbidity 2;Past/Current Experience;Time since onset of injury/illness/exacerbation    Comorbidities HLD, anxiety, depression, chronic LBP    Examination-Activity Limitations Lift;Carry;Reach Overhead    Examination-Participation Restrictions Cleaning;Meal Prep;Yard Work;Community Activity    Stability/Clinical Decision Making Evolving/Moderate complexity    Clinical Decision Making Moderate    Rehab Potential Good    PT Frequency 2x / week    PT Duration 8 weeks    PT Treatment/Interventions ADLs/Self Care Home Management;Electrical Stimulation;Therapeutic activities;Patient/family education;Joint Manipulations;Spinal Manipulations;Dry needling;Manual techniques;Passive range of motion;Therapeutic exercise;Functional mobility training;Ultrasound;Cryotherapy;Traction;Moist Heat;Neuromuscular re-education;DME Instruction    PT Next Visit Plan overhead mobility, periscapular activation    PT Home Exercise Plan scap retractions, pec, UT , and lat stretch    Consulted and Agree with Plan of Care Patient           Patient will benefit from skilled therapeutic intervention in order to improve the following deficits and impairments:  Increased fascial restricitons, Pain, Improper body mechanics, Postural dysfunction, Impaired tone, Decreased mobility, Decreased endurance, Decreased range of motion, Decreased strength, Impaired UE functional use, Impaired  flexibility  Visit Diagnosis: Chronic right shoulder pain  Stiffness of right shoulder, not elsewhere classified     Problem List Patient Active Problem List   Diagnosis Date Noted  . Breast pain, right 01/13/2020  . History of reconstruction of right breast 01/13/2020  . History of cancer of right breast 01/08/2017  . Acquired absence of right breast and nipple 01/08/2017  . Depression, major, single episode, mild ([place]) 01/08/2017  . Dermatitis 02/26/2016  . Tarsal tunnel syndrome of right side 06/21/2015  . Anxiety 05/18/2015  . Globus hystericus 05/18/2015  . Blood in the urine 05/18/2015  . 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 HLD (hyperlipidemia) 05/18/2015  . LBP (low back pain) 05/18/2015  . 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 Cervical pain 05/18/2015  . Barrett esophagus 03/04/2015  . H/O adenomatous polyp of colon 03/04/2015  . Abdominal bloating 12/28/2014   Durwin Reges DPT  Durwin Reges 02/15/2020, 10:31 AM  [place] [facility] [address] [place], [address] Phone: [phone]   Fax:  [phone]  Name: Kristen Hays MRN: [id number] Date of Birth: Apr 12, 1946

## 2020-02-16 DIAGNOSIS — H04123 Dry eye syndrome of bilateral lacrimal glands: Secondary | ICD-10-CM | POA: Diagnosis not present

## 2020-02-20 ENCOUNTER — Encounter: Payer: Self-pay | Admitting: Physical Therapy

## 2020-02-20 ENCOUNTER — Other Ambulatory Visit: Payer: Self-pay

## 2020-02-20 ENCOUNTER — Ambulatory Visit: Payer: PPO | Admitting: Physical Therapy

## 2020-02-20 DIAGNOSIS — M25611 Stiffness of right shoulder, not elsewhere classified: Secondary | ICD-10-CM

## 2020-02-20 DIAGNOSIS — M25511 Pain in right shoulder: Secondary | ICD-10-CM | POA: Diagnosis not present

## 2020-02-20 DIAGNOSIS — G8929 Other chronic pain: Secondary | ICD-10-CM

## 2020-02-20 NOTE — Therapy (Signed)
[place] [facility] [address], [place], [address] Phone: [phone]   Fax:  [phone]  Physical Therapy Treatment  Patient Details  Name: Kristen Hays MRN: [id number] Date of Birth: 06-Nov-1945 No data recorded  Encounter Date: 02/20/2020   PT End of Session - 02/20/20 1123    Visit Number 8    Number of Visits 17    Date for PT Re-Evaluation 03/19/20    Authorization - Visit Number 8    Authorization - Number of Visits 10    PT Start Time 1115    PT Stop Time 1200    PT Time Calculation (min) 45 min    Activity Tolerance Patient tolerated treatment well    Behavior During Therapy [facility] for tasks assessed/performed           Past Medical History:  Diagnosis Date  . Anxiety   . Barrett esophagus 2016  . Breast cancer ([place])    right breast c Mastectomy   . Dysphagia   . High cholesterol   . Hypothyroidism     Past Surgical History:  Procedure Laterality Date  . AUGMENTATION MAMMAPLASTY Bilateral   . BREAST SURGERY     mastectomy-right; breast augmentation  . CATARACT EXTRACTION W/PHACO Left 07/21/2016   Procedure: CATARACT EXTRACTION PHACO AND INTRAOCULAR LENS PLACEMENT (IOC);  Surgeon: Ronnell Freshwater, MD;  Location: [place];  Service: Ophthalmology;  Laterality: Left;  LEFT  . CESAREAN SECTION     1974/1979  . COLONOSCOPY N/A 01/17/2015   Procedure: COLONOSCOPY;  Surgeon: Manya Silvas, MD;  Location: [facility] ENDOSCOPY;  Service: Endoscopy;  Laterality: N/A;  . ESOPHAGOGASTRODUODENOSCOPY N/A 01/17/2015   Procedure: ESOPHAGOGASTRODUODENOSCOPY (EGD);  Surgeon: Manya Silvas, MD;  Location: [facility] ENDOSCOPY;  Service: Endoscopy;  Laterality: N/A;  . ESOPHAGOGASTRODUODENOSCOPY (EGD) WITH PROPOFOL N/A 02/17/2018   Procedure: ESOPHAGOGASTRODUODENOSCOPY (EGD) WITH PROPOFOL;  Surgeon: Manya Silvas, MD;  Location: [facility] ENDOSCOPY;  Service: Endoscopy;  Laterality: N/A;  . EYE  SURGERY Right 08/2014  . MASTECTOMY Right 2011  . SAVORY DILATION N/A 01/17/2015   Procedure: SAVORY DILATION;  Surgeon: Manya Silvas, MD;  Location: [facility] ENDOSCOPY;  Service: Endoscopy;  Laterality: N/A;  . TONSILLECTOMY  2011    There were no vitals filed for this visit.   Subjective Assessment - 02/20/20 1121    Subjective Reports no pain over the weekend. Feels like her shoulder is 90% better overall. Good compliance with HEP.    Pertinent History Pt is a 74 year old female with R shoulder restrictions; pertinent history  R masectomy, expander placement, and reconstruction from 2010. Patient reports that over the past 76months she has had R shoulder pain of insideous onset. Reports pain comes on with push/pull ADLs such as sweeping, vaccuming, mopping, and lifting heavier than a gallon of milk. Reports pain immediately with these tasks that will persist 24 hours or so. Worst pain over the past week 6/10 and best 0/10. Reports this pain is ant chest/shoulder, and that it is a "sharp ache". Denies numbness, tingling. Patient is retired, enjoys gardening in [employer], and spending time with her family. Patient is trying to decide between taking her implant out, replacing it, or trying to manage pain conservatively- her preference is conservative management. Pt denies N/V, B&B changes, unexplained weight fluctuation, saddle paresthesia, fever, night sweats, or unrelenting night pain at this time.    Limitations Lifting;House hold activities    How long can  you sit comfortably? unlimited    How long can you stand comfortably? unlimited    How long can you walk comfortably? unlimited    Diagnostic tests none to date    Patient Stated Goals decrease pain    Pain Onset More than a month ago            Ther-Ex - Pulleys AAROM flex x15 abd x15 with min cuing for posture with good carry over - TRX pull up in Y position 2x 5 with demo and max cuing for proper technique with decent carry over,  very difficult - Hooklying eccentric lat pull over 6# DB bilat 3x 10 to prevent thoracic ext compensation with good carry over - Seated military press 5# 3x 8 with cuing for scapular retraction and to prevent forward migration of UEs  Manual STM withtrigger point releaseto R bicep, UT, pec minor, latissimus/teres minor.  G3 post mob to GHJ 30sec bouts 4 bouts; with flex x6 bouts                         PT Education - 02/20/20 1132    Education Details therex form/technique    Person(s) Educated Patient    Methods Explanation;Demonstration;Verbal cues    Comprehension Verbalized understanding;Returned demonstration;Verbal cues required            PT Short Term Goals - 01/23/20 1426      PT SHORT TERM GOAL #1   Title Pt will be independent with HEP in order to improve strength and decrease pain in order to improve pain-free function at home and work.    Baseline 01/23/20 HEP given    Time 4    Period Weeks    Status New             PT Long Term Goals - 01/23/20 1423      PT LONG TERM GOAL #1   Title Pt will decrease worst pain as reported on NPRS by at least 3 points in order to demonstrate clinically significant reduction in pain    Baseline 01/23/20 7/10 with vacuuming/sweeping    Time 8    Period Weeks    Status New      PT LONG TERM GOAL #2   Title Patient will increase FOTO score to 59 to demonstrate predicted increase in functional mobility to complete ADLs    Baseline 01/23/20 69    Time 8    Period Weeks    Status New      PT LONG TERM GOAL #3   Title Patient will demonstrate full active R shoulder ROM in order to complete overhead and bathing ADLs    Baseline 01/23/20 R flex 150d IR T10    Time 8    Period Weeks    Status New      PT LONG TERM GOAL #4   Title Pt will increase periscapular strength by at least 1/2 MMT grade in order to demonstrate improvement in strength and function    Baseline 01/23/20 R/L latissimus 4+/5; Y 3+/4- T  4-/4    Time 8    Period Days    Status New                 Plan - 02/20/20 1143    Clinical Impression Statement PT continued therex progression for increased overhead mobility and strength with continued success. PT continued progression for increased strength and scapulo-humeral rhythm with patient able to comply with  all cuing for proper technique of therex. Should progress continue, PT will assess d/c readiness next session.    Personal Factors and Comorbidities Age;Fitness;Comorbidity 1;Comorbidity 2;Past/Current Experience;Time since onset of injury/illness/exacerbation    Comorbidities HLD, anxiety, depression, chronic LBP    Examination-Activity Limitations Lift;Carry;Reach Overhead    Examination-Participation Restrictions Cleaning;Meal Prep;Yard Work;Community Activity    Stability/Clinical Decision Making Evolving/Moderate complexity    Clinical Decision Making Moderate    Rehab Potential Good    PT Frequency 2x / week    PT Duration 8 weeks    PT Treatment/Interventions ADLs/Self Care Home Management;Electrical Stimulation;Therapeutic activities;Patient/family education;Joint Manipulations;Spinal Manipulations;Dry needling;Manual techniques;Passive range of motion;Therapeutic exercise;Functional mobility training;Ultrasound;Cryotherapy;Traction;Moist Heat;Neuromuscular re-education;DME Instruction    PT Next Visit Plan overhead mobility, periscapular activation    PT Home Exercise Plan scap retractions, pec, UT , and lat stretch    Consulted and Agree with Plan of Care Patient           Patient will benefit from skilled therapeutic intervention in order to improve the following deficits and impairments:  Increased fascial restricitons, Pain, Improper body mechanics, Postural dysfunction, Impaired tone, Decreased mobility, Decreased endurance, Decreased range of motion, Decreased strength, Impaired UE functional use, Impaired flexibility  Visit Diagnosis: Chronic  right shoulder pain  Stiffness of right shoulder, not elsewhere classified     Problem List Patient Active Problem List   Diagnosis Date Noted  . Breast pain, right 01/13/2020  . History of reconstruction of right breast 01/13/2020  . History of cancer of right breast 01/08/2017  . Acquired absence of right breast and nipple 01/08/2017  . Depression, major, single episode, mild ([place]) 01/08/2017  . Dermatitis 02/26/2016  . Tarsal tunnel syndrome of right side 06/21/2015  . Anxiety 05/18/2015  . Globus hystericus 05/18/2015  . Blood in the urine 05/18/2015  . 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 HLD (hyperlipidemia) 05/18/2015  . LBP (low back pain) 05/18/2015  . 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 Cervical pain 05/18/2015  . Barrett esophagus 03/04/2015  . H/O adenomatous polyp of colon 03/04/2015  . Abdominal bloating 12/28/2014   Durwin Reges DPT Durwin Reges 02/20/2020, 11:53 AM  [place] [facility] [address] [place], [address] Phone: [phone]   Fax:  [phone]  Name: Kristen Hays MRN: [id number] Date of Birth: March 03, 1946

## 2020-02-22 ENCOUNTER — Ambulatory Visit: Payer: PPO | Admitting: Physical Therapy

## 2020-02-22 ENCOUNTER — Encounter: Payer: Self-pay | Admitting: Physical Therapy

## 2020-02-22 ENCOUNTER — Other Ambulatory Visit: Payer: Self-pay

## 2020-02-22 DIAGNOSIS — M25511 Pain in right shoulder: Secondary | ICD-10-CM

## 2020-02-22 DIAGNOSIS — M25611 Stiffness of right shoulder, not elsewhere classified: Secondary | ICD-10-CM

## 2020-02-22 NOTE — Therapy (Signed)
[facility] [address] [place], [address] Phone: [phone]   Fax:  [phone]  Physical Therapy Treatment/Discharge Summary Reporting Period: 01/23/20 - 02/22/20  Patient Details  Name: Kristen Hays MRN: [id number] Date of Birth: [birth year] No data recorded  Encounter Date: 02/22/2020    Past Medical History:  Diagnosis Date   Anxiety    Barrett esophagus 2016   Breast cancer ([place])    right breast c Mastectomy    Dysphagia    High cholesterol    Hypothyroidism     Past Surgical History:  Procedure Laterality Date   AUGMENTATION MAMMAPLASTY Bilateral    BREAST SURGERY     mastectomy-right; breast augmentation   CATARACT EXTRACTION W/PHACO Left 07/21/2016   Procedure: CATARACT EXTRACTION PHACO AND INTRAOCULAR LENS PLACEMENT ([place]);  Surgeon: Ronnell Freshwater, MD;  Location: [facility];  Service: Ophthalmology;  Laterality: Left;  LEFT   CESAREAN SECTION     1974/1979   COLONOSCOPY N/A 01/17/2015   Procedure: COLONOSCOPY;  Surgeon: Manya Silvas, MD;  Location: [facility] ENDOSCOPY;  Service: Endoscopy;  Laterality: N/A;   ESOPHAGOGASTRODUODENOSCOPY N/A 01/17/2015   Procedure: ESOPHAGOGASTRODUODENOSCOPY (EGD);  Surgeon: Manya Silvas, MD;  Location: [facility] ENDOSCOPY;  Service: Endoscopy;  Laterality: N/A;   ESOPHAGOGASTRODUODENOSCOPY (EGD) WITH PROPOFOL N/A 02/17/2018   Procedure: ESOPHAGOGASTRODUODENOSCOPY (EGD) WITH PROPOFOL;  Surgeon: Manya Silvas, MD;  Location: [facility] ENDOSCOPY;  Service: Endoscopy;  Laterality: N/A;   EYE SURGERY Right 08/2014   MASTECTOMY Right 2011   SAVORY DILATION N/A 01/17/2015   Procedure: SAVORY DILATION;  Surgeon: Manya Silvas, MD;  [facility] ENDOSCOPY;  Service: Endoscopy;  Laterality: N/A;   TONSILLECTOMY  2011    There were no vitals filed for this visit.   Subjective Assessment - 02/22/20 1118    Subjective Patient  reports no pain other than soreness post therex. Feels like she can d/c to home program    Pertinent History Pt is a 74 year old female with R shoulder restrictions; pertinent history  R masectomy, expander placement, and reconstruction from 2010. Patient reports that over the past 17month she has had R shoulder pain of insideous onset. Reports pain comes on with push/pull ADLs such as sweeping, vaccuming, mopping, and lifting heavier than a gallon of milk. Reports pain immediately with these tasks that will persist 24 hours or so. Worst pain over the past week 6/10 and best 0/10. Reports this pain is ant chest/shoulder, and that it is a "sharp ache". Denies numbness, tingling. Patient is retired, enjoys [place] [employer] and spending time with her family. Patient is trying to decide between taking her implant out, replacing it, or trying to manage pain conservatively- her preference is conservative management. Pt denies N/V, B&B changes, unexplained weight fluctuation, saddle paresthesia, fever, night sweats, or unrelenting night pain at this time.    Limitations Lifting;House hold activities    How long can you sit comfortably? unlimited    How long can you stand comfortably? unlimited    How long can you walk comfortably? unlimited    Diagnostic tests none to date    Patient Stated Goals decrease pain    Pain Onset More than a month ago              Ther-Ex - Pulleys AAROM flex x15 abd x15 with min cuing for posture with good carry over PT reviewed the following HEP with patient with patient able to  demonstrate a set of the following with min cuing for correction needed. PT educated patient on parameters of therex (how/when to inc/decrease intensity, frequency, rep/set range, stretch hold time, and purpose of therex) with verbalized understanding.   Access Code: [email or web address] Prone Scapular Retraction Y - 1 x daily - 2-3 x weekly - 3 sets - 10 reps Prone Scapular Retraction Arms at Side -  1 x daily - 2-3 x weekly - 3 sets - 10 reps Standing Shoulder Scaption with Resistance - 1 x daily - 2-3 x weekly - 3 sets - 10 reps Standing Row with Anchored Resistance - 1 x daily - 2-3 x weekly - 3 sets - 10 reps Standing High Row with Resistance - 1 x daily - 2-3 x weekly - 3 sets - 10 reps Shoulder External Rotation and Scapular Retraction with Resistance - 1 x daily - 2-3 x weekly - 3 sets - 10 reps                      PT Education - 02/22/20 1149    Education Details therex form/technique    Person(s) Educated Patient    Methods Explanation;Demonstration;Verbal cues;Handout    Comprehension Verbalized understanding;Returned demonstration;Verbal cues required            PT Short Term Goals - 02/22/20 1144      PT SHORT TERM GOAL #1   Title Pt will be independent with HEP in order to improve strength and decrease pain in order to improve pain-free function at home and work.    Baseline 02/22/20 Completing current HEP 01/23/20 HEP given    Time 4    Period Weeks    Status Achieved             PT Long Term Goals - 02/22/20 1119      PT LONG TERM GOAL #1   Title Pt will decrease worst pain as reported on NPRS by at least 3 points in order to demonstrate clinically significant reduction in pain    Baseline 02/22/20 No pain overall 01/23/20 7/10 with vacuuming/sweeping    Time 8    Period Weeks    Status Achieved      PT LONG TERM GOAL #2   Title Patient will increase FOTO score to 69 to demonstrate predicted increase in functional mobility to complete ADLs    Baseline 02/22/20 70 01/23/20 59    Time 8    Period Weeks    Status Achieved      PT LONG TERM GOAL #3   Title Patient will demonstrate full active R shoulder ROM in order to complete overhead and bathing ADLs    Baseline 02/22/20 R flex 180d IR T5 01/23/20 R flex 150d IR T10    Time 8    Period Weeks    Status Achieved      PT LONG TERM GOAL #4   Title Pt will increase periscapular strength  by at least 1/2 MMT grade in order to demonstrate improvement in strength and function    Baseline 02/22/20 R/L latissimus 5/5; Y 4+/4+ T 4+/4+ 01/23/20 R/L latissimus 4+/5; Y 3+/4- T 4-/4    Time 8    Period Days    Status Achieved                 Plan - 02/22/20 1149    Clinical Impression Statement PT re-assessed goals this session where patient has met all goals at this time. Pt given  HEP recommendations for continued maintenance of progress, which she is able to verbalize and demonstrate understanding of. PT given clinic contact info should any further questions or concerns arise.    Personal Factors and Comorbidities Age;Fitness;Comorbidity 1;Comorbidity 2;Past/Current Experience;Time since onset of injury/illness/exacerbation    Comorbidities HLD, anxiety, depression, chronic LBP    Examination-Activity Limitations Lift;Carry;Reach Overhead    Examination-Participation Restrictions Cleaning;Meal Prep;Yard Work;Community Activity    Stability/Clinical Decision Making Evolving/Moderate complexity    Clinical Decision Making Moderate    Rehab Potential Good    PT Frequency 2x / week    PT Duration 8 weeks    PT Treatment/Interventions ADLs/Self Care Home Management;Electrical Stimulation;Therapeutic activities;Patient/family education;Joint Manipulations;Spinal Manipulations;Dry needling;Manual techniques;Passive range of motion;Therapeutic exercise;Functional mobility training;Ultrasound;Cryotherapy;Traction;Moist Heat;Neuromuscular re-education;DME Instruction    PT Next Visit Plan overhead mobility, periscapular activation    PT Home Exercise Plan scap retractions, pec, UT , and lat stretch    Consulted and Agree with Plan of Care Patient           Patient will benefit from skilled therapeutic intervention in order to improve the following deficits and impairments:  Increased fascial restricitons, Pain, Improper body mechanics, Postural dysfunction, Impaired tone, Decreased  mobility, Decreased endurance, Decreased range of motion, Decreased strength, Impaired UE functional use, Impaired flexibility  Visit Diagnosis: Chronic right shoulder pain  Stiffness of right shoulder, not elsewhere classified     Problem List Patient Active Problem List   Diagnosis Date Noted   Breast pain, right 01/13/2020   History of reconstruction of right breast 01/13/2020   History of cancer of right breast 01/08/2017   Acquired absence of right breast and nipple 01/08/2017   Depression, major, single episode, mild ([place]) 01/08/2017   Dermatitis 02/26/2016   Tarsal tunnel syndrome of right side 06/21/2015   Anxiety 05/18/2015   Globus hystericus 05/18/2015   Blood in the urine 05/18/2015   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   HLD (hyperlipidemia) 05/18/2015   LBP (low back pain) 05/18/2015   Disease of pharynx 05/18/2015   Cervical pain 05/18/2015   Barrett esophagus 03/04/2015   H/O adenomatous polyp of colon 03/04/2015   Abdominal bloating 12/28/2014   Durwin Reges DPT Durwin Reges 02/22/2020, 1:10 PM  [facility] [address] [place], [address] Phone: [phone]   Fax:  [phone]  Name: YOSHI VICENCIO MRN: [id number] Date of Birth: [birth year]

## 2020-02-29 ENCOUNTER — Encounter: Payer: PPO | Admitting: Physical Therapy

## 2020-03-02 ENCOUNTER — Encounter: Payer: PPO | Admitting: Physical Therapy

## 2020-03-07 ENCOUNTER — Other Ambulatory Visit: Payer: Self-pay | Admitting: Physician Assistant

## 2020-03-07 ENCOUNTER — Encounter: Payer: PPO | Admitting: Physical Therapy

## 2020-03-07 DIAGNOSIS — E7800 Pure hypercholesterolemia, unspecified: Secondary | ICD-10-CM

## 2020-03-07 DIAGNOSIS — F321 Major depressive disorder, single episode, moderate: Secondary | ICD-10-CM

## 2020-03-07 NOTE — Telephone Encounter (Signed)
Requested medications are due for refill today? Yes  Requested medications are on active medication list?  Yes  Last Refill:   02/14/2019  # 90 with 3 refills  Future visit scheduled?  No   Notes to Clinic:  Medication failed RX refill protocol due to labs not within established time frames.  Last labs were performed on 02/11/2019.

## 2020-03-07 NOTE — Telephone Encounter (Signed)
Requested Prescriptions  Pending Prescriptions Disp Refills   atorvastatin (LIPITOR) 10 MG tablet [Pharmacy Med Name: ATORVASTATIN 10 MG TABLET] 90 tablet 3    Sig: TAKE 1 TABLET BY MOUTH EVERY DAY     Cardiovascular:  Antilipid - Statins Failed - 03/07/2020  1:24 AM      Failed - Total Cholesterol in normal range and within 360 days    Cholesterol, Total  Date Value Ref Range Status  02/11/2019 243 (H) 100 - 199 mg/dL Final         Failed - LDL in normal range and within 360 days    LDL Calculated  Date Value Ref Range Status  02/11/2019 170 (H) 0 - 99 mg/dL Final         Failed - HDL in normal range and within 360 days    HDL  Date Value Ref Range Status  02/11/2019 43 >39 mg/dL Final         Failed - Triglycerides in normal range and within 360 days    Triglycerides  Date Value Ref Range Status  02/11/2019 152 (H) 0 - 149 mg/dL Final         Passed - Patient is not pregnant      Passed - Valid encounter within last 12 months    Recent Outpatient Visits          2 months ago S/P mastectomy, right   [place], [place], [place]   6 months ago Neuropathy   [place], [place], [place]   7 months ago Neuropathy   [place], [place], [place]   1 year ago Annual physical exam   [facility] [place], [place]   1 year ago Abdominal bloating   [facility] [place] M, PA-C              escitalopram (LEXAPRO) 10 MG tablet [Pharmacy Med Name: ESCITALOPRAM 10 MG TABLET] 90 tablet 1    Sig: START WITH 1/2 TABLET BY MOUTH NIGHTLY AT BEDTIME FOR 1 WEEK THEN INCREASE TO 1 TAB     Psychiatry:  Antidepressants - SSRI Failed - 03/07/2020  1:24 AM      Failed - Completed PHQ-2 or PHQ-9 in the last 360 days.      Passed - Valid encounter within last 6 months    Recent Outpatient Visits          2 months ago S/P mastectomy, right   [place], [place]   6 months ago Neuropathy   [place], [place], [place]   7 months ago Neuropathy   [facility] [place], [place], [place]   1 year ago Annual physical exam   [place], [place]   1 year ago Abdominal bloating   [facility] [place], [place]             Patient needs PHQ-9 updated with next office visit.

## 2020-03-12 ENCOUNTER — Ambulatory Visit
Admission: RE | Admit: 2020-03-12 | Discharge: 2020-03-12 | Disposition: A | Discharge status: Home / Self Care | Payer: PPO | Source: Ambulatory Visit | Attending: Physician Assistant | Admitting: Physician Assistant

## 2020-03-12 ENCOUNTER — Other Ambulatory Visit: Payer: Self-pay | Admitting: Physician Assistant

## 2020-03-12 DIAGNOSIS — Z1231 Encounter for screening mammogram for malignant neoplasm of breast: Secondary | ICD-10-CM

## 2020-04-10 NOTE — Progress Notes (Signed)
Subjective:   Kristen Hays is a 74 y.o. female who presents for Medicare Annual (Subsequent) preventive examination.  I connected with Somaly Marteney today by telephone and verified that I am speaking with the correct person using two identifiers. Location patient: home Location provider: work Persons participating in the virtual visit: patient, provider.   I discussed the limitations, risks, security and privacy concerns of performing an evaluation and management service by telephone and the availability of in person appointments. I also discussed with the patient that there may be a patient responsible charge related to this service. The patient expressed understanding and verbally consented to this telephonic visit.    Interactive audio and video telecommunications were attempted between this provider and patient, however failed, due to patient having technical difficulties OR patient did not have access to video capability.  We continued and completed visit with audio only.  Review of Systems    N/A  Cardiac Risk Factors include: advanced age (>19men, >3 women);dyslipidemia     Objective:    There were no vitals filed for this visit. There is no height or weight on file to calculate BMI.  Advanced Directives 04/11/2020 01/23/2020 01/08/2017 07/21/2016 06/21/2015 05/15/2015  Does Patient Have a Medical Advance Directive? Yes Yes Yes Yes No No  Type of Paramedic of [place];Living will [place];Living will Living will Living will - -  Does patient want to make changes to medical advance directive? - Yes (Inpatient - patient defers changing a medical advance directive and declines information at this time) - No - Patient declined - -  Copy of [place] in Chart? No - copy requested No - copy requested - No - copy requested - -  Would patient like information on creating a medical advance directive? - No - Patient  declined - - - -    Current Medications (verified) Outpatient Encounter Medications as of 04/11/2020  Medication Sig  . aspirin 81 MG EC tablet Take 81 mg by mouth daily.  Marland Kitchen atorvastatin (LIPITOR) 10 MG tablet TAKE 1 TABLET BY MOUTH EVERY DAY  . Cholecalciferol (VITAMIN D) 2000 UNITS CAPS Take 1 capsule by mouth daily.  . Cyanocobalamin (VITAMIN B12 PO) Take 2,500 mg by mouth.  . gabapentin (NEURONTIN) 600 MG tablet Take 0.5 tablets (300 mg total) by mouth at bedtime.  Marland Kitchen linaclotide (LINZESS) 72 MCG capsule Take 72 mcg by mouth daily before breakfast.   . pantoprazole (PROTONIX) 40 MG tablet Take 40 mg by mouth 2 (two) times daily before a meal.   . escitalopram (LEXAPRO) 10 MG tablet START WITH 1/2 TABLET BY MOUTH NIGHTLY AT BEDTIME FOR 1 WEEK THEN INCREASE TO 1 TAB (Patient not taking: Reported on 04/11/2020)   No facility-administered encounter medications on file as of 04/11/2020.    Allergies (verified) Patient has no known allergies.   History: Past Medical History:  Diagnosis Date  . Anxiety   . Barrett esophagus 2016  . Breast cancer ([place])    right breast c Mastectomy   . Dysphagia   . High cholesterol   . Hypothyroidism    Past Surgical History:  Procedure Laterality Date  . AUGMENTATION MAMMAPLASTY Bilateral   . BREAST SURGERY     mastectomy-right; breast augmentation  . CATARACT EXTRACTION W/PHACO Left 07/21/2016   Procedure: CATARACT EXTRACTION PHACO AND INTRAOCULAR LENS PLACEMENT (IOC);  Surgeon: Ronnell Freshwater, MD;  Location: [place];  Service: Ophthalmology;  Laterality: Left;  LEFT  .  CESAREAN SECTION     1974/1979  . COLONOSCOPY N/A 01/17/2015   Procedure: COLONOSCOPY;  Surgeon: Manya Silvas, MD;  Location: [facility] ENDOSCOPY;  Service: Endoscopy;  Laterality: N/A;  . ESOPHAGOGASTRODUODENOSCOPY N/A 01/17/2015   Procedure: ESOPHAGOGASTRODUODENOSCOPY (EGD);  Surgeon: Manya Silvas, MD;  Location[facility] ENDOSCOPY;  Service: Endoscopy;   Laterality: N/A;  . ESOPHAGOGASTRODUODENOSCOPY (EGD) WITH PROPOFOL N/A 02/17/2018   Procedure: ESOPHAGOGASTRODUODENOSCOPY (EGD) WITH PROPOFOL;  Surgeon: Manya Silvas, MD;  Location[facility] ENDOSCOPY;  Service: Endoscopy;  Laterality: N/A;  . EYE SURGERY Right 08/2014  . MASTECTOMY Right 2011  . SAVORY DILATION N/A 01/17/2015   Procedure: SAVORY DILATION;  Surgeon: Manya Silvas, MD;  Location: [facility] ENDOSCOPY;  Service: Endoscopy;  Laterality: N/A;  . TONSILLECTOMY  2011   Family History  Problem Relation Age of Onset  . Diabetes Mother   . Stroke Mother   . Alzheimer's disease Mother   . Bone cancer Father   . Vascular Disease Father   . Cancer Sister        breast  . Breast cancer Sister   . Irritable bowel syndrome Sister   . Diabetes Sister   . Healthy Brother   . Healthy Sister   . Breast cancer Paternal Aunt   . Breast cancer Cousin    Social History   Socioeconomic History  . Marital status: Married    Spouse name: Not on file  . Number of children: 2  . Years of education: HS  . Highest education level: High school graduate  Occupational History  . Occupation: retired    Fish farm manager: [employer]  Tobacco Use  . Smoking status: Never Smoker  . Smokeless tobacco: Never Used  Vaping Use  . Vaping Use: Never used  Substance and Sexual Activity  . Alcohol use: No  . Drug use: No  . Sexual activity: Yes    Birth control/protection: Post-menopausal  Other Topics Concern  . Not on file  Social History Narrative  . Not on file   Social Determinants of Health   Financial Resource Strain: Low Risk   . Difficulty of Paying Living Expenses: Not hard at all  Food Insecurity: No Food Insecurity  . Worried About Charity fundraiser in the Last Year: Never true  . Ran Out of Food in the Last Year: Never true  Transportation Needs: No Transportation Needs  . Lack of Transportation (Medical): No  . Lack of Transportation (Non-Medical): No  Physical  Activity: Insufficiently Active  . Days of Exercise per Week: 6 days  . Minutes of Exercise per Session: 20 min  Stress: No Stress Concern Present  . Feeling of Stress : Not at all  Social Connections: Moderately Integrated  . Frequency of Communication with Friends and Family: More than three times a week  . Frequency of Social Gatherings with Friends and Family: Twice a week  . Attends Religious Services: More than 4 times per year  . Active Member of Clubs or Organizations: No  . Attends Archivist Meetings: Never  . Marital Status: Married    Tobacco Counseling Counseling given: Not Answered   Clinical Intake:  Pre-visit preparation completed: Yes  Pain : No/denies pain     Nutritional Risks: None Diabetes: No  How often do you need to have someone help you when you read instructions, pamphlets, or other written materials from your doctor or pharmacy?: 1 - Never  Diabetic? No  Interpreter Needed?: No  Information entered by ::  [place], LPN   Activities of Daily Living In your present state of health, do you have any difficulty performing the following activities: 04/11/2020  Hearing? N  Vision? N  Difficulty concentrating or making decisions? N  Walking or climbing stairs? N  Dressing or bathing? N  Doing errands, shopping? N  Preparing Food and eating ? N  Using the Toilet? N  In the past six months, have you accidently leaked urine? N  Do you have problems with loss of bowel control? N  Managing your Medications? N  Managing your Finances? N  Housekeeping or managing your Housekeeping? N  Some recent data might be hidden    Patient Care Team: Mar Daring, PA-C as PCP - General (Family Medicine) Birder Robson, MD as Referring Physician (Ophthalmology)  Indicate any recent Medical Services you may have received from other than [facility] providers in the past year (date may be approximate).     Assessment:   This is a routine  wellness examination for Adalena.  Hearing/Vision screen No exam data present  Dietary issues and exercise activities discussed: Current Exercise Habits: Home exercise routine, Type of exercise: walking, Time (Minutes): 20, Frequency (Times/Week): 6, Weekly Exercise (Minutes/Week): 120, Intensity: Mild, Exercise limited by: None identified  Goals    . DIET - INCREASE WATER INTAKE     Recommend to drink at least 6-8 8oz glasses of water per day.      Depression Screen PHQ 2/9 Scores 04/11/2020 02/11/2019 11/10/2018 01/12/2018 10/23/2017 01/08/2017 11/12/2016  PHQ - 2 Score 0 0 0 1 1 4 6   PHQ- 9 Score - 1 - 4 7 11 14     Fall Risk Fall Risk  04/11/2020 11/10/2018 01/12/2018 01/08/2017 11/12/2016  Falls in the past year? 0 1 Yes No No  Number falls in past yr: 0 1 2 or more - -  Injury with Fall? 0 0 No - -    Any stairs in or around the home? Yes  If so, are there any without handrails? No  Home free of loose throw rugs in walkways, pet beds, electrical cords, etc? Yes  Adequate lighting in your home to reduce risk of falls? Yes   ASSISTIVE DEVICES UTILIZED TO PREVENT FALLS:  Life alert? No  Use of a cane, walker or w/c? No  Grab bars in the bathroom? No  Shower chair or bench in shower? No  Elevated toilet seat or a handicapped toilet? No    Cognitive Function: Declined today.     6CIT Screen 02/11/2019  What Year? 0 points  What month? 0 points  What time? 0 points  Count back from 20 0 points  Months in reverse 0 points  Repeat phrase 2 points  Total Score 2    Immunizations Immunization History  Administered Date(s) Administered  . Influenza Split 06/15/2012  . Influenza, High Dose Seasonal PF 06/06/2015, 06/07/2018, 04/21/2019  . PFIZER SARS-COV-2 Vaccination 10/11/2019, 11/01/2019  . Pneumococcal Conjugate-13 12/09/2013  . Pneumococcal Polysaccharide-23 01/07/2016    TDAP status: Due, Education has been provided regarding the importance of this vaccine. Advised may  receive this vaccine at local pharmacy or Health Dept. Aware to provide a copy of the vaccination record if obtained from local pharmacy or Health Dept. Verbalized acceptance and understanding. Flu Vaccine status: Up to date Pneumococcal vaccine status: Up to date Covid-19 vaccine status: Completed vaccines  Qualifies for Shingles Vaccine? Yes   Zostavax completed No   Shingrix Completed?: No.    Education has  been provided regarding the importance of this vaccine. Patient has been advised to call insurance company to determine out of pocket expense if they have not yet received this vaccine. Advised may also receive vaccine at local pharmacy or Health Dept. Verbalized acceptance and understanding.  Screening Tests Health Maintenance  Topic Date Due  . URINE MICROALBUMIN  Never done  . INFLUENZA VACCINE  04/01/2020  . TETANUS/TDAP  04/11/2021 (Originally 05/03/1965)  . DEXA SCAN  01/22/2021  . MAMMOGRAM  03/12/2022  . COLONOSCOPY  01/04/2030  . COVID-19 Vaccine  Completed  . Hepatitis C Screening  Completed  . PNA vac Low Risk Adult  Completed    Health Maintenance  Health Maintenance Due  Topic Date Due  . URINE MICROALBUMIN  Never done  . INFLUENZA VACCINE  04/01/2020    Colorectal cancer screening: Completed 01/05/20. Repeat every 10 years Mammogram status: Completed 03/12/20. Repeat every year Bone Density status: Completed 01/23/16. Results reflect: Bone density results: OSTEOPENIA. Repeat every 5 years.  Lung Cancer Screening: (Low Dose CT Chest recommended if Age 4-80 years, 30 pack-year currently smoking OR have quit w/in 15years.) does not qualify.   Additional Screening:  Hepatitis C Screening: Up to date  Vision Screening: Recommended annual ophthalmology exams for early detection of glaucoma and other disorders of the eye. Is the patient up to date with their annual eye exam?  Yes  Who is the provider or what is the name of the office in which the patient attends  annual eye exams? Dr George Ina @ [facility] If pt is not established with a provider, would they like to be referred to a provider to establish care? No .   Dental Screening: Recommended annual dental exams for proper oral hygiene  Community Resource Referral / Chronic Care Management: CRR required this visit?  No   CCM required this visit?  No      Plan:     I have personally reviewed and noted the following in the patient's chart:   . Medical and social history . Use of alcohol, tobacco or illicit drugs  . Current medications and supplements . Functional ability and status . Nutritional status . Physical activity . Advanced directives . List of other physicians . Hospitalizations, surgeries, and ER visits in previous 12 months . Vitals . Screenings to include cognitive, depression, and falls . Referrals and appointments  In addition, I have reviewed and discussed with patient certain preventive protocols, quality metrics, and best practice recommendations. A written personalized care plan for preventive services as well as general preventive health recommendations were provided to patient.     Nilaya Bouie [place], [place]   [id number]/82/[id number]   Nurse Notes: Pt needs a urine check at next in office apt.

## 2020-04-11 ENCOUNTER — Ambulatory Visit (INDEPENDENT_AMBULATORY_CARE_PROVIDER_SITE_OTHER): Payer: PPO

## 2020-04-11 ENCOUNTER — Other Ambulatory Visit: Payer: Self-pay

## 2020-04-11 DIAGNOSIS — Z0000 Encounter for general adult medical examination without abnormal findings: Secondary | ICD-10-CM

## 2020-04-11 NOTE — Patient Instructions (Signed)
Kristen Hays , Thank you for taking time to come for your Medicare Wellness Visit. I appreciate your ongoing commitment to your health goals. Please review the following plan we discussed and let me know if I can assist you in the future.   Screening recommendations/referrals: Colonoscopy: Up to date, due 12/2029 Mammogram: Up to date, due 03/2021 Bone Density: Up to date, due 12/2020 Recommended yearly ophthalmology/optometry visit for glaucoma screening and checkup Recommended yearly dental visit for hygiene and checkup  Vaccinations: Influenza vaccine: Due fall 2021 Pneumococcal vaccine: Completed series Tdap vaccine: Currently due, declined at this time.  Shingles vaccine: Shingrix discussed. Please contact your pharmacy for coverage information.     Advanced directives: Please bring a copy of your POA (Power of Attorney) and/or Living Will to your next appointment.   Conditions/risks identified: Recommend to drink at least 6-8 8oz glasses of water per day.  Next appointment: 05/25/20 @ 8:00 AM with [place] 65 Years and Older, Female Preventive care refers to lifestyle choices and visits with your health care provider that can promote health and wellness. What does preventive care include?  A yearly physical exam. This is also called an annual well check.  Dental exams once or twice a year.  Routine eye exams. Ask your health care provider how often you should have your eyes checked.  Personal lifestyle choices, including:  Daily care of your teeth and gums.  Regular physical activity.  Eating a healthy diet.  Avoiding tobacco and drug use.  Limiting alcohol use.  Practicing safe sex.  Taking low-dose aspirin every day.  Taking vitamin and mineral supplements as recommended by your health care provider. What happens during an annual well check? The services and screenings done by your health care provider during your annual well check will  depend on your age, overall health, lifestyle risk factors, and family history of disease. Counseling  Your health care provider may ask you questions about your:  Alcohol use.  Tobacco use.  Drug use.  Emotional well-being.  Home and relationship well-being.  Sexual activity.  Eating habits.  History of falls.  Memory and ability to understand (cognition).  Work and work Statistician.  Reproductive health. Screening  You may have the following tests or measurements:  Height, weight, and BMI.  Blood pressure.  Lipid and cholesterol levels. These may be checked every 5 years, or more frequently if you are over 29 years old.  Skin check.  Lung cancer screening. You may have this screening every year starting at age 61 if you have a 30-pack-year history of smoking and currently smoke or have quit within the past 15 years.  Fecal occult blood test (FOBT) of the stool. You may have this test every year starting at age 13.  Flexible sigmoidoscopy or colonoscopy. You may have a sigmoidoscopy every 5 years or a colonoscopy every 10 years starting at age 43.  Hepatitis C blood test.  Hepatitis B blood test.  Sexually transmitted disease (STD) testing.  Diabetes screening. This is done by checking your blood sugar (glucose) after you have not eaten for a while (fasting). You may have this done every 1-3 years.  Bone density scan. This is done to screen for osteoporosis. You may have this done starting at age 67.  Mammogram. This may be done every 1-2 years. Talk to your health care provider about how often you should have regular mammograms. Talk with your health care provider about your test results, treatment  options, and if necessary, the need for more tests. Vaccines  Your health care provider may recommend certain vaccines, such as:  Influenza vaccine. This is recommended every year.  Tetanus, diphtheria, and acellular pertussis (Tdap, Td) vaccine. You may need a  Td booster every 10 years.  Zoster vaccine. You may need this after age 55.  Pneumococcal 13-valent conjugate (PCV13) vaccine. One dose is recommended after age 11.  Pneumococcal polysaccharide (PPSV23) vaccine. One dose is recommended after age 5. Talk to your health care provider about which screenings and vaccines you need and how often you need them. This information is not intended to replace advice given to you by your health care provider. Make sure you discuss any questions you have with your health care provider. Document Released: 09/14/2015 Document Revised: 05/07/2016 Document Reviewed: 06/19/2015 Elsevier Interactive Patient Education  2017 [place] Prevention in the Home Falls can cause injuries. They can happen to people of all ages. There are many things you can do to make your home safe and to help prevent falls. What can I do on the outside of my home?  Regularly fix the edges of walkways and driveways and fix any cracks.  Remove anything that might make you trip as you walk through a door, such as a raised step or threshold.  Trim any bushes or trees on the path to your home.  Use bright outdoor lighting.  Clear any walking paths of anything that might make someone trip, such as rocks or tools.  Regularly check to see if handrails are loose or broken. Make sure that both sides of any steps have handrails.  Any raised decks and porches should have guardrails on the edges.  Have any leaves, snow, or ice cleared regularly.  Use sand or salt on walking paths during winter.  Clean up any spills in your garage right away. This includes oil or grease spills. What can I do in the bathroom?  Use night lights.  Install grab bars by the toilet and in the tub and shower. Do not use towel bars as grab bars.  Use non-skid mats or decals in the tub or shower.  If you need to sit down in the shower, use a plastic, non-slip stool.  Keep the floor dry. Clean  up any water that spills on the floor as soon as it happens.  Remove soap buildup in the tub or shower regularly.  Attach bath mats securely with double-sided non-slip rug tape.  Do not have throw rugs and other things on the floor that can make you trip. What can I do in the bedroom?  Use night lights.  Make sure that you have a light by your bed that is easy to reach.  Do not use any sheets or blankets that are too big for your bed. They should not hang down onto the floor.  Have a firm chair that has side arms. You can use this for support while you get dressed.  Do not have throw rugs and other things on the floor that can make you trip. What can I do in the kitchen?  Clean up any spills right away.  Avoid walking on wet floors.  Keep items that you use a lot in easy-to-reach places.  If you need to reach something above you, use a strong step stool that has a grab bar.  Keep electrical cords out of the way.  Do not use floor polish or wax that makes floors slippery.  If you must use wax, use non-skid floor wax.  Do not have throw rugs and other things on the floor that can make you trip. What can I do with my stairs?  Do not leave any items on the stairs.  Make sure that there are handrails on both sides of the stairs and use them. Fix handrails that are broken or loose. Make sure that handrails are as long as the stairways.  Check any carpeting to make sure that it is firmly attached to the stairs. Fix any carpet that is loose or worn.  Avoid having throw rugs at the top or bottom of the stairs. If you do have throw rugs, attach them to the floor with carpet tape.  Make sure that you have a light switch at the top of the stairs and the bottom of the stairs. If you do not have them, ask someone to add them for you. What else can I do to help prevent falls?  Wear shoes that:  Do not have high heels.  Have rubber bottoms.  Are comfortable and fit you well.  Are  closed at the toe. Do not wear sandals.  If you use a stepladder:  Make sure that it is fully opened. Do not climb a closed stepladder.  Make sure that both sides of the stepladder are locked into place.  Ask someone to hold it for you, if possible.  Clearly mark and make sure that you can see:  Any grab bars or handrails.  First and last steps.  Where the edge of each step is.  Use tools that help you move around (mobility aids) if they are needed. These include:  Canes.  Walkers.  Scooters.  Crutches.  Turn on the lights when you go into a dark area. Replace any light bulbs as soon as they burn out.  Set up your furniture so you have a clear path. Avoid moving your furniture around.  If any of your floors are uneven, fix them.  If there are any pets around you, be aware of where they are.  Review your medicines with your doctor. Some medicines can make you feel dizzy. This can increase your chance of falling. Ask your doctor what other things that you can do to help prevent falls. This information is not intended to replace advice given to you by your health care provider. Make sure you discuss any questions you have with your health care provider. Document Released: 06/14/2009 Document Revised: 01/24/2016 Document Reviewed: 09/22/2014 Elsevier Interactive Patient Education  2017 [employer].

## 2020-04-17 ENCOUNTER — Ambulatory Visit: Payer: PPO | Admitting: Plastic Surgery

## 2020-05-18 DIAGNOSIS — H43813 Vitreous degeneration, bilateral: Secondary | ICD-10-CM | POA: Diagnosis not present

## 2020-05-23 NOTE — Progress Notes (Signed)
Complete physical exam   Patient: Kristen Hays   DOB: [birth year]   74 y.o. Female  MRN: [id number] Visit Date: 05/25/2020  Today's healthcare provider: Mar Daring, PA-C   Chief Complaint  Patient presents with  . Annual Exam   Subjective    Kristen Hays is a 74 y.o. female who presents today for a complete physical exam.  She reports consuming a general diet. Home exercise routine includes walks every day for 20-30 minutes. She generally feels well. She reports sleeping well. She does have additional problems to discuss today.   She has been having this headaches, but has been more stressed. Reports headache is almost daily and is located in the back of the head and neck. Reports pain as a pulling pressure. Occasionally will radiate to the top of the head. Does cause pain around ears. Denies any visual changes or neurological deficits.  HPI  Patient had AWV with [facility] 04/11/20 Mammogram:03/12/20-Normal mammogram. Repeat screening in one year.  Past Medical History:  Diagnosis Date  . Anxiety   . Barrett esophagus 2016  . Breast cancer ([place])    right breast c Mastectomy   . Dysphagia   . High cholesterol   . Hypothyroidism    Past Surgical History:  Procedure Laterality Date  . AUGMENTATION MAMMAPLASTY Bilateral   . BREAST SURGERY     mastectomy-right; breast augmentation  . CATARACT EXTRACTION W/PHACO Left 07/21/2016   Procedure: CATARACT EXTRACTION PHACO AND INTRAOCULAR LENS PLACEMENT (IOC);  Surgeon: Ronnell Freshwater, MD;  Location: [facility];  Service: Ophthalmology;  Laterality: Left;  LEFT  . CESAREAN SECTION     1974/1979  . COLONOSCOPY N/A 01/17/2015   Procedure: COLONOSCOPY;  Surgeon: Manya Silvas, MD;  Location: [facility] ENDOSCOPY;  Service: Endoscopy;  Laterality: N/A;  . ESOPHAGOGASTRODUODENOSCOPY N/A 01/17/2015   Procedure: ESOPHAGOGASTRODUODENOSCOPY (EGD);  Surgeon: Manya Silvas, MD;  Location: [facility] ENDOSCOPY;  Service:  Endoscopy;  Laterality: N/A;  . ESOPHAGOGASTRODUODENOSCOPY (EGD) WITH PROPOFOL N/A 02/17/2018   Procedure: ESOPHAGOGASTRODUODENOSCOPY (EGD) WITH PROPOFOL;  Surgeon: Manya Silvas, MD;  Location: [facility] ENDOSCOPY;  Service: Endoscopy;  Laterality: N/A;  . EYE SURGERY Right 08/2014  . MASTECTOMY Right 2011  . SAVORY DILATION N/A 01/17/2015   Procedure: SAVORY DILATION;  Surgeon: Manya Silvas, MD;  Location: [facility] ENDOSCOPY;  Service: Endoscopy;  Laterality: N/A;  . TONSILLECTOMY  2011   Social History   Socioeconomic History  . Marital status: Married    Spouse name: Not on file  . Number of children: 2  . Years of education: HS  . Highest education level: High school graduate  Occupational History  . Occupation: retired    Fish farm manager: [employer]  Tobacco Use  . Smoking status: Never Smoker  . Smokeless tobacco: Never Used  Vaping Use  . Vaping Use: Never used  Substance and Sexual Activity  . Alcohol use: No  . Drug use: No  . Sexual activity: Yes    Birth control/protection: Post-menopausal  Other Topics Concern  . Not on file  Social History Narrative  . Not on file   Social Determinants of Health   Financial Resource Strain: Low Risk   . Difficulty of Paying Living Expenses: Not hard at all  Food Insecurity: No Food Insecurity  . Worried About Charity fundraiser in the Last Year: Never true  . Ran Out of Food in the Last Year: Never true  Transportation Needs: No Transportation Needs  .  Lack of Transportation (Medical): No  . Lack of Transportation (Non-Medical): No  Physical Activity: Insufficiently Active  . Days of Exercise per Week: 6 days  . Minutes of Exercise per Session: 20 min  Stress: No Stress Concern Present  . Feeling of Stress : Not at all  Social Connections: Moderately Integrated  . Frequency of Communication with Friends and Family: More than three times a week  . Frequency of Social Gatherings with Friends and Family: Twice a  week  . Attends Religious Services: More than 4 times per year  . Active Member of Clubs or Organizations: No  . Attends Archivist Meetings: Never  . Marital Status: Married  Human resources officer Violence: Not At Risk  . Fear of Current or Ex-Partner: No  . Emotionally Abused: No  . Physically Abused: No  . Sexually Abused: No   Family Status  Relation Name Status  . Mother  Deceased  . Father  Deceased  . Sister  Alive  . Sister  Alive  . Brother  Alive  . Sister  Alive  . Ethlyn Daniels  (Not Specified)  . Cousin  (Not Specified)   Family History  Problem Relation Age of Onset  . Diabetes Mother   . Stroke Mother   . Alzheimer's disease Mother   . Bone cancer Father   . Vascular Disease Father   . Cancer Sister        breast  . Breast cancer Sister   . Irritable bowel syndrome Sister   . Diabetes Sister   . Healthy Brother   . Healthy Sister   . Breast cancer Paternal Aunt   . Breast cancer Cousin    No Known Allergies  Patient Care Team: Mar Daring, PA-C as PCP - General (Family Medicine) Birder Robson, MD as Referring Physician (Ophthalmology)   Medications: Outpatient Medications Prior to Visit  Medication Sig  . aspirin 81 MG EC tablet Take 81 mg by mouth daily.  . Cholecalciferol (VITAMIN D) 2000 UNITS CAPS Take 1 capsule by mouth daily.  . Cyanocobalamin (VITAMIN B12 PO) Take 2,500 mg by mouth.  . escitalopram (LEXAPRO) 10 MG tablet START WITH 1/2 TABLET BY MOUTH NIGHTLY AT BEDTIME FOR 1 WEEK THEN INCREASE TO 1 TAB  . linaclotide (LINZESS) 72 MCG capsule Take 72 mcg by mouth daily before breakfast.   . pantoprazole (PROTONIX) 40 MG tablet Take 40 mg by mouth 2 (two) times daily before a meal.   . atorvastatin (LIPITOR) 10 MG tablet TAKE 1 TABLET BY MOUTH EVERY DAY (Patient not taking: Reported on 05/25/2020)  . gabapentin (NEURONTIN) 600 MG tablet Take 0.5 tablets (300 mg total) by mouth at bedtime. (Patient not taking: Reported on  05/25/2020)   No facility-administered medications prior to visit.    Review of Systems  Constitutional: Negative.   HENT: Negative.  Negative for ear discharge.   Eyes: Negative.   Respiratory: Positive for cough.   Cardiovascular: Negative.   Gastrointestinal: Positive for abdominal distention.  Endocrine: Negative.   Genitourinary: Negative.   Musculoskeletal: Positive for neck stiffness.  Skin: Negative.   Allergic/Immunologic: Negative.   Neurological: Positive for headaches.  Hematological: Negative.   Psychiatric/Behavioral: Negative.     Last CBC Lab Results  Component Value Date   WBC 6.5 11/10/2018   HGB 15.2 11/10/2018   HCT 44.8 11/10/2018   MCV 88 11/10/2018   MCH 29.7 11/10/2018   RDW 13.4 11/10/2018   PLT 256 32/20/2542   Last metabolic  panel Lab Results  Component Value Date   GLUCOSE 87 11/10/2018   NA 142 11/10/2018   K 4.8 11/10/2018   CL 102 11/10/2018   CO2 24 11/10/2018   BUN 18 11/10/2018   CREATININE 0.78 11/10/2018   GFRNONAA 77 11/10/2018   GFRAA 88 11/10/2018   CALCIUM 9.6 11/10/2018   PROT 6.5 11/10/2018   ALBUMIN 4.4 11/10/2018   LABGLOB 2.1 11/10/2018   AGRATIO 2.1 11/10/2018   BILITOT 0.6 11/10/2018   ALKPHOS 55 11/10/2018   AST 12 11/10/2018   ALT 15 11/10/2018      Objective    BP 115/70 (BP Location: Left Arm, Patient Position: Sitting, Cuff Size: Large)   Pulse 62   Temp 97.6 F (36.4 C) (Oral)   Resp 16   Ht 5\' 4"  (1.626 m)   Wt 140 lb (63.5 kg)   SpO2 100%   BMI 24.03 kg/m  BP Readings from Last 3 Encounters:  05/25/20 115/70  01/13/20 128/66  12/26/19 133/79   Wt Readings from Last 3 Encounters:  05/25/20 140 lb (63.5 kg)  01/13/20 141 lb 12.8 oz (64.3 kg)  12/26/19 142 lb (64.4 kg)      Physical Exam Vitals reviewed.  Constitutional:      General: She is not in acute distress.    Appearance: Normal appearance. She is well-developed and normal weight. She is not ill-appearing or diaphoretic.   HENT:     Head: Normocephalic and atraumatic.     Right Ear: Tympanic membrane, ear canal and external ear normal.     Left Ear: Tympanic membrane, ear canal and external ear normal.  Eyes:     General: No scleral icterus.       Right eye: No discharge.        Left eye: No discharge.     Extraocular Movements: Extraocular movements intact.     Conjunctiva/sclera: Conjunctivae normal.     Pupils: Pupils are equal, round, and reactive to light.  Neck:     Thyroid: No thyromegaly.     Vascular: No carotid bruit or JVD.     Trachea: No tracheal deviation.  Cardiovascular:     Rate and Rhythm: Normal rate and regular rhythm.     Pulses: Normal pulses.     Heart sounds: Normal heart sounds. No murmur heard.  No friction rub. No gallop.   Pulmonary:     Effort: Pulmonary effort is normal. No respiratory distress.     Breath sounds: Normal breath sounds. No wheezing or rales.  Chest:     Chest wall: No tenderness.    Abdominal:     General: Abdomen is flat. Bowel sounds are normal. There is no distension.     Palpations: Abdomen is soft. There is no mass.     Tenderness: There is no abdominal tenderness. There is no guarding or rebound.  Musculoskeletal:        General: No tenderness. Normal range of motion.     Cervical back: Normal range of motion and neck supple.     Right lower leg: No edema.     Left lower leg: No edema.  Lymphadenopathy:     Cervical: No cervical adenopathy.  Skin:    General: Skin is warm and dry.     Capillary Refill: Capillary refill takes less than 2 seconds.     Findings: No rash.  Neurological:     General: No focal deficit present.     Mental Status: She is  alert and oriented to person, place, and time. Mental status is at baseline.  Psychiatric:        Mood and Affect: Mood normal.        Behavior: Behavior normal.        Thought Content: Thought content normal.        Judgment: Judgment normal.       Last depression screening scores PHQ  2/9 Scores 05/25/2020 04/11/2020 02/11/2019  PHQ - 2 Score 0 0 0  PHQ- 9 Score 2 - 1   Last fall risk screening Fall Risk  05/25/2020  Falls in the past year? 1  Number falls in past yr: 1  Comment 3 tripped  Injury with Fall? 0  Follow up Falls evaluation completed   Last Audit-C alcohol use screening Alcohol Use Disorder Test (AUDIT) 04/11/2020  1. How often do you have a drink containing alcohol? 0  2. How many drinks containing alcohol do you have on a typical day when you are drinking? 0  3. How often do you have six or more drinks on one occasion? 0  AUDIT-C Score 0  Alcohol Brief Interventions/Follow-up AUDIT Score <7 follow-up not indicated   A score of 3 or more in women, and 4 or more in men indicates increased risk for alcohol abuse, EXCEPT if all of the points are from question 1   No results found for any visits on 05/25/20.  Assessment & Plan    Routine Health Maintenance and Physical Exam  Exercise Activities and Dietary recommendations Goals    . DIET - INCREASE WATER INTAKE     Recommend to drink at least 6-8 8oz glasses of water per day.       Immunization History  Administered Date(s) Administered  . Influenza Split 06/15/2012  . Influenza, High Dose Seasonal PF 06/06/2015, 06/07/2018, 04/21/2019  . PFIZER SARS-COV-2 Vaccination 10/11/2019, 11/01/2019  . Pneumococcal Conjugate-13 12/09/2013  . Pneumococcal Polysaccharide-23 01/07/2016    Health Maintenance  Topic Date Due  . URINE MICROALBUMIN  Never done  . INFLUENZA VACCINE  04/01/2020  . TETANUS/TDAP  04/11/2021 (Originally 05/03/1965)  . DEXA SCAN  01/22/2021  . MAMMOGRAM  03/12/2022  . COLONOSCOPY  01/04/2030  . COVID-19 Vaccine  Completed  . Hepatitis C Screening  Completed  . PNA vac Low Risk Adult  Completed    Discussed health benefits of physical activity, and encouraged her to engage in regular exercise appropriate for her age and condition.  1. Annual physical exam Normal physical  exam today. Will check labs as below and f/u pending lab results. If labs are stable and WNL she will not need to have these rechecked for one year at her next annual physical exam. She is to call the office in the meantime if she has any acute issue, questions or concerns. - CBC with Differential/Platelet - Comprehensive metabolic panel  2. Hypercholesterolemia Patient had stopped atorvastatin 10mg . Advised to restart medication. Wants to await and see how labs come back from lifestyle modifications and then can decide.  - CBC with Differential/Platelet - Comprehensive metabolic panel - Hemoglobin A1c - Lipid panel  3. Hypothyroidism due to acquired atrophy of thyroid Stable. No medication has been needed. Will check labs as below and f/u pending results. - TSH  4. Major depressive disorder, single episode, moderate (HCC) Worsening. 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 Will increase lexapro from 10mg  to 15mg  as below. F/U in 2-3 months.  -  escitalopram (LEXAPRO) 10 MG tablet; Take 1.5 tablets (15 mg total) by mouth at bedtime.  Dispense: 135 tablet; Refill: 1  5. Tension headache From stress. Will give baclofen as below for muscle tension in the neck. Call if worsening.  - baclofen (LIORESAL) 10 MG tablet; Take 1 tablet (10 mg total) by mouth 3 (three) times daily as needed (headaches).  Dispense: 30 each; Refill: 0  6. Need for influenza vaccination Flu vaccine given today without complication. Patient sat upright for 15 minutes to check for adverse reaction before being released. - Flu Vaccine QUAD High Dose(Fluad)   No follow-ups on file.     Reynolds Bowl, PA-C, have reviewed all documentation for this visit. The documentation on 05/25/20 for the exam, diagnosis, procedures, and orders are all accurate and complete.   Rubye Beach  [facility] [phone] (phone) [phone]  (fax)  [place]

## 2020-05-25 ENCOUNTER — Ambulatory Visit (INDEPENDENT_AMBULATORY_CARE_PROVIDER_SITE_OTHER): Payer: PPO | Admitting: Physician Assistant

## 2020-05-25 ENCOUNTER — Encounter: Payer: Self-pay | Admitting: Physician Assistant

## 2020-05-25 ENCOUNTER — Other Ambulatory Visit: Payer: Self-pay

## 2020-05-25 VITALS — BP 115/70 | HR 62 | Temp 97.6°F | Resp 16 | Ht 64.0 in | Wt 140.0 lb

## 2020-05-25 DIAGNOSIS — Z0000 Encounter for general adult medical examination without abnormal findings: Secondary | ICD-10-CM

## 2020-05-25 DIAGNOSIS — G44209 Tension-type headache, unspecified, not intractable: Secondary | ICD-10-CM

## 2020-05-25 DIAGNOSIS — Z23 Encounter for immunization: Secondary | ICD-10-CM

## 2020-05-25 DIAGNOSIS — F321 Major depressive disorder, single episode, moderate: Secondary | ICD-10-CM

## 2020-05-25 DIAGNOSIS — E7800 Pure hypercholesterolemia, unspecified: Secondary | ICD-10-CM

## 2020-05-25 DIAGNOSIS — E034 Atrophy of thyroid (acquired): Secondary | ICD-10-CM

## 2020-05-25 MED ORDER — ESCITALOPRAM OXALATE 10 MG PO TABS: 15.0000 mg | ORAL_TABLET | Freq: Every day | ORAL | 1 refills | Status: DC

## 2020-05-25 MED ORDER — BACLOFEN 10 MG PO TABS: 10.0000 mg | ORAL_TABLET | Freq: Three times a day (TID) | ORAL | 0 refills | Status: DC | PRN

## 2020-05-25 NOTE — Patient Instructions (Signed)
Tension Headache, Adult A tension headache is a feeling of pain, pressure, or aching in the head that is often felt over the front and sides of the head. The pain can be dull, or it can feel tight (constricting). There are two types of tension headache:  Episodic tension headache. This is when the headaches happen fewer than 15 days a month.  Chronic tension headache. This is when the headaches happen more than 15 days a month during a 3-month period. A tension headache can last from 30 minutes to several days. It is the most common kind of headache. Tension headaches are not normally associated with nausea or vomiting, and they do not get worse with physical activity. What are the causes? The exact cause of this condition is not known. Tension headaches are often triggered by stress, anxiety, or depression. Other triggers include:  Alcohol.  Too much caffeine or caffeine withdrawal.  Respiratory infections, such as colds, flu, or sinus infections.  Dental problems or teeth clenching.  Tiredness (fatigue).  Holding your head and neck in the same position for a long period of time, such as while using a computer.  Smoking.  Arthritis of the neck. What are the signs or symptoms? Symptoms of this condition include:  A feeling of pressure or tightness around the head.  Dull, aching head pain.  Pain over the front and sides of the head.  Tenderness in the muscles of the head, neck, and shoulders. How is this diagnosed? This condition may be diagnosed based on your symptoms, your medical history, and a physical exam. If your symptoms are severe or unusual, you may have imaging tests, such as a CT scan or an MRI of your head. Your vision may also be checked. How is this treated? This condition may be treated with lifestyle changes and with medicines that help relieve symptoms. Follow these instructions at home: Managing pain  Take over-the-counter and prescription medicines only as  told by your health care provider.  When you have a headache, lie down in a dark, quiet room.  If directed, apply ice to the head and neck: ? Put ice in a plastic bag. ? Place a towel between your skin and the bag. ? Leave the ice on for 20 minutes, 2-3 times a day.  If directed, apply heat to the back of your neck as often as told by your health care provider. Use the heat source that your health care provider recommends, such as a moist heat pack or a heating pad. ? Place a towel between your skin and the heat source. ? Leave the heat on for 20-30 minutes. ? Remove the heat if your skin turns bright red. This is especially important if you are unable to feel pain, heat, or cold. You may have a greater risk of getting burned. Eating and drinking  Eat meals on a regular schedule.  Limit alcohol intake to no more than 1 drink a day for nonpregnant women and 2 drinks a day for men. One drink equals 12 oz of beer, 5 oz of wine, or 1 oz of hard liquor.  Drink enough fluid to keep your urine pale yellow.  Decrease your caffeine intake, or stop using caffeine. Lifestyle  Get 7-9 hours of sleep each night, or get the amount of sleep recommended by your health care provider.  At bedtime, remove all electronic devices from your room. Electronic devices include computers, phones, and tablets.  Find ways to manage your stress. Some things   that can help relieve stress include: ? Exercise. ? Deep breathing exercises. ? Yoga. ? Listening to music. ? Positive mental imagery.  Try to sit up straight and avoid tensing your muscles.  Do not use any products that contain nicotine or tobacco, such as cigarettes and e-cigarettes. If you need help quitting, ask your health care provider. General instructions   Keep all follow-up visits as told by your health care provider. This is important.  Avoid any headache triggers. Keep a headache journal to help find out what may trigger your headaches.  For example, write down: ? What you eat and drink. ? How much sleep you get. ? Any change to your diet or medicines. Contact a health care provider if:  Your headache does not get better.  Your headache comes back.  You are sensitive to sounds, light, or smells because of a headache.  You have nausea or you vomit.  Your stomach hurts. Get help right away if:  You suddenly develop a very severe headache along with any of the following: ? A stiff neck. ? Nausea and vomiting. ? Confusion. ? Weakness. ? Double vision or loss of vision. ? Shortness of breath. ? Rash. ? Unusual sleepiness. ? Fever. ? Trouble speaking. ? Pain in your eyes or ears. ? Trouble walking or balancing. ? Feeling faint or passing out. Summary  A tension headache is a feeling of pain, pressure, or aching in the head that is often felt over the front and sides of the head.  A tension headache can last from 30 minutes to several days. It is the most common kind of headache.  This condition may be diagnosed based on your symptoms, your medical history, and a physical exam.  This condition may be treated with lifestyle changes and with medicines that help relieve symptoms. This information is not intended to replace advice given to you by your health care provider. Make sure you discuss any questions you have with your health care provider. Document Revised: 06/15/2019 Document Reviewed: 11/28/2016 Elsevier Patient Education  2020 [employer] Inc[employer]  

## 2020-05-26 LAB — HEMOGLOBIN A1C
Est. average glucose Bld gHb Est-mCnc: 111 mg/dL
Hgb A1c MFr Bld: 5.5 % (ref 4.8–5.6)

## 2020-05-26 LAB — COMPREHENSIVE METABOLIC PANEL
ALT: 13 IU/L (ref 0–32)
AST: 10 IU/L (ref 0–40)
Albumin/Globulin Ratio: 2 (ref 1.2–2.2)
Albumin: 4.2 g/dL (ref 3.7–4.7)
Alkaline Phosphatase: 53 IU/L (ref 44–121)
BUN/Creatinine Ratio: 21 (ref 12–28)
BUN: 17 mg/dL (ref 8–27)
Bilirubin Total: 0.4 mg/dL (ref 0.0–1.2)
CO2: 24 mmol/L (ref 20–29)
Calcium: 9.4 mg/dL (ref 8.7–10.3)
Chloride: 101 mmol/L (ref 96–106)
Creatinine, Ser: 0.8 mg/dL (ref 0.57–1.00)
GFR calc Af Amer: 85 mL/min/{1.73_m2} (ref >59)
GFR calc non Af Amer: 73 mL/min/{1.73_m2} (ref >59)
Globulin, Total: 2.1 g/dL (ref 1.5–4.5)
Glucose: 87 mg/dL (ref 65–99)
Potassium: 4.2 mmol/L (ref 3.5–5.2)
Sodium: 138 mmol/L (ref 134–144)
Total Protein: 6.3 g/dL (ref 6.0–8.5)

## 2020-05-26 LAB — CBC WITH DIFFERENTIAL/PLATELET
Basophils Absolute: 0.1 10*3/uL (ref 0.0–0.2)
Basos: 1 %
EOS (ABSOLUTE): 0.1 10*3/uL (ref 0.0–0.4)
Eos: 1 %
Hematocrit: 44.8 % (ref 34.0–46.6)
Hemoglobin: 14.4 g/dL (ref 11.1–15.9)
Immature Grans (Abs): 0 10*3/uL (ref 0.0–0.1)
Immature Granulocytes: 0 %
Lymphocytes Absolute: 1.8 10*3/uL (ref 0.7–3.1)
Lymphs: 28 %
MCH: 29 pg (ref 26.6–33.0)
MCHC: 32.1 g/dL (ref 31.5–35.7)
MCV: 90 fL (ref 79–97)
Monocytes Absolute: 0.5 10*3/uL (ref 0.1–0.9)
Monocytes: 8 %
Neutrophils Absolute: 3.9 10*3/uL (ref 1.4–7.0)
Neutrophils: 62 %
Platelets: 244 10*3/uL (ref 150–450)
RBC: 4.96 x10E6/uL (ref 3.77–5.28)
RDW: 13.3 % (ref 11.7–15.4)
WBC: 6.3 10*3/uL (ref 3.4–10.8)

## 2020-05-26 LAB — LIPID PANEL
Chol/HDL Ratio: 5.7 ratio — ABNORMAL HIGH (ref 0.0–4.4)
Cholesterol, Total: 252 mg/dL — ABNORMAL HIGH (ref 100–199)
HDL: 44 mg/dL (ref >39)
LDL Chol Calc (NIH): 187 mg/dL — ABNORMAL HIGH (ref 0–99)
Triglycerides: 116 mg/dL (ref 0–149)
VLDL Cholesterol Cal: 21 mg/dL (ref 5–40)

## 2020-05-26 LAB — TSH: TSH: 1.24 u[IU]/mL (ref 0.450–4.500)

## 2020-05-28 ENCOUNTER — Telehealth: Payer: Self-pay

## 2020-05-28 NOTE — Telephone Encounter (Signed)
-----   Message from Mar Daring, [place] sent at 05/28/2020  1:11 PM EDT ----- Blood count is normal. Kidney and liver function are normal. Sodium, potassium, and calcium are normal. Sugar/A1c are normal. Cholesterol has increased compared to last year. Currently your risk of having a cardiovascular event over the next 10 years is elevated at 11.3%. Recommend to take Atorvastatin 10mg  as prescribed since you had not been taking. Thyroid is normal.

## 2020-05-28 NOTE — Telephone Encounter (Signed)
Patient advised as directed below. 

## 2020-07-11 ENCOUNTER — Other Ambulatory Visit: Payer: Self-pay

## 2020-07-11 ENCOUNTER — Encounter: Payer: Self-pay | Admitting: Adult Health

## 2020-07-11 ENCOUNTER — Ambulatory Visit (INDEPENDENT_AMBULATORY_CARE_PROVIDER_SITE_OTHER): Payer: PPO | Admitting: Adult Health

## 2020-07-11 VITALS — BP 145/94 | HR 66 | Temp 98.2°F | Resp 16 | Wt 141.2 lb

## 2020-07-11 DIAGNOSIS — L03011 Cellulitis of right finger: Secondary | ICD-10-CM | POA: Diagnosis not present

## 2020-07-11 MED ORDER — CEPHALEXIN 500 MG PO CAPS: 500.0000 mg | ORAL_CAPSULE | Freq: Three times a day (TID) | ORAL | 0 refills | Status: DC

## 2020-07-11 MED ORDER — DOXYCYCLINE HYCLATE 100 MG PO TABS: 100.0000 mg | ORAL_TABLET | Freq: Two times a day (BID) | ORAL | 0 refills | Status: DC

## 2020-07-11 MED ORDER — MUPIROCIN CALCIUM 2 % EX CREA: TOPICAL_CREAM | CUTANEOUS | 0 refills | Status: DC

## 2020-07-11 NOTE — Patient Instructions (Addendum)
Paronychia Paronychia is an infection of the skin that surrounds a nail. It usually affects the skin around a fingernail, but it may also occur near a toenail. It often causes pain and swelling around the nail. In some cases, a collection of pus (abscess) can form near or under the nail.  This condition may develop suddenly, or it may develop gradually over a longer period. In most cases, paronychia is not serious, and it will clear up with treatment. What are the causes? This condition may be caused by bacteria or a fungus. These germs can enter the body through an opening in the skin, such as a cut or a hangnail. What increases the risk? This condition is more likely to develop in people who:  Get their hands wet often, such as those who work as Designer, industrial/product, bartenders, or nurses.  Bite their fingernails or suck their thumbs.  Trim their nails very short.  Have hangnails or injured fingertips.  Get manicures.  Have diabetes. What are the signs or symptoms? Symptoms of this condition include:  Redness and swelling of the skin near the nail.  Tenderness around the nail when you touch the area.  Pus-filled bumps under the skin at the base and sides of the nail (cuticle).  Fluid or pus under the nail.  Throbbing pain in the area. How is this diagnosed? This condition is diagnosed with a physical exam. In some cases, a sample of pus may be tested to determine what type of bacteria or fungus is causing the condition. How is this treated? Treatment depends on the cause and severity of your condition. If your condition is mild, it may clear up on its own in a few days or after soaking in warm water. If needed, treatment may include:  Antibiotic medicine, if your infection is caused by bacteria.  Antifungal medicine, if your infection is caused by a fungus.  A procedure to drain pus from an abscess.  Anti-inflammatory medicine (corticosteroids). Follow these instructions at  home: Wound care  Keep the affected area clean.  Soak the affected area in warm water, if told to do so by your health care provider. You may be told to do this for 20 minutes, 2-3 times a day.  Keep the area dry when you are not soaking it.  Do not try to drain an abscess yourself.  Follow instructions from your health care provider about how to take care of the affected area. Make sure you: ? Wash your hands with soap and water before you change your bandage (dressing). If soap and water are not available, use hand sanitizer. ? Change your dressing as told by your health care provider.  If you had an abscess drained, check the area every day for signs of infection. Check for: ? Redness, swelling, or pain. ? Fluid or blood. ? Warmth. ? Pus or a bad smell. Medicines   Take over-the-counter and prescription medicines only as told by your health care provider.  If you were prescribed an antibiotic medicine, take it as told by your health care provider. Do not stop taking the antibiotic even if you start to feel better. General instructions  Avoid contact with harsh chemicals.  Do not pick at the affected area. Prevention  To prevent this condition from happening again: ? Wear rubber gloves when washing dishes or doing other tasks that require your hands to get wet. ? Wear gloves if your hands might come in contact with cleaners or other chemicals. ? Avoid  injuring your nails or fingertips. ? Do not bite your nails or tear hangnails. ? Do not cut your nails very short. ? Do not cut your cuticles. ? Use clean nail clippers or scissors when trimming nails. Contact a health care provider if:  Your symptoms get worse or do not improve with treatment.  You have continued or increased fluid, blood, or pus coming from the affected area.  Your finger or knuckle becomes swollen or difficult to move. Get help right away if you have:  A fever or chills.  Redness spreading away  from the affected area.  Joint or muscle pain. Summary  Paronychia is an infection of the skin that surrounds a nail. It often causes pain and swelling around the nail. In some cases, a collection of pus (abscess) can form near or under the nail.  This condition may be caused by bacteria or a fungus. These germs can enter the body through an opening in the skin, such as a cut or a hangnail.  If your condition is mild, it may clear up on its own in a few days. If needed, treatment may include medicine or a procedure to drain pus from an abscess.  To prevent this condition from happening again, wear gloves if doing tasks that require your hands to get wet or to come in contact with chemicals. Also avoid injuring your nails or fingertips. This information is not intended to replace advice given to you by your health care provider. Make sure you discuss any questions you have with your health care provider. Document Revised: 09/04/2017 Document Reviewed: 08/31/2017 Elsevier Patient Education  [place]. Doxycycline tablets or capsules What is this medicine? DOXYCYCLINE (dox i SYE kleen) is a tetracycline antibiotic. It kills certain bacteria or stops their growth. It is used to treat many kinds of infections, like dental, skin, respiratory, and urinary tract infections. It also treats acne, Lyme disease, malaria, and certain sexually transmitted infections. This medicine may be used for other purposes; ask your health care provider or pharmacist if you have questions. COMMON BRAND NAME(S): Acticlate, Adoxa, Adoxa CK, Adoxa Pak, Adoxa TT, Alodox, Avidoxy, Doxal, LYMEPAK, Mondoxyne NL, Monodox, Morgidox 1x, Morgidox 1x Kit, Morgidox 2x, Morgidox 2x Kit, NutriDox, Ocudox, [place], [place], Vibra-Tabs, Vibramycin What should I tell my health care provider before I take this medicine? They need to know if you have any of these conditions:  liver disease  long exposure to sunlight like working  outdoors  stomach problems like colitis  an unusual or allergic reaction to doxycycline, tetracycline antibiotics, other medicines, foods, dyes, or preservatives  pregnant or trying to get pregnant  breast-feeding How should I use this medicine? Take this medicine by mouth with a full glass of water. Follow the directions on the prescription label. It is best to take this medicine without food, but if it upsets your stomach take it with food. Take your medicine at regular intervals. Do not take your medicine more often than directed. Take all of your medicine as directed even if you think you are better. Do not skip doses or stop your medicine early. Talk to your pediatrician regarding the use of this medicine in children. While this drug may be prescribed for selected conditions, precautions do apply. Overdosage: If you think you have taken too much of this medicine contact a poison control center or emergency room at once. NOTE: This medicine is only for you. Do not share this medicine with others. What if I miss  a dose? If you miss a dose, take it as soon as you can. If it is almost time for your next dose, take only that dose. Do not take double or extra doses. What may interact with this medicine?  antacids  barbiturates  birth control pills  bismuth subsalicylate  carbamazepine  methoxyflurane  other antibiotics  phenytoin  vitamins that contain iron  warfarin This list may not describe all possible interactions. Give your health care provider a list of all the medicines, herbs, non-prescription drugs, or dietary supplements you use. Also tell them if you smoke, drink alcohol, or use illegal drugs. Some items may interact with your medicine. What should I watch for while using this medicine? Tell your doctor or health care professional if your symptoms do not improve. Do not treat diarrhea with over the counter products. Contact your doctor if you have diarrhea that  lasts more than 2 days or if it is severe and watery. Do not take this medicine just before going to bed. It may not dissolve properly when you lay down and can cause pain in your throat. Drink plenty of fluids while taking this medicine to also help reduce irritation in your throat. This medicine can make you more sensitive to the sun. Keep out of the sun. If you cannot avoid being in the sun, wear protective clothing and use sunscreen. Do not use sun lamps or tanning beds/booths. Birth control pills may not work properly while you are taking this medicine. Talk to your doctor about using an extra method of birth control. If you are being treated for a sexually transmitted infection, avoid sexual contact until you have finished your treatment. Your sexual partner may also need treatment. Avoid antacids, aluminum, calcium, magnesium, and iron products for 4 hours before and 2 hours after taking a dose of this medicine. If you are using this medicine to prevent malaria, you should still protect yourself from contact with mosquitos. Stay in screened-in areas, use mosquito nets, keep your body covered, and use an insect repellent. What side effects may I notice from receiving this medicine? Side effects that you should report to your doctor or health care professional as soon as possible:  allergic reactions like skin rash, itching or hives, swelling of the face, lips, or tongue  difficulty breathing  fever  itching in the rectal or genital area  pain on swallowing  rash, fever, and swollen lymph nodes  redness, blistering, peeling or loosening of the skin, including inside the mouth  severe stomach pain or cramps  unusual bleeding or bruising  unusually weak or tired  yellowing of the eyes or skin Side effects that usually do not require medical attention (report to your doctor or health care professional if they continue or are bothersome):  diarrhea  loss of appetite  nausea,  vomiting This list may not describe all possible side effects. Call your doctor for medical advice about side effects. You may report side effects to FDA at 1-800-FDA-1088. Where should I keep my medicine? Keep out of the reach of children. Store at room temperature, below 30 degrees C (86 degrees F). Protect from light. Keep container tightly closed. Throw away any unused medicine after the expiration date. Taking this medicine after the expiration date can make you seriously ill. NOTE: This sheet is a summary. It may not cover all possible information. If you have questions about this medicine, talk to your doctor, pharmacist, or health care provider.  2020 Elsevier/Gold Standard (2018-11-18  13:44:53) Cephalexin Tablets or Capsules What is this medicine? CEPHALEXIN (sef a LEX in) is a cephalosporin antibiotic. It treats some infections caused by bacteria. It will not work for colds, the flu, or other viruses. This medicine may be used for other purposes; ask your health care provider or pharmacist if you have questions. COMMON BRAND NAME(S): Biocef, Daxbia, Keflex, Keftab What should I tell my health care provider before I take this medicine? They need to know if you have any of these conditions:  kidney disease  stomach or intestine problems, especially colitis  an unusual or allergic reaction to cephalexin, other cephalosporins, penicillins, other antibiotics, medicines, foods, dyes or preservatives  pregnant or trying to get pregnant  breast-feeding How should I use this medicine? Take this drug by mouth. Take it as directed on the prescription label at the same time every day. You can take it with or without food. If it upsets your stomach, take it with food. Take all of this drug unless your health care provider tells you to stop it early. Keep taking it even if you think you are better. Talk to your health care provider about the use of this drug in children. While it may be  prescribed for selected conditions, precautions do apply. Overdosage: If you think you have taken too much of this medicine contact a poison control center or emergency room at once. NOTE: This medicine is only for you. Do not share this medicine with others. What if I miss a dose? If you miss a dose, take it as soon as you can. If it is almost time for your next dose, take only that dose. Do not take double or extra doses. What may interact with this medicine?  probenecid  some other antibiotics This list may not describe all possible interactions. Give your health care provider a list of all the medicines, herbs, non-prescription drugs, or dietary supplements you use. Also tell them if you smoke, drink alcohol, or use illegal drugs. Some items may interact with your medicine. What should I watch for while using this medicine? Tell your doctor or health care provider if your symptoms do not begin to improve in a few days. This medicine may cause serious skin reactions. They can happen weeks to months after starting the medicine. Contact your health care provider right away if you notice fevers or flu-like symptoms with a rash. The rash may be red or purple and then turn into blisters or peeling of the skin. Or, you might notice a red rash with swelling of the face, lips or lymph nodes in your neck or under your arms. Do not treat diarrhea with over the counter products. Contact your doctor if you have diarrhea that lasts more than 2 days or if it is severe and watery. If you have diabetes, you may get a false-positive result for sugar in your urine. Check with your doctor or health care provider. What side effects may I notice from receiving this medicine? Side effects that you should report to your doctor or health care professional as soon as possible:  allergic reactions like skin rash, itching or hives, swelling of the face, lips, or tongue  breathing problems  pain or trouble passing  urine  redness, blistering, peeling or loosening of the skin, including inside the mouth  severe or watery diarrhea  unusually weak or tired  yellowing of the eyes, skin Side effects that usually do not require medical attention (report to your doctor or health  care professional if they continue or are bothersome):  gas or heartburn  genital or anal irritation  headache  joint or muscle pain  nausea, vomiting This list may not describe all possible side effects. Call your doctor for medical advice about side effects. You may report side effects to FDA at 1-800-FDA-1088. Where should I keep my medicine? Keep out of the reach of children and pets. Store at room temperature between 20 and 25 degrees C (68 and 77 degrees F). Throw away any unused drug after the expiration date. NOTE: This sheet is a summary. It may not cover all possible information. If you have questions about this medicine, talk to your doctor, pharmacist, or health care provider.  2020 Elsevier/Gold Standard (2019-03-25 11:27:00)

## 2020-07-11 NOTE — Progress Notes (Signed)
Established patient visit   Patient: Kristen Hays   DOB: Mar 05, 1946   74 y.o. Female  MRN: [id number] Visit Date: 07/11/2020  Today's healthcare provider: Marcille Buffy, FNP   Chief Complaint  Patient presents with   finger swelling   Subjective    HPI  Swollen Finger Patient presents in office today with concerns of swelling around her right thumb that has been present for 4 days. Patient has been taking tylenol as need for pain relief, she does not recall any injury to thumb. She noticed she has a red area around nail bed around for days ago. Denies any injury.   No drainage. She has been soaking in epson salt once daily.  No history of gout.  Patient  denies any fever, body aches,chills, rash, chest pain, shortness of breath, nausea, vomiting, or diarrhea.   Denies dizziness, lightheadedness, pre syncopal or syncopal episodes.       Medications: Outpatient Medications Prior to Visit  Medication Sig   aspirin 81 MG EC tablet Take 81 mg by mouth daily.   atorvastatin (LIPITOR) 10 MG tablet TAKE 1 TABLET BY MOUTH EVERY DAY (Patient not taking: Reported on 05/25/2020)   baclofen (LIORESAL) 10 MG tablet Take 1 tablet (10 mg total) by mouth 3 (three) times daily as needed (headaches).   Cholecalciferol (VITAMIN D) 2000 UNITS CAPS Take 1 capsule by mouth daily.   Cyanocobalamin (VITAMIN B12 PO) Take 2,500 mg by mouth.   escitalopram (LEXAPRO) 10 MG tablet Take 1.5 tablets (15 mg total) by mouth at bedtime.   gabapentin (NEURONTIN) 600 MG tablet Take 0.5 tablets (300 mg total) by mouth at bedtime. (Patient not taking: Reported on 05/25/2020)   linaclotide (LINZESS) 72 MCG capsule Take 72 mcg by mouth daily before breakfast.    pantoprazole (PROTONIX) 40 MG tablet Take 40 mg by mouth 2 (two) times daily before a meal.    No facility-administered medications prior to visit.    Review of Systems  Constitutional: Negative.   HENT: Negative.     Respiratory: Negative.   Cardiovascular: Negative.   Genitourinary: Negative.   Musculoskeletal: Negative.   Skin: Positive for color change. Negative for pallor, rash and wound.  Neurological: Negative.   Psychiatric/Behavioral: Negative.     Last CBC Lab Results  Component Value Date   WBC 6.3 05/25/2020   HGB 14.4 05/25/2020   HCT 44.8 05/25/2020   MCV 90 05/25/2020   MCH 29.0 05/25/2020   RDW 13.3 05/25/2020   PLT 244 52/84/1324   Last metabolic panel Lab Results  Component Value Date   GLUCOSE 87 05/25/2020   NA 138 05/25/2020   K 4.2 05/25/2020   CL 101 05/25/2020   CO2 24 05/25/2020   BUN 17 05/25/2020   CREATININE 0.80 05/25/2020   GFRNONAA 73 05/25/2020   GFRAA 85 05/25/2020   CALCIUM 9.4 05/25/2020   PROT 6.3 05/25/2020   ALBUMIN 4.2 05/25/2020   LABGLOB 2.1 05/25/2020   AGRATIO 2.0 05/25/2020   BILITOT 0.4 05/25/2020   ALKPHOS 53 05/25/2020   AST 10 05/25/2020   ALT 13 05/25/2020      Objective    BP (!) 145/94    Pulse 66    Temp 98.2 F (36.8 C) (Oral)    Resp 16    Wt 141 lb 3.2 oz (64 kg)    BMI 24.24 kg/m  BP Readings from Last 3 Encounters:  07/11/20 (!) 145/94  05/25/20 115/70  01/13/20 128/66  Wt Readings from Last 3 Encounters:  07/11/20 141 lb 3.2 oz (64 kg)  05/25/20 140 lb (63.5 kg)  01/13/20 141 lb 12.8 oz (64.3 kg)      Physical Exam Nursing note reviewed.  Constitutional:      General: She is not in acute distress.    Appearance: Normal appearance. She is not ill-appearing, toxic-appearing or diaphoretic.  HENT:     Head: Normocephalic and atraumatic.     Right Ear: There is no impacted cerumen.     Left Ear: There is no impacted cerumen.     Nose: Nose normal.     Mouth/Throat:     Mouth: Mucous membranes are moist.  Eyes:     Pupils: Pupils are equal, round, and reactive to light.  Cardiovascular:     Pulses: Normal pulses.     Heart sounds: Normal heart sounds.  Pulmonary:     Effort: Pulmonary effort is  normal.     Breath sounds: Normal breath sounds.  Abdominal:     Palpations: Abdomen is soft.  Musculoskeletal:        General: Normal range of motion.       Hands:     Cervical back: Normal range of motion and neck supple.     Comments: Erythema and swelling of right thumb skin lateral nail fold and below cuticle as marked on diagram, tender to touch, no obvious drainage or induration, no benefit seen today for I& D. Is tender to touch. Normal sensation. No obvious ingrown area.  Normal range of motion in thumb/ hand.  Radial pulse 2+.  Nail bed intact.    Skin:    Capillary Refill: Capillary refill takes less than 2 seconds.     Findings: Erythema present.     Comments: See above.   Neurological:     General: No focal deficit present.     Mental Status: She is alert.  Psychiatric:        Mood and Affect: Mood normal.        Behavior: Behavior normal.        Thought Content: Thought content normal.        Judgment: Judgment normal.       No results found for any visits on 07/11/20.  Assessment & Plan     Paronychia of right thumb   Meds ordered this encounter  Medications   cephALEXin (KEFLEX) 500 MG capsule    Sig: Take 1 capsule (500 mg total) by mouth 3 (three) times daily.    Dispense:  30 capsule    Refill:  0   doxycycline (VIBRA-TABS) 100 MG tablet    Sig: Take 1 tablet (100 mg total) by mouth 2 (two) times daily.    Dispense:  10 tablet    Refill:  0   mupirocin cream (BACTROBAN) 2 %    Sig: Apply thin layer only as directed 1- 2 times daily topical.    Dispense:  15 g    Refill:  0  discussed how to take medications, eat throughout day, not laying down flat with Doxycycline and taking 2 hours apart from keflex.  Call if questions or not improving.   Epson salt warm soaks advised twice daily advised.   Return in about 1 week (around 07/18/2020), or if symptoms worsen or fail to improve, for at any time for any worsening symptoms, Go to Emergency  room/ urgent care if worse.     Red Flags discussed. The patient was  given clear instructions to go to ER or return to medical center if any red flags develop, symptoms do not improve, worsen or new problems develop. They verbalized understanding.    Marcille Buffy, [place] [phone] (phone) [phone] (fax)  [place]

## 2020-07-12 MED ORDER — MUPIROCIN 2 % EX OINT: 1.0000 "application " | TOPICAL_OINTMENT | Freq: Two times a day (BID) | CUTANEOUS | 0 refills | Status: DC

## 2020-07-12 NOTE — Addendum Note (Signed)
Addended by: Doreen Beam on: 07/12/2020 08:55 AM   Modules accepted: Orders

## 2020-07-18 ENCOUNTER — Encounter: Payer: Self-pay | Admitting: Adult Health

## 2020-07-18 ENCOUNTER — Ambulatory Visit (INDEPENDENT_AMBULATORY_CARE_PROVIDER_SITE_OTHER): Payer: PPO | Admitting: Adult Health

## 2020-07-18 ENCOUNTER — Other Ambulatory Visit: Payer: Self-pay

## 2020-07-18 VITALS — BP 113/73 | HR 64 | Temp 98.3°F | Wt 140.0 lb

## 2020-07-18 DIAGNOSIS — D492 Neoplasm of unspecified behavior of bone, soft tissue, and skin: Secondary | ICD-10-CM | POA: Diagnosis not present

## 2020-07-18 DIAGNOSIS — L03011 Cellulitis of right finger: Secondary | ICD-10-CM | POA: Diagnosis not present

## 2020-07-18 MED ORDER — CEPHALEXIN 500 MG PO CAPS: 500.0000 mg | ORAL_CAPSULE | Freq: Three times a day (TID) | ORAL | 0 refills | Status: DC

## 2020-07-18 NOTE — Patient Instructions (Signed)
Paronychia Paronychia is an infection of the skin. It happens near a fingernail or toenail. It may cause pain and swelling around the nail. In some cases, a fluid-filled bump (abscess) can form near or under the nail. Usually, this condition is not serious, and it clears up with treatment. Follow these instructions at home: Wound care  Keep the affected area clean.  Soak the fingers or toes in warm water as told by your doctor. You may be told to do this for 20 minutes, 2-3 times a day.  Keep the area dry when you are not soaking it.  Do not try to drain a fluid-filled bump on your own.  Follow instructions from your doctor about how to take care of the affected area. Make sure you: ? Wash your hands with soap and water before you change your bandage (dressing). If you cannot use soap and water, use hand sanitizer. ? Change your bandage as told by your doctor.  If you had a fluid-filled bump and your doctor drained it, check the area every day for signs of infection. Check for: ? Redness, swelling, or pain. ? Fluid or blood. ? Warmth. ? Pus or a bad smell. Medicines   Take over-the-counter and prescription medicines only as told by your doctor.  If you were prescribed an antibiotic medicine, take it as told by your doctor. Do not stop taking it even if you start to feel better. General instructions  Avoid touching any chemicals.  Do not pick at the affected area. Prevention  To prevent this condition from happening again: ? Wear rubber gloves when putting your hands in water for washing dishes or other tasks. ? Wear gloves if your hands might touch cleaners or chemicals. ? Avoid injuring your nails or fingertips. ? Do not bite your nails or tear hangnails. ? Do not cut your nails very short. ? Do not cut the skin at the base and sides of the nail (cuticles). ? Use clean nail clippers or scissors when trimming nails. Contact a doctor if:  You feel worse.  You do not get  better.  You have more fluid, blood, or pus coming from the affected area.  Your finger or knuckle is swollen or is hard to move. Get help right away if you have:  A fever or chills.  Redness spreading from the affected area.  Pain in a joint or muscle. Summary  Paronychia is an infection of the skin. It happens near a fingernail or toenail.  This condition may cause pain and swelling around the nail.  Soak the fingers or toes in warm water as told by your doctor.  Usually, this condition is not serious, and it clears up with treatment. This information is not intended to replace advice given to you by your health care provider. Make sure you discuss any questions you have with your health care provider. Document Revised: 09/04/2017 Document Reviewed: 08/31/2017 Elsevier Patient Education  2020 [employer]  

## 2020-07-18 NOTE — Progress Notes (Signed)
Established patient visit   Patient: Kristen Hays   DOB: [birth year]   74 y.o. Female  MRN: [id number] Visit Date: 07/18/2020  Today's healthcare provider: [place] Lucilia Yanni, FNP    Subjective    HPI    Follow up for Paronychia of right thumb  The patient was last seen for this 7 days ago. Changes made at last visit include starting Keflex, doxycycline, and mupirocin cream. Epson salt soaks.   She reports excellent compliance with treatment. She feels that condition is Improved, but not completely gone. She is not having side effects.  She has completed antibiotics.  She has just as small area of very small redness on the outer edge of her finger she reports " all else has cleared up and no pain" .  -----------------------------------------------------------------------------------------  Incidentally she asks regarding a skin growth that has been presently on this same finger for years and is getting larger, hurts at times. Denies pain in hand or finger and does not limit range of motion. She denies any drainage or previous injury.    Patient  denies any fever, body aches,chills, rash, chest pain, shortness of breath, nausea, vomiting, or diarrhea.   Patient Active Problem List   Diagnosis Date Noted  . Skin growth- finger  07/18/2020  . Paronychia of right thumb 07/11/2020  . Breast pain, right 01/13/2020  . History of reconstruction of right breast 01/13/2020  . History of cancer of right breast 01/08/2017  . Acquired absence of right breast and nipple 01/08/2017  . Depression, major, single episode, mild ([place]) 01/08/2017  . Dermatitis 02/26/2016  . Tarsal tunnel syndrome of right side 06/21/2015  . Anxiety 05/18/2015  . Globus hystericus 05/18/2015  . Blood in the urine 05/18/2015  . 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 HLD (hyperlipidemia) 05/18/2015  . LBP (low back pain) 05/18/2015  . 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 Cervical pain 05/18/2015  .  Barrett esophagus 03/04/2015  . H/O adenomatous polyp of colon 03/04/2015  . Abdominal bloating 12/28/2014   Past Medical History:  Diagnosis Date  . Anxiety   . Barrett esophagus 2016  . Breast cancer ([place])    right breast c Mastectomy   . Dysphagia   . High cholesterol   . Hypothyroidism    Social History   Tobacco Use  . Smoking status: Never Smoker  . Smokeless tobacco: Never Used  Vaping Use  . Vaping Use: Never used  Substance Use Topics  . Alcohol use: No  . Drug use: No   No Known Allergies   Medications: Outpatient Medications Prior to Visit  Medication Sig  . aspirin 81 MG EC tablet Take 81 mg by mouth daily.  . baclofen (LIORESAL) 10 MG tablet Take 1 tablet (10 mg total) by mouth 3 (three) times daily as needed (headaches).  . Cholecalciferol (VITAMIN D) 2000 UNITS CAPS Take 1 capsule by mouth daily.  . Cyanocobalamin (VITAMIN B12 PO) Take 2,500 mg by mouth.  . escitalopram (LEXAPRO) 10 MG tablet Take 1.5 tablets (15 mg total) by mouth at bedtime.  Marland Kitchen linaclotide (LINZESS) 72 MCG capsule Take 72 mcg by mouth daily before breakfast.   . mupirocin ointment (BACTROBAN) 2 % Apply 1 application topically 2 (two) times daily. Apply thin layer to affected area.  . pantoprazole (PROTONIX) 40 MG tablet Take 40 mg by mouth 2 (two) times daily before a meal.   . [DISCONTINUED] cephALEXin (KEFLEX) 500 MG capsule Take 1 capsule (500 mg total) by  mouth 3 (three) times daily.  Marland Kitchen atorvastatin (LIPITOR) 10 MG tablet TAKE 1 TABLET BY MOUTH EVERY DAY (Patient not taking: Reported on 05/25/2020)  . doxycycline (VIBRA-TABS) 100 MG tablet Take 1 tablet (100 mg total) by mouth 2 (two) times daily.  Marland Kitchen gabapentin (NEURONTIN) 600 MG tablet Take 0.5 tablets (300 mg total) by mouth at bedtime. (Patient not taking: Reported on 05/25/2020)   No facility-administered medications prior to visit.    Review of Systems  Constitutional: Negative.   HENT: Negative.   Respiratory: Negative.     Gastrointestinal: Negative.   Genitourinary: Negative.   Musculoskeletal: Negative.   Skin: Positive for color change. Negative for pallor, rash and wound.  Psychiatric/Behavioral: Negative.       Objective    BP 113/73 (BP Location: Left Arm, Patient Position: Sitting, Cuff Size: Large)   Pulse 64   Temp 98.3 F (36.8 C) (Oral)   Wt 140 lb (63.5 kg)   SpO2 98%   BMI 24.03 kg/m    Physical Exam Vitals reviewed.  Constitutional:      General: She is not in acute distress.    Appearance: Normal appearance. She is not ill-appearing, toxic-appearing or diaphoretic.  HENT:     Head: Normocephalic and atraumatic.     Right Ear: External ear normal.     Left Ear: External ear normal.     Mouth/Throat:     Pharynx: No oropharyngeal exudate or posterior oropharyngeal erythema.  Eyes:     General: No scleral icterus.       Right eye: No discharge.        Left eye: No discharge.     Extraocular Movements: Extraocular movements intact.  Cardiovascular:     Rate and Rhythm: Normal rate and regular rhythm.     Pulses: Normal pulses.     Heart sounds: Normal heart sounds.  Pulmonary:     Effort: Pulmonary effort is normal.     Breath sounds: Normal breath sounds.  Abdominal:     Palpations: Abdomen is soft.  Musculoskeletal:        General: Normal range of motion.     Right hand: Normal.     Left hand: Normal.       Hands:  Skin:    General: Skin is warm.  Neurological:     General: No focal deficit present.     Mental Status: She is oriented to person, place, and time.     Gait: Gait normal.  Psychiatric:        Mood and Affect: Mood normal.        Behavior: Behavior normal.        Thought Content: Thought content normal.        Judgment: Judgment normal.     No results found for any visits on 07/18/20.  Assessment & Plan     1. Paronychia of right thumb  Resolving, almost completley resolved. No ingrown nail noted. Will give Keflex five more days if needed-  can hold otherwise. Continue Bactroban for another 1 week and PRN.  Return if not all the way resolved.   2. Skin growth- finger  Suspect cystic lesion of right distal proximal pointer finger. Present for years.  - DG Hand Complete Right - Ambulatory referral to Dermatology   Call if not heard from Dermatology in 2 weeks.   Red Flags discussed. The patient was given clear instructions to go to ER or return to medical center if any red flags  develop, symptoms do not improve, worsen or new problems develop. They verbalized understanding.  Return if symptoms worsen or fail to improve, for at any time for any worsening symptoms, Go to Emergency room/ urgent care if worse.    keep regular follow up's with primary care provider.   Marcille Buffy, [place] [phone] (phone) [phone] (fax)  [place]

## 2020-08-02 DIAGNOSIS — L920 Granuloma annulare: Secondary | ICD-10-CM | POA: Diagnosis not present

## 2020-08-02 DIAGNOSIS — D485 Neoplasm of uncertain behavior of skin: Secondary | ICD-10-CM | POA: Diagnosis not present

## 2020-08-02 DIAGNOSIS — D2239 Melanocytic nevi of other parts of face: Secondary | ICD-10-CM | POA: Diagnosis not present

## 2020-08-02 DIAGNOSIS — D239 Other benign neoplasm of skin, unspecified: Secondary | ICD-10-CM | POA: Diagnosis not present

## 2020-09-12 DIAGNOSIS — M2142 Flat foot [pes planus] (acquired), left foot: Secondary | ICD-10-CM | POA: Diagnosis not present

## 2020-09-12 DIAGNOSIS — S93401S Sprain of unspecified ligament of right ankle, sequela: Secondary | ICD-10-CM | POA: Diagnosis not present

## 2020-09-12 DIAGNOSIS — M25571 Pain in right ankle and joints of right foot: Secondary | ICD-10-CM | POA: Diagnosis not present

## 2020-09-12 DIAGNOSIS — M25371 Other instability, right ankle: Secondary | ICD-10-CM | POA: Diagnosis not present

## 2020-09-12 DIAGNOSIS — M216X2 Other acquired deformities of left foot: Secondary | ICD-10-CM | POA: Diagnosis not present

## 2020-09-12 DIAGNOSIS — M7741 Metatarsalgia, right foot: Secondary | ICD-10-CM | POA: Diagnosis not present

## 2020-09-12 DIAGNOSIS — G5761 Lesion of plantar nerve, right lower limb: Secondary | ICD-10-CM | POA: Diagnosis not present

## 2020-09-12 DIAGNOSIS — M7731 Calcaneal spur, right foot: Secondary | ICD-10-CM | POA: Diagnosis not present

## 2020-09-12 DIAGNOSIS — M2141 Flat foot [pes planus] (acquired), right foot: Secondary | ICD-10-CM | POA: Diagnosis not present

## 2020-09-12 DIAGNOSIS — G629 Polyneuropathy, unspecified: Secondary | ICD-10-CM | POA: Diagnosis not present

## 2020-09-12 DIAGNOSIS — M216X1 Other acquired deformities of right foot: Secondary | ICD-10-CM | POA: Diagnosis not present

## 2020-10-26 ENCOUNTER — Other Ambulatory Visit: Payer: Self-pay | Admitting: Physician Assistant

## 2020-10-26 DIAGNOSIS — F321 Major depressive disorder, single episode, moderate: Secondary | ICD-10-CM

## 2020-10-29 ENCOUNTER — Encounter: Payer: Self-pay | Admitting: Physician Assistant

## 2020-10-31 ENCOUNTER — Telehealth: Payer: Self-pay

## 2020-10-31 NOTE — Telephone Encounter (Signed)
Pt advised of lab results.  She will call back if she decides to get her labs rechecked.   Thanks,   -Mickel Baas

## 2020-10-31 NOTE — Telephone Encounter (Signed)
Copied from [place] [id number]. Topic: General - Other >> Oct 31, 2020 10:22 AM Leward Quan A wrote: Reason for CRM: Patient called in to inform Anderson Malta that she had a life screening and received her results that say her Creatinine was low and need to know if that is something she should be worrying about. Please call  Ph# [phone]

## 2020-10-31 NOTE — Telephone Encounter (Signed)
Sent patient a Pharmacist, community message when received. Please see chart for message

## 2020-11-16 DIAGNOSIS — D3132 Benign neoplasm of left choroid: Secondary | ICD-10-CM | POA: Diagnosis not present

## 2020-11-28 DIAGNOSIS — E782 Mixed hyperlipidemia: Secondary | ICD-10-CM | POA: Diagnosis not present

## 2020-11-28 DIAGNOSIS — I208 Other forms of angina pectoris: Secondary | ICD-10-CM | POA: Diagnosis not present

## 2020-11-28 DIAGNOSIS — Z0000 Encounter for general adult medical examination without abnormal findings: Secondary | ICD-10-CM | POA: Diagnosis not present

## 2020-11-28 DIAGNOSIS — Z9011 Acquired absence of right breast and nipple: Secondary | ICD-10-CM | POA: Diagnosis not present

## 2020-11-28 DIAGNOSIS — F411 Generalized anxiety disorder: Secondary | ICD-10-CM | POA: Diagnosis not present

## 2020-12-24 ENCOUNTER — Ambulatory Visit: Payer: Self-pay | Admitting: Family Medicine

## 2020-12-24 ENCOUNTER — Ambulatory Visit: Payer: Self-pay | Admitting: Adult Health

## 2020-12-24 ENCOUNTER — Ambulatory Visit: Payer: Self-pay | Admitting: Physician Assistant

## 2020-12-28 NOTE — Progress Notes (Signed)
Established patient visit   Patient: Kristen Hays   DOB: Aug 27, 1946   75 y.o. Female  MRN: [id number] Visit Date: 01/01/2021  Today's healthcare provider: Lavon Paganini, MD   Chief Complaint  Patient presents with  . Depression  . Hyperlipidemia  . Hypothyroidism   Subjective    HPI  Depression, Follow-up  She  was last seen for this 8 months ago. Changes made at last visit include increase Lexapro 15mg  .   She reports excellent compliance with treatment. She is not having side effects.   She reports excellent tolerance of treatment. Current symptoms include: insomnia She feels she is Unchanged since last visit.  Depression screen [facility] 2/9 01/01/2021 05/25/2020 04/11/2020  Decreased Interest 0 0 0  Down, Depressed, Hopeless 0 0 0  PHQ - 2 Score 0 0 0  Altered sleeping 2 0 -  Tired, decreased energy 0 1 -  Change in appetite 0 0 -  Feeling bad or failure about yourself  0 1 -  Trouble concentrating 0 0 -  Moving slowly or fidgety/restless 0 0 -  Suicidal thoughts 0 0 -  PHQ-9 Score 2 2 -  Difficult doing work/chores Not difficult at all Not difficult at all -    ----------------------------------------------------------------------------------------- Lipid/Cholesterol, Follow-up  Last lipid panel Other pertinent labs  Lab Results  Component Value Date   CHOL 252 (H) 05/25/2020   HDL 44 05/25/2020   LDLCALC 187 (H) 05/25/2020   TRIG 116 05/25/2020   CHOLHDL 5.7 (H) 05/25/2020   Lab Results  Component Value Date   ALT 13 05/25/2020   AST 10 05/25/2020   PLT 244 05/25/2020   TSH 1.240 05/25/2020     She was last seen for this 8 months ago.  Management since that visit includes restart Atorvastatin 40mg  .  She reports excellent compliance with treatment. She is not having side effects.   Symptoms: No chest pain No chest pressure/discomfort  No dyspnea No lower extremity edema  Yes numbness or tingling of extremity No orthopnea  No  palpitations No paroxysmal nocturnal dyspnea  No speech difficulty No syncope   Current diet: in general, an "unhealthy" diet Current exercise: walking  The 10-year ASCVD risk score Mikey Bussing DC Jr., et al., 2013) is: 20.7%  --------------------------------------------------------------------------------------------------- No h/o hypothyroidism by labs and not on synthroid.  Having issues with weight gain.  Patient Active Problem List   Diagnosis Date Noted  . Weight gain 01/01/2021  . Major depressive disorder, single episode, moderate ([place]) 01/01/2021  . History of reconstruction of right breast 01/13/2020  . History of cancer of right breast 01/08/2017  . Acquired absence of right breast and nipple 01/08/2017  . Tarsal tunnel syndrome of right side 06/21/2015  . Anxiety 05/18/2015  . 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 Hypercholesterolemia 05/18/2015  . Barrett esophagus 03/04/2015  . H/O adenomatous polyp of colon 03/04/2015   Social History   Tobacco Use  . Smoking status: Never Smoker  . Smokeless tobacco: Never Used  Vaping Use  . Vaping Use: Never used  Substance Use Topics  . Alcohol use: No  . Drug use: No   No Known Allergies     Medications: Outpatient Medications Prior to Visit  Medication Sig  . aspirin 81 MG EC tablet Take 81 mg by mouth daily.  Marland Kitchen atorvastatin (LIPITOR) 10 MG tablet TAKE 1 TABLET BY MOUTH EVERY DAY  . baclofen (LIORESAL) 10 MG tablet Take 1 tablet (10 mg total) by  mouth 3 (three) times daily as needed (headaches).  . Cholecalciferol (VITAMIN D) 2000 UNITS CAPS Take 1 capsule by mouth daily.  Marland Kitchen escitalopram (LEXAPRO) 10 MG tablet START WITH 1/2 TABLET BY MOUTH NIGHTLY AT BEDTIME FOR 1 WEEK THEN INCREASE TO 1 TAB  . linaclotide (LINZESS) 72 MCG capsule Take 72 mcg by mouth daily before breakfast.   . pantoprazole (PROTONIX) 40 MG tablet Take 40 mg by mouth 2 (two) times daily before a meal.   . [DISCONTINUED] cephALEXin (KEFLEX) 500 MG capsule  Take 1 capsule (500 mg total) by mouth 3 (three) times daily. (Patient not taking: Reported on 01/01/2021)  . [DISCONTINUED] Cyanocobalamin (VITAMIN B12 PO) Take 2,500 mg by mouth. (Patient not taking: Reported on 01/01/2021)  . [DISCONTINUED] doxycycline (VIBRA-TABS) 100 MG tablet Take 1 tablet (100 mg total) by mouth 2 (two) times daily. (Patient not taking: Reported on 01/01/2021)  . [DISCONTINUED] gabapentin (NEURONTIN) 600 MG tablet Take 0.5 tablets (300 mg total) by mouth at bedtime. (Patient not taking: Reported on 01/01/2021)  . [DISCONTINUED] mupirocin ointment (BACTROBAN) 2 % Apply 1 application topically 2 (two) times daily. Apply thin layer to affected area. (Patient not taking: Reported on 01/01/2021)   No facility-administered medications prior to visit.    Review of Systems - per HPI     Objective    BP 115/67 (BP Location: Left Arm, Patient Position: Sitting, Cuff Size: Normal)   Pulse 66   Temp 98.6 F (37 C) (Oral)   Resp 16   Ht 5\' 4"  (1.626 m)   Wt 141 lb 9.6 oz (64.2 kg)   SpO2 98%   BMI 24.31 kg/m  BP Readings from Last 3 Encounters:  01/01/21 115/67  07/18/20 113/73  07/11/20 (!) 145/94   Wt Readings from Last 3 Encounters:  01/01/21 141 lb 9.6 oz (64.2 kg)  07/18/20 140 lb (63.5 kg)  07/11/20 141 lb 3.2 oz (64 kg)       Physical Exam Vitals reviewed.  Constitutional:      General: She is not in acute distress.    Appearance: Normal appearance. She is well-developed. She is not diaphoretic.  HENT:     Head: Normocephalic and atraumatic.  Eyes:     General: No scleral icterus.    Conjunctiva/sclera: Conjunctivae normal.  Neck:     Thyroid: No thyromegaly.  Cardiovascular:     Rate and Rhythm: Normal rate and regular rhythm.     Pulses: Normal pulses.     Heart sounds: Normal heart sounds. No murmur heard.   Pulmonary:     Effort: Pulmonary effort is normal. No respiratory distress.     Breath sounds: Normal breath sounds. No wheezing, rhonchi or  rales.  Musculoskeletal:     Cervical back: Neck supple.     Right lower leg: No edema.     Left lower leg: No edema.  Lymphadenopathy:     Cervical: No cervical adenopathy.  Skin:    General: Skin is warm and dry.     Findings: No rash.  Neurological:     Mental Status: She is alert and oriented to person, place, and time. Mental status is at baseline.  Psychiatric:        Mood and Affect: Mood normal.        Behavior: Behavior normal.       No results found for any visits on 01/01/21.  Assessment & Plan     Problem List Items Addressed This Visit  Other   Hypercholesterolemia    Previously elevated Continue statin  Recheck CMP and FLP Consider higher dose statin pending results      Relevant Orders   Comprehensive metabolic panel   Lipid panel   Weight gain    No h/o hypothyroidism, despite note in chart Will check TSH Discussed importance of healthy weight management Discussed diet and exercise       Relevant Orders   TSH   Major depressive disorder, single episode, moderate ([place]) - Primary    Well controlled Continue lexapro Encourage therapy          Return in about 6 months (around 07/04/2021) for CPE, AWV.      I, Lavon Paganini, MD, have reviewed all documentation for this visit. The documentation on 01/01/21 for the exam, diagnosis, procedures, and orders are all accurate and complete.   Annjanette Wertenberger, Dionne Bucy, MD, MPH [facility]

## 2021-01-01 ENCOUNTER — Other Ambulatory Visit: Payer: Self-pay

## 2021-01-01 ENCOUNTER — Encounter: Payer: Self-pay | Admitting: Family Medicine

## 2021-01-01 ENCOUNTER — Ambulatory Visit (INDEPENDENT_AMBULATORY_CARE_PROVIDER_SITE_OTHER): Payer: PPO | Admitting: Family Medicine

## 2021-01-01 VITALS — BP 115/67 | HR 66 | Temp 98.6°F | Resp 16 | Ht 64.0 in | Wt 141.6 lb

## 2021-01-01 DIAGNOSIS — E7800 Pure hypercholesterolemia, unspecified: Secondary | ICD-10-CM | POA: Diagnosis not present

## 2021-01-01 DIAGNOSIS — F321 Major depressive disorder, single episode, moderate: Secondary | ICD-10-CM | POA: Diagnosis not present

## 2021-01-01 DIAGNOSIS — F325 Major depressive disorder, single episode, in full remission: Secondary | ICD-10-CM | POA: Insufficient documentation

## 2021-01-01 DIAGNOSIS — R635 Abnormal weight gain: Secondary | ICD-10-CM | POA: Diagnosis not present

## 2021-01-01 NOTE — Assessment & Plan Note (Signed)
No h/o hypothyroidism, despite note in chart Will check TSH Discussed importance of healthy weight management Discussed diet and exercise

## 2021-01-01 NOTE — Patient Instructions (Signed)
   The CDC recommends two doses of Shingrix (the shingles vaccine) separated by 2 to 6 months for adults age 74 years and older. I recommend checking with your insurance plan regarding coverage for this vaccine.   

## 2021-01-01 NOTE — Assessment & Plan Note (Signed)
Previously elevated Continue statin  Recheck CMP and FLP Consider higher dose statin pending results

## 2021-01-01 NOTE — Assessment & Plan Note (Signed)
Well controlled Continue lexapro Encourage therapy

## 2021-01-02 LAB — LIPID PANEL
Chol/HDL Ratio: 3.5 ratio (ref 0.0–4.4)
Cholesterol, Total: 149 mg/dL (ref 100–199)
HDL: 43 mg/dL (ref >39)
LDL Chol Calc (NIH): 89 mg/dL (ref 0–99)
Triglycerides: 88 mg/dL (ref 0–149)
VLDL Cholesterol Cal: 17 mg/dL (ref 5–40)

## 2021-01-02 LAB — COMPREHENSIVE METABOLIC PANEL
ALT: 16 IU/L (ref 0–32)
AST: 13 IU/L (ref 0–40)
Albumin/Globulin Ratio: 2.3 — ABNORMAL HIGH (ref 1.2–2.2)
Albumin: 4.2 g/dL (ref 3.7–4.7)
Alkaline Phosphatase: 61 IU/L (ref 44–121)
BUN/Creatinine Ratio: 19 (ref 12–28)
BUN: 14 mg/dL (ref 8–27)
Bilirubin Total: 0.6 mg/dL (ref 0.0–1.2)
CO2: 23 mmol/L (ref 20–29)
Calcium: 9 mg/dL (ref 8.7–10.3)
Chloride: 104 mmol/L (ref 96–106)
Creatinine, Ser: 0.72 mg/dL (ref 0.57–1.00)
Globulin, Total: 1.8 g/dL (ref 1.5–4.5)
Glucose: 98 mg/dL (ref 65–99)
Potassium: 4 mmol/L (ref 3.5–5.2)
Sodium: 140 mmol/L (ref 134–144)
Total Protein: 6 g/dL (ref 6.0–8.5)
eGFR: 88 mL/min/{1.73_m2} (ref >59)

## 2021-01-02 LAB — TSH: TSH: 0.644 u[IU]/mL (ref 0.450–4.500)

## 2021-02-08 ENCOUNTER — Other Ambulatory Visit: Payer: Self-pay | Admitting: Family Medicine

## 2021-02-08 DIAGNOSIS — Z1231 Encounter for screening mammogram for malignant neoplasm of breast: Secondary | ICD-10-CM

## 2021-03-01 DIAGNOSIS — M2142 Flat foot [pes planus] (acquired), left foot: Secondary | ICD-10-CM | POA: Diagnosis not present

## 2021-03-01 DIAGNOSIS — M545 Low back pain, unspecified: Secondary | ICD-10-CM | POA: Diagnosis not present

## 2021-03-01 DIAGNOSIS — M2012 Hallux valgus (acquired), left foot: Secondary | ICD-10-CM | POA: Diagnosis not present

## 2021-03-01 DIAGNOSIS — G629 Polyneuropathy, unspecified: Secondary | ICD-10-CM | POA: Diagnosis not present

## 2021-03-01 DIAGNOSIS — M216X1 Other acquired deformities of right foot: Secondary | ICD-10-CM | POA: Diagnosis not present

## 2021-03-01 DIAGNOSIS — M216X2 Other acquired deformities of left foot: Secondary | ICD-10-CM | POA: Diagnosis not present

## 2021-03-01 DIAGNOSIS — M2011 Hallux valgus (acquired), right foot: Secondary | ICD-10-CM | POA: Diagnosis not present

## 2021-03-01 DIAGNOSIS — M2141 Flat foot [pes planus] (acquired), right foot: Secondary | ICD-10-CM | POA: Diagnosis not present

## 2021-03-13 ENCOUNTER — Other Ambulatory Visit: Payer: Self-pay

## 2021-03-13 ENCOUNTER — Ambulatory Visit
Admission: RE | Admit: 2021-03-13 | Discharge: 2021-03-13 | Disposition: A | Discharge status: Home / Self Care | Payer: PPO | Source: Ambulatory Visit | Attending: Family Medicine | Admitting: Family Medicine

## 2021-03-13 DIAGNOSIS — Z1231 Encounter for screening mammogram for malignant neoplasm of breast: Secondary | ICD-10-CM | POA: Diagnosis not present

## 2021-03-22 DIAGNOSIS — M542 Cervicalgia: Secondary | ICD-10-CM | POA: Diagnosis not present

## 2021-03-22 DIAGNOSIS — R519 Headache, unspecified: Secondary | ICD-10-CM | POA: Diagnosis not present

## 2021-03-22 DIAGNOSIS — R200 Anesthesia of skin: Secondary | ICD-10-CM | POA: Diagnosis not present

## 2021-03-22 DIAGNOSIS — Z79899 Other long term (current) drug therapy: Secondary | ICD-10-CM | POA: Diagnosis not present

## 2021-03-22 DIAGNOSIS — E559 Vitamin D deficiency, unspecified: Secondary | ICD-10-CM | POA: Diagnosis not present

## 2021-03-25 ENCOUNTER — Other Ambulatory Visit: Payer: Self-pay | Admitting: Neurology

## 2021-03-25 ENCOUNTER — Telehealth: Payer: Self-pay | Admitting: Family Medicine

## 2021-03-25 DIAGNOSIS — G8929 Other chronic pain: Secondary | ICD-10-CM

## 2021-03-25 DIAGNOSIS — F321 Major depressive disorder, single episode, moderate: Secondary | ICD-10-CM

## 2021-03-25 MED ORDER — ESCITALOPRAM OXALATE 10 MG PO TABS: 10.0000 mg | ORAL_TABLET | Freq: Every day | ORAL | 1 refills | Status: DC

## 2021-03-25 NOTE — Telephone Encounter (Signed)
[facility] faxed refill request for the following medications:  escitalopram (LEXAPRO) 10 MG tablet    Please advise.

## 2021-04-02 ENCOUNTER — Ambulatory Visit: Payer: PPO

## 2021-04-03 ENCOUNTER — Telehealth: Payer: Self-pay

## 2021-04-03 NOTE — Telephone Encounter (Signed)
Please advise. Thanks.  

## 2021-04-03 NOTE — Telephone Encounter (Signed)
Copied from [place] [id number]. Topic: General - Other >> Apr 03, 2021 11:55 AM Leward Quan A wrote: Reason for CRM: Patient called in to inform Dr B that she tested positive for Covid on Monday 04/01/21 have low grade fever, productive cough, chest congestion would like something prescribed please, no appointments available. Can be reached at Ph# [phone]

## 2021-04-04 MED ORDER — NIRMATRELVIR/RITONAVIR (PAXLOVID) TABLET (RENAL DOSING): 2.0000 | ORAL_TABLET | Freq: Two times a day (BID) | ORAL | 0 refills | Status: AC

## 2021-04-04 NOTE — Telephone Encounter (Signed)
Patient has made an additional call regarding this matter  Please contact further when possible

## 2021-04-04 NOTE — Telephone Encounter (Signed)
RX sent, pt advised.   Thanks,   -Mickel Baas

## 2021-04-04 NOTE — Telephone Encounter (Signed)
Ok to send in Paxlovid EUA (30 tabs). Last GFR 73. Needs to hold atorvastatin while taking this medication

## 2021-04-16 ENCOUNTER — Other Ambulatory Visit: Payer: Self-pay

## 2021-04-16 ENCOUNTER — Ambulatory Visit
Admission: RE | Admit: 2021-04-16 | Discharge: 2021-04-16 | Disposition: A | Discharge status: Home / Self Care | Payer: PPO | Source: Ambulatory Visit | Attending: Neurology | Admitting: Neurology

## 2021-04-16 DIAGNOSIS — G8929 Other chronic pain: Secondary | ICD-10-CM | POA: Insufficient documentation

## 2021-04-16 DIAGNOSIS — R519 Headache, unspecified: Secondary | ICD-10-CM | POA: Insufficient documentation

## 2021-04-16 DIAGNOSIS — R200 Anesthesia of skin: Secondary | ICD-10-CM | POA: Diagnosis not present

## 2021-04-25 ENCOUNTER — Other Ambulatory Visit: Payer: Self-pay

## 2021-04-25 DIAGNOSIS — E7800 Pure hypercholesterolemia, unspecified: Secondary | ICD-10-CM

## 2021-04-25 MED ORDER — ATORVASTATIN CALCIUM 10 MG PO TABS: 10.0000 mg | ORAL_TABLET | Freq: Every day | ORAL | 1 refills | Status: DC

## 2021-05-13 DIAGNOSIS — R200 Anesthesia of skin: Secondary | ICD-10-CM | POA: Diagnosis not present

## 2021-05-20 DIAGNOSIS — R200 Anesthesia of skin: Secondary | ICD-10-CM | POA: Diagnosis not present

## 2021-05-30 ENCOUNTER — Telehealth: Payer: Self-pay

## 2021-05-30 NOTE — Progress Notes (Signed)
Left message for patient to call back and schedule Medicare Annual Wellness Visit (AWV).    Please offer to do virtually or by telephone sometime this week    Last AWV:04/11/2020    Please schedule at anytime with [facility] Health Advisor.    If any questions, please contact me at [place], [place], [place], [place] [address] Direct Dial: [phone] [email or web address]: [email or web address]

## 2021-05-31 ENCOUNTER — Ambulatory Visit (INDEPENDENT_AMBULATORY_CARE_PROVIDER_SITE_OTHER): Payer: PPO

## 2021-05-31 DIAGNOSIS — Z13820 Encounter for screening for osteoporosis: Secondary | ICD-10-CM

## 2021-05-31 DIAGNOSIS — Z0000 Encounter for general adult medical examination without abnormal findings: Secondary | ICD-10-CM | POA: Diagnosis not present

## 2021-05-31 NOTE — Progress Notes (Signed)
Subjective:   Kristen Hays is a 75 y.o. female who presents for Medicare Annual (Subsequent) preventive examination. I connected with  Kristen Hays on 05/31/21 by an audio only telemedicine application and verified that I am speaking with the correct person using two identifiers.   I discussed the limitations, risks, security and privacy concerns of performing an evaluation and management service by telephone and the availability of in person appointments. I also discussed with the patient that there may be a patient responsible charge related to this service. The patient expressed understanding and verbally consented to this telephonic visit.  Location of Patient: Home Location of Provider: Office  List any persons and their role that are participating in the visit with the patient.    Review of Systems    Defer to PCP       Objective:    There were no vitals filed for this visit. There is no height or weight on file to calculate BMI.  Advanced Directives 04/11/2020 01/23/2020 01/08/2017 07/21/2016 06/21/2015 05/15/2015  Does Patient Have a Medical Advance Directive? Yes Yes Yes Yes No No  Type of Paramedic of [employer];Living will [place];Living will Living will Living will - -  Does patient want to make changes to medical advance directive? - Yes (Inpatient - patient defers changing a medical advance directive and declines information at this time) - No - Patient declined - -  Copy of [place] in Chart? No - copy requested No - copy requested - No - copy requested - -  Would patient like information on creating a medical advance directive? - No - Patient declined - - - -    Current Medications (verified) Outpatient Encounter Medications as of 05/31/2021  Medication Sig   aspirin 81 MG EC tablet Take 81 mg by mouth daily.   atorvastatin (LIPITOR) 10 MG tablet Take 1 tablet (10 mg total) by mouth daily.    baclofen (LIORESAL) 10 MG tablet Take 1 tablet (10 mg total) by mouth 3 (three) times daily as needed (headaches).   Cholecalciferol (VITAMIN D) 2000 UNITS CAPS Take 1 capsule by mouth daily.   escitalopram (LEXAPRO) 10 MG tablet Take 1 tablet (10 mg total) by mouth daily.   linaclotide (LINZESS) 72 MCG capsule Take 72 mcg by mouth daily before breakfast.    pantoprazole (PROTONIX) 40 MG tablet Take 40 mg by mouth 2 (two) times daily before a meal.    No facility-administered encounter medications on file as of 05/31/2021.    Allergies (verified) Patient has no known allergies.   History: Past Medical History:  Diagnosis Date   Anxiety    Barrett esophagus 2016   Breast cancer ([place])    right breast c Mastectomy    Dysphagia    High cholesterol    Hypothyroidism    Past Surgical History:  Procedure Laterality Date   AUGMENTATION MAMMAPLASTY Bilateral    BREAST SURGERY     mastectomy-right; breast augmentation   CATARACT EXTRACTION W/PHACO Left 07/21/2016   Procedure: CATARACT EXTRACTION PHACO AND INTRAOCULAR LENS PLACEMENT ([place]);  Surgeon: Ronnell Freshwater, MD;  Location: [facility];  Service: Ophthalmology;  Laterality: Left;  LEFT   CESAREAN SECTION     1974/1979   COLONOSCOPY N/A 01/17/2015   Procedure: COLONOSCOPY;  Surgeon: Manya Silvas, MD;  Location: [facility] ENDOSCOPY;  Service: Endoscopy;  Laterality: N/A;   ESOPHAGOGASTRODUODENOSCOPY N/A 01/17/2015   Procedure: ESOPHAGOGASTRODUODENOSCOPY (EGD);  Surgeon:  Manya Silvas, MD;  Location: [facility] ENDOSCOPY;  Service: Endoscopy;  Laterality: N/A;   ESOPHAGOGASTRODUODENOSCOPY (EGD) WITH PROPOFOL N/A 02/17/2018   Procedure: ESOPHAGOGASTRODUODENOSCOPY (EGD) WITH PROPOFOL;  Surgeon: Manya Silvas, MD;  Location: [facility] ENDOSCOPY;  Service: Endoscopy;  Laterality: N/A;   EYE SURGERY Right 08/2014   MASTECTOMY Right 2011   SAVORY DILATION N/A 01/17/2015   Procedure: SAVORY DILATION;  Surgeon: Manya Silvas, MD;   Location: [facility] ENDOSCOPY;  Service: Endoscopy;  Laterality: N/A;   TONSILLECTOMY  2011   Family History  Problem Relation Age of Onset   Diabetes Mother    Stroke Mother    Alzheimer's disease Mother    Bone cancer Father    Vascular Disease Father    Cancer Sister        breast   Breast cancer Sister    Irritable bowel syndrome Sister    Diabetes Sister    Healthy Brother    Healthy Sister    Breast cancer Paternal Aunt    Breast cancer Cousin    Social History   Socioeconomic History   Marital status: Married    Spouse name: Not on file   Number of children: 2   Years of education: HS   Highest education level: High school graduate  Occupational History   Occupation: retired    Fish farm manager: [employer]  Tobacco Use   Smoking status: Never   Smokeless tobacco: Never  Vaping Use   Vaping Use: Never used  Substance and Sexual Activity   Alcohol use: No   Drug use: No   Sexual activity: Yes    Birth control/protection: Post-menopausal  Other Topics Concern   Not on file  Social History Narrative   Not on file   Social Determinants of Health   Financial Resource Strain: Not on file  Food Insecurity: Not on file  Transportation Needs: Not on file  Physical Activity: Not on file  Stress: Not on file  Social Connections: Not on file    Tobacco Counseling Counseling given: Not Answered   Clinical Intake:                 Diabetic?No         Activities of Daily Living In your present state of health, do you have any difficulty performing the following activities: 01/01/2021  Hearing? N  Vision? N  Difficulty concentrating or making decisions? N  Walking or climbing stairs? N  Dressing or bathing? N  Doing errands, shopping? N  Some recent data might be hidden    Patient Care Team: Virginia Crews, MD as PCP - General (Family Medicine) Birder Robson, MD as Referring Physician (Ophthalmology)  Indicate any recent  Medical Services you may have received from other than [facility] providers in the past year (date may be approximate).     Assessment:   This is a routine wellness examination for Kristen Hays.  Hearing/Vision screen No results found.  Dietary issues and exercise activities discussed:     Goals Addressed   None   Depression Screen PHQ 2/9 Scores 01/01/2021 05/25/2020 04/11/2020 02/11/2019 11/10/2018 01/12/2018 10/23/2017  PHQ - 2 Score 0 0 0 0 0 1 1  PHQ- 9 Score 2 2 - 1 - 4 7    Fall Risk Fall Risk  01/01/2021 05/25/2020 04/11/2020 11/10/2018 01/12/2018  Falls in the past year? 0 1 0 1 Yes  Number falls in past yr: 0 1 0 1 2 or more  Comment - 3  tripped - - -  Injury with Fall? 0 0 0 0 No  Risk for fall due to : No Fall Risks - - - -  Follow up Falls evaluation completed Falls evaluation completed - - -    FALL RISK PREVENTION PERTAINING TO THE HOME:  Any stairs in or around the home? Yes  If so, are there any without handrails? No  Home free of loose throw rugs in walkways, pet beds, electrical cords, etc? Yes  Adequate lighting in your home to reduce risk of falls? Yes   ASSISTIVE DEVICES UTILIZED TO PREVENT FALLS:  Life alert? No  Use of a cane, walker or w/c? No  Grab bars in the bathroom? No  Shower chair or bench in shower? No  Elevated toilet seat or a handicapped toilet? No   TIMED UP AND GO:  Was the test performed? No .  Length of time to ambulate 10 feet: n/a sec.    Cognitive Function:     6CIT Screen 02/11/2019  What Year? 0 points  What month? 0 points  What time? 0 points  Count back from 20 0 points  Months in reverse 0 points  Repeat phrase 2 points  Total Score 2    Immunizations Immunization History  Administered Date(s) Administered   Fluad Quad(high Dose 65+) 05/25/2020   Influenza Split 06/15/2012   Influenza, High Dose Seasonal PF 06/06/2015, 06/07/2018, 04/21/2019   PFIZER(Purple Top)SARS-COV-2 Vaccination 10/11/2019, 11/01/2019, 09/08/2020    Pneumococcal Conjugate-13 12/09/2013   Pneumococcal Polysaccharide-23 01/07/2016   Zoster Recombinat (Shingrix) 01/07/2021    TDAP status: Due, Education has been provided regarding the importance of this vaccine. Advised may receive this vaccine at local pharmacy or Health Dept. Aware to provide a copy of the vaccination record if obtained from local pharmacy or Health Dept. Verbalized acceptance and understanding.  Flu Vaccine status: Due, Education has been provided regarding the importance of this vaccine. Advised may receive this vaccine at local pharmacy or Health Dept. Aware to provide a copy of the vaccination record if obtained from local pharmacy or Health Dept. Verbalized acceptance and understanding.  Pneumococcal vaccine status: Up to date  Covid-19 vaccine status: Completed vaccines  Qualifies for Shingles Vaccine? Yes   Zostavax completed No   Shingrix Completed?: Yes  Screening Tests Health Maintenance  Topic Date Due   URINE MICROALBUMIN  Never done   TETANUS/TDAP  Never done   COVID-19 Vaccine (4 - Booster for Pfizer series) 12/01/2020   DEXA SCAN  01/22/2021   INFLUENZA VACCINE  04/01/2021   MAMMOGRAM  03/14/2023   COLONOSCOPY (Pts 45-43yrs Insurance coverage will need to be confirmed)  01/04/2030   Hepatitis C Screening  Completed   Zoster Vaccines- Shingrix  Completed   HPV VACCINES  Aged Out    Health Maintenance  Health Maintenance Due  Topic Date Due   URINE MICROALBUMIN  Never done   TETANUS/TDAP  Never done   COVID-19 Vaccine (4 - Booster for Pfizer series) 12/01/2020   DEXA SCAN  01/22/2021   INFLUENZA VACCINE  04/01/2021    Colorectal cancer screening: No longer required.   Mammogram status: Completed 03/13/2021. Repeat every year  Bone Density status: Ordered 05/31/2021. Pt provided with contact info and advised to call to schedule appt.  Lung Cancer Screening: (Low Dose CT Chest recommended if Age 38-80 years, 30 pack-year currently  smoking OR have quit w/in 15years.) does not qualify.   Lung Cancer Screening Referral: n/a  Additional Screening:  Hepatitis  C Screening: does not qualify; Completed n/a  Vision Screening: Recommended annual ophthalmology exams for early detection of glaucoma and other disorders of the eye. Is the patient up to date with their annual eye exam?  Yes  Who is the provider or what is the name of the office in which the patient attends annual eye exams? Birder Robson, MD If pt is not established with a provider, would they like to be referred to a provider to establish care? No .   Dental Screening: Recommended annual dental exams for proper oral hygiene  Community Resource Referral / Chronic Care Management: CRR required this visit?  No   CCM required this visit?  No      Plan:     I have personally reviewed and noted the following in the patient's chart:   Medical and social history Use of alcohol, tobacco or illicit drugs  Current medications and supplements including opioid prescriptions.  Functional ability and status Nutritional status Physical activity Advanced directives List of other physicians Hospitalizations, surgeries, and ER visits in previous 12 months Vitals Screenings to include cognitive, depression, and falls Referrals and appointments  In addition, I have reviewed and discussed with patient certain preventive protocols, quality metrics, and best practice recommendations. A written personalized care plan for preventive services as well as general preventive health recommendations were provided to patient.     Kavin Leech, [place]   05/31/2021   Nurse Notes: Non face to face time 19 minutes.  Ms. Barsamian , Thank you for taking time to come for your Medicare Wellness Visit. I appreciate your ongoing commitment to your health goals. Please review the following plan we discussed and let me know if I can assist you in the future.   These are the goals we  discussed:  Goals      DIET - INCREASE WATER INTAKE     Recommend to drink at least 6-8 8oz glasses of water per day.        This is a list of the screening recommended for you and due dates:  Health Maintenance  Topic Date Due   Urine Protein Check  Never done   Tetanus Vaccine  Never done   COVID-19 Vaccine (4 - Booster for Pfizer series) 12/01/2020   DEXA scan (bone density measurement)  01/22/2021   Flu Shot  04/01/2021   Mammogram  03/14/2023   Colon Cancer Screening  01/04/2030   Hepatitis C Screening: USPSTF Recommendation to screen - Ages 18-79 yo.  Completed   Zoster (Shingles) Vaccine  Completed   HPV Vaccine  Aged Out

## 2021-06-04 DIAGNOSIS — R519 Headache, unspecified: Secondary | ICD-10-CM | POA: Diagnosis not present

## 2021-06-04 DIAGNOSIS — R202 Paresthesia of skin: Secondary | ICD-10-CM | POA: Diagnosis not present

## 2021-06-04 DIAGNOSIS — G5603 Carpal tunnel syndrome, bilateral upper limbs: Secondary | ICD-10-CM | POA: Diagnosis not present

## 2021-06-07 DIAGNOSIS — G5603 Carpal tunnel syndrome, bilateral upper limbs: Secondary | ICD-10-CM | POA: Diagnosis not present

## 2021-06-26 ENCOUNTER — Ambulatory Visit
Admission: RE | Admit: 2021-06-26 | Discharge: 2021-06-26 | Disposition: A | Discharge status: Home / Self Care | Payer: PPO | Source: Ambulatory Visit | Attending: Family Medicine | Admitting: Family Medicine

## 2021-06-26 ENCOUNTER — Other Ambulatory Visit: Payer: Self-pay

## 2021-06-26 DIAGNOSIS — Z780 Asymptomatic menopausal state: Secondary | ICD-10-CM | POA: Diagnosis not present

## 2021-06-26 DIAGNOSIS — Z13820 Encounter for screening for osteoporosis: Secondary | ICD-10-CM | POA: Diagnosis not present

## 2021-06-26 DIAGNOSIS — M8589 Other specified disorders of bone density and structure, multiple sites: Secondary | ICD-10-CM | POA: Insufficient documentation

## 2021-06-26 DIAGNOSIS — Z853 Personal history of malignant neoplasm of breast: Secondary | ICD-10-CM | POA: Insufficient documentation

## 2021-06-27 ENCOUNTER — Telehealth: Payer: Self-pay | Admitting: Family Medicine

## 2021-06-27 NOTE — Telephone Encounter (Signed)
Pt given lab results per notes of Dr. Brita Romp on 06/27/21. Pt verbalized understanding.   Virginia Crews, MD  06/27/2021  8:03 AM EDT     Bone density scan shows osteopenia (this is some bone loss, but not as bad as osteoporosis).  Recommend regular weight bearing exercise, avoiding smoking, and adequate Ca (1200mg /day) and Vit D (1000 units daily) via diet or supplement.  We will recheck in 2 years to ensure this hasn't worsened. Can consider osteoporosis treatment. Discuss with provider at next visit.

## 2021-06-27 NOTE — Telephone Encounter (Signed)
Pt called back for results.  Unable to contact NT.  Please call back.

## 2021-06-27 NOTE — Telephone Encounter (Signed)
Copied from [place] [id number]. Topic: Quick Communication - Lab Results (Clinic Use ONLY) >> Jun 27, 2021  8:59 AM Dorian Pod, [place] wrote: Called patient to inform them of lab results. When patient returns call, triage nurse may disclose results.

## 2021-07-16 ENCOUNTER — Ambulatory Visit: Payer: Self-pay | Admitting: Family Medicine

## 2021-07-29 DIAGNOSIS — R0683 Snoring: Secondary | ICD-10-CM | POA: Diagnosis not present

## 2021-07-29 DIAGNOSIS — G5603 Carpal tunnel syndrome, bilateral upper limbs: Secondary | ICD-10-CM | POA: Diagnosis not present

## 2021-07-29 DIAGNOSIS — R202 Paresthesia of skin: Secondary | ICD-10-CM | POA: Diagnosis not present

## 2021-07-29 DIAGNOSIS — R2689 Other abnormalities of gait and mobility: Secondary | ICD-10-CM | POA: Diagnosis not present

## 2021-07-29 DIAGNOSIS — R519 Headache, unspecified: Secondary | ICD-10-CM | POA: Diagnosis not present

## 2021-08-08 ENCOUNTER — Ambulatory Visit: Payer: PPO | Attending: Neurology | Admitting: Physical Therapy

## 2021-08-08 ENCOUNTER — Other Ambulatory Visit: Payer: Self-pay

## 2021-08-08 ENCOUNTER — Encounter: Payer: Self-pay | Admitting: Physical Therapy

## 2021-08-08 VITALS — BP 124/57 | HR 76

## 2021-08-08 DIAGNOSIS — R262 Difficulty in walking, not elsewhere classified: Secondary | ICD-10-CM | POA: Insufficient documentation

## 2021-08-08 NOTE — Therapy (Signed)
[facility] [address] [place], [address] Phone: [phone]   Fax:  [phone]  Physical Therapy Evaluation  Patient Details  Name: Kristen Hays MRN: [id number] Date of Birth: Aug 03, 1946 Referring Provider (PT): Dr. Manuella Ghazi   Encounter Date: 08/08/2021   PT End of Session - 08/08/21 1630     Visit Number 1    Number of Visits 1    PT Start Time 7893    PT Stop Time 8101    PT Time Calculation (min) 45 min    Activity Tolerance Patient tolerated treatment well    Behavior During Therapy [facility] for tasks assessed/performed             Past Medical History:  Diagnosis Date   Anxiety    Barrett esophagus 2016   Breast cancer ([place])    right breast c Mastectomy    Dysphagia    High cholesterol    Hypothyroidism     Past Surgical History:  Procedure Laterality Date   AUGMENTATION MAMMAPLASTY Bilateral    BREAST SURGERY     mastectomy-right; breast augmentation   CATARACT EXTRACTION W/PHACO Left 07/21/2016   Procedure: CATARACT EXTRACTION PHACO AND INTRAOCULAR LENS PLACEMENT ([place]);  Surgeon: Ronnell Freshwater, MD;  Location: [facility];  Service: Ophthalmology;  Laterality: Left;  LEFT   CESAREAN SECTION     1974/1979   COLONOSCOPY N/A 01/17/2015   Procedure: COLONOSCOPY;  Surgeon: Manya Silvas, MD;  Location: [facility] ENDOSCOPY;  Service: Endoscopy;  Laterality: N/A;   ESOPHAGOGASTRODUODENOSCOPY N/A 01/17/2015   Procedure: ESOPHAGOGASTRODUODENOSCOPY (EGD);  Surgeon: Manya Silvas, MD;  [facility] ENDOSCOPY;  Service: Endoscopy;  Laterality: N/A;   ESOPHAGOGASTRODUODENOSCOPY (EGD) WITH PROPOFOL N/A 02/17/2018   Procedure: ESOPHAGOGASTRODUODENOSCOPY (EGD) WITH PROPOFOL;  Surgeon: Manya Silvas, MD;  Location[facility] END[facility]COPY;  Service: Endoscopy;  Laterality: N/A;   EYE SURGERY Right 08/2014   MASTECTOMY Right 2011   SAVORY DILATION N/A 01/17/2015   Procedure: SAVORY  DILATION;  Surgeon: Manya Silvas, MD;  Location: [facility] ENDOSCOPY;  Service: Endoscopy;  Laterality: N/A;   TONSILLECTOMY  2011    Vitals:   08/08/21 1158  BP: (!) 124/57  Pulse: 76  SpO2: 98%      Subjective Assessment - 08/08/21 1149     Subjective Pt reports that most of the time when she is walking she goes off to the left and this happens most of the time she is walking and going up stairs. She first noticed this ten to eleven months ago. She reports that the bottom of her feet feel numbness and tingling on bottom of her feet. It is unclear of whether this is neuropathy or not, and it worsens when she lays down at night.    Pertinent History -Discussed benefits of physical therapy for balance issues  - We will order Physical Therapy for balance    Limitations Walking;Other (comment)   Walking up stairs is problematic   How long can you sit comfortably? N/a    How long can you stand comfortably? N/a    How long can you walk comfortably? Can walk whatever distance she just feels off balance    Diagnostic tests --    Patient Stated Goals Wants to be more steady on her feet    Currently in Pain? No/denies    Pain Onset --              BALANCE EVALUATION  [facility] PT Assessment - 08/08/21 0001       Assessment   Medical Diagnosis Imbalance    Referring Provider (PT) Dr. Manuella Ghazi    Onset Date/Surgical Date 08/01/22    Next MD Visit 01/30/2022    Prior [place] residence    Living Arrangements Spouse/significant other    Type of [place] to enter    Entrance Stairs-Number of Steps 3    Entrance Stairs-Rails None    Home Layout One level      Prior Function   Level of Independence Independent      Cognition   Overall Cognitive Status Within Functional Limits for tasks assessed      Balance   Balance Assessed Yes      Functional Gait  Assessment   Gait assessed  Yes    Gait Level  Surface Walks 20 ft in less than 5.5 sec, no assistive devices, good speed, no evidence for imbalance, normal gait pattern, deviates no more than 6 in outside of the 12 in walkway width.    Change in Gait Speed Able to smoothly change walking speed without loss of balance or gait deviation. Deviate no more than 6 in outside of the 12 in walkway width.    Gait with Horizontal Head Turns Performs head turns smoothly with no change in gait. Deviates no more than 6 in outside 12 in walkway width    Gait with Vertical Head Turns Performs head turns with no change in gait. Deviates no more than 6 in outside 12 in walkway width.    Gait and Pivot Turn Pivot turns safely within 3 sec and stops quickly with no loss of balance.    Step Over Obstacle Is able to step over one shoe box (4.5 in total height) but must slow down and adjust steps to clear box safely. May require verbal cueing.    Gait with Narrow Base of Support Ambulates 7-9 steps.    Gait with Eyes Closed Walks 20 ft, uses assistive device, slower speed, mild gait deviations, deviates 6-10 in outside 12 in walkway width. Ambulates 20 ft in less than 9 sec but greater than 7 sec.    Ambulating Backwards Walks 20 ft, uses assistive device, slower speed, mild gait deviations, deviates 6-10 in outside 12 in walkway width.    Steps Two feet to a stair, must use rail.    Total Score 23                Clinical Test of Sensory Interaction for Balance    (CTSIB):  CONDITION TIME STRATEGY SWAY  Eyes open, firm surface 30 seconds ankle   Eyes closed, firm surface 30 seconds ankle   Eyes open, foam surface 30 seconds ankle   Eyes closed, foam surface 30 seconds Hip > ankle    10 Meter Walk Test   Gait Speed:    1st trial 10.31 sec , 2nd trial 9.93 , Avg=10.12   sec - Normal Gait speed Avg=  0.99 m/sec   Fast Gait Speed: 1st trial  7.73 sec, 2nd 8.12 sec, Avg= 7.93  sec -Fast Gait speed time= 1.26  m/sec   > 1 m/sec [employer] and  cross street safely and normal WS <1 m/sec Need Intervention to reduce falls risk      - <0.4 m/sec - household walker - associated with increased frailty, risk  of death, hospitalization or falls;  2-year mortality and morbidity; increased functional impairments, severe  walking disability - 0.8 - 1.3 m/sec - community ambulator - associated with increased independence in self-care        PT Education - 08/08/21 1629     Education Details explanation of outcome measures and significane of cut-off scores    Person(s) Educated Patient    Methods Explanation    Comprehension Verbalized understanding;Returned demonstration;Verbal cues required              PT Short Term Goals - 08/08/21 1634       PT SHORT TERM GOAL #1   Title Patient will demonstrate understanding of home exercise plan to manage symptoms independently    Baseline 12/8: Deferred because pt declined HEP    Time 1    Period Days    Status Deferred               PT Long Term Goals - 08/08/21 1636       PT LONG TERM GOAL #1   Title Patient will achieve FGA score of >=22 to demosntrate that she is not at an increased fall risk.    Baseline 12/8: 23/30    Time 8    Period Weeks    Status Achieved    Target Date 10/03/21                    Plan - 08/08/21 1630     Clinical Impression Statement Pt is a 75 yo female that presents for initial evaluation for imbalance. Based on objective balance measures, pt is not at risk of falling, and her lateral drift while walking was not observed during the session despite this being her chief complaint. Pt does not need additional PT at this moment.    Personal Factors and Comorbidities Comorbidity 3+    Comorbidities HLD, anxiety, depression    Stability/Clinical Decision Making Stable/Uncomplicated    Clinical Decision Making Low    PT Frequency One time visit    PT Next Visit Plan N/a    PT Home Exercise Plan Pt declined offer for HEP     Consulted and Agree with Plan of Care Patient             Patient will benefit from skilled therapeutic intervention in order to improve the following deficits and impairments:     Visit Diagnosis: Difficulty in walking, not elsewhere classified     Problem List Patient Active Problem List   Diagnosis Date Noted   Weight gain 01/01/2021   Major depressive disorder, single episode, moderate ([place]) 01/01/2021   History of reconstruction of right breast 01/13/2020   History of cancer of right breast 01/08/2017   Acquired absence of right breast and nipple 01/08/2017   Tarsal tunnel syndrome of right side 06/21/2015   Anxiety 05/18/2015   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 [place], [address] Phone: [phone]   Fax:  [phone]  Name: Kristen Hays MRN: [id number] Date of Birth: July 27, 1946

## 2021-08-13 ENCOUNTER — Ambulatory Visit: Payer: PPO | Admitting: Physical Therapy

## 2021-08-15 ENCOUNTER — Ambulatory Visit: Payer: PPO | Admitting: Physical Therapy

## 2021-08-20 ENCOUNTER — Encounter: Payer: PPO | Admitting: Physical Therapy

## 2021-08-22 ENCOUNTER — Encounter: Payer: PPO | Admitting: Physical Therapy

## 2021-08-27 ENCOUNTER — Encounter: Payer: PPO | Admitting: Physical Therapy

## 2021-08-29 ENCOUNTER — Encounter: Payer: PPO | Admitting: Physical Therapy

## 2021-09-03 ENCOUNTER — Encounter: Payer: PPO | Admitting: Physical Therapy

## 2021-09-05 ENCOUNTER — Encounter: Payer: PPO | Admitting: Physical Therapy

## 2021-09-10 ENCOUNTER — Encounter: Payer: PPO | Admitting: Physical Therapy

## 2021-09-12 ENCOUNTER — Encounter: Payer: PPO | Admitting: Physical Therapy

## 2021-09-17 DIAGNOSIS — H900 Conductive hearing loss, bilateral: Secondary | ICD-10-CM | POA: Diagnosis not present

## 2021-09-17 DIAGNOSIS — H6123 Impacted cerumen, bilateral: Secondary | ICD-10-CM | POA: Diagnosis not present

## 2021-10-04 DIAGNOSIS — D3132 Benign neoplasm of left choroid: Secondary | ICD-10-CM | POA: Diagnosis not present

## 2021-10-22 ENCOUNTER — Telehealth: Payer: PPO | Admitting: Family Medicine

## 2021-10-23 ENCOUNTER — Other Ambulatory Visit: Payer: Self-pay | Admitting: Family Medicine

## 2021-10-23 DIAGNOSIS — E7800 Pure hypercholesterolemia, unspecified: Secondary | ICD-10-CM

## 2021-11-02 DIAGNOSIS — G4733 Obstructive sleep apnea (adult) (pediatric): Secondary | ICD-10-CM | POA: Diagnosis not present

## 2021-11-05 DIAGNOSIS — K2270 Barrett's esophagus without dysplasia: Secondary | ICD-10-CM | POA: Diagnosis not present

## 2021-11-05 DIAGNOSIS — G4733 Obstructive sleep apnea (adult) (pediatric): Secondary | ICD-10-CM | POA: Diagnosis not present

## 2021-11-05 DIAGNOSIS — K5909 Other constipation: Secondary | ICD-10-CM | POA: Diagnosis not present

## 2021-11-05 DIAGNOSIS — Z86010 Personal history of colonic polyps: Secondary | ICD-10-CM | POA: Diagnosis not present

## 2021-11-05 DIAGNOSIS — R1310 Dysphagia, unspecified: Secondary | ICD-10-CM | POA: Diagnosis not present

## 2021-11-11 DIAGNOSIS — R42 Dizziness and giddiness: Secondary | ICD-10-CM | POA: Diagnosis not present

## 2021-11-11 DIAGNOSIS — G5603 Carpal tunnel syndrome, bilateral upper limbs: Secondary | ICD-10-CM | POA: Diagnosis not present

## 2021-11-11 DIAGNOSIS — R202 Paresthesia of skin: Secondary | ICD-10-CM | POA: Diagnosis not present

## 2021-11-11 DIAGNOSIS — R519 Headache, unspecified: Secondary | ICD-10-CM | POA: Diagnosis not present

## 2021-11-11 DIAGNOSIS — G4733 Obstructive sleep apnea (adult) (pediatric): Secondary | ICD-10-CM | POA: Diagnosis not present

## 2021-11-14 DIAGNOSIS — K317 Polyp of stomach and duodenum: Secondary | ICD-10-CM | POA: Diagnosis not present

## 2021-11-14 DIAGNOSIS — K3189 Other diseases of stomach and duodenum: Secondary | ICD-10-CM | POA: Diagnosis not present

## 2021-11-14 DIAGNOSIS — K2270 Barrett's esophagus without dysplasia: Secondary | ICD-10-CM | POA: Diagnosis not present

## 2021-11-14 DIAGNOSIS — Q402 Other specified congenital malformations of stomach: Secondary | ICD-10-CM | POA: Diagnosis not present

## 2021-11-28 DIAGNOSIS — K219 Gastro-esophageal reflux disease without esophagitis: Secondary | ICD-10-CM | POA: Diagnosis not present

## 2021-11-28 DIAGNOSIS — R001 Bradycardia, unspecified: Secondary | ICD-10-CM | POA: Diagnosis not present

## 2021-11-28 DIAGNOSIS — E7800 Pure hypercholesterolemia, unspecified: Secondary | ICD-10-CM | POA: Diagnosis not present

## 2021-11-28 DIAGNOSIS — R079 Chest pain, unspecified: Secondary | ICD-10-CM | POA: Diagnosis not present

## 2021-11-28 DIAGNOSIS — R0789 Other chest pain: Secondary | ICD-10-CM | POA: Diagnosis not present

## 2021-11-28 DIAGNOSIS — R42 Dizziness and giddiness: Secondary | ICD-10-CM | POA: Diagnosis not present

## 2021-11-28 DIAGNOSIS — R0601 Orthopnea: Secondary | ICD-10-CM | POA: Diagnosis not present

## 2021-11-28 DIAGNOSIS — G4733 Obstructive sleep apnea (adult) (pediatric): Secondary | ICD-10-CM | POA: Diagnosis not present

## 2021-12-05 DIAGNOSIS — G4733 Obstructive sleep apnea (adult) (pediatric): Secondary | ICD-10-CM | POA: Diagnosis not present

## 2021-12-24 DIAGNOSIS — R42 Dizziness and giddiness: Secondary | ICD-10-CM | POA: Diagnosis not present

## 2021-12-24 DIAGNOSIS — R0601 Orthopnea: Secondary | ICD-10-CM | POA: Diagnosis not present

## 2021-12-24 DIAGNOSIS — R001 Bradycardia, unspecified: Secondary | ICD-10-CM | POA: Diagnosis not present

## 2022-01-01 DIAGNOSIS — R0602 Shortness of breath: Secondary | ICD-10-CM | POA: Diagnosis not present

## 2022-01-01 DIAGNOSIS — G4733 Obstructive sleep apnea (adult) (pediatric): Secondary | ICD-10-CM | POA: Diagnosis not present

## 2022-01-01 DIAGNOSIS — I208 Other forms of angina pectoris: Secondary | ICD-10-CM | POA: Diagnosis not present

## 2022-01-16 DIAGNOSIS — R0602 Shortness of breath: Secondary | ICD-10-CM | POA: Diagnosis not present

## 2022-01-22 ENCOUNTER — Other Ambulatory Visit: Payer: Self-pay | Admitting: Family Medicine

## 2022-01-22 DIAGNOSIS — E7800 Pure hypercholesterolemia, unspecified: Secondary | ICD-10-CM

## 2022-01-28 DIAGNOSIS — R42 Dizziness and giddiness: Secondary | ICD-10-CM | POA: Diagnosis not present

## 2022-01-28 DIAGNOSIS — R001 Bradycardia, unspecified: Secondary | ICD-10-CM | POA: Diagnosis not present

## 2022-02-17 ENCOUNTER — Other Ambulatory Visit: Payer: Self-pay | Admitting: Family Medicine

## 2022-02-17 DIAGNOSIS — E7800 Pure hypercholesterolemia, unspecified: Secondary | ICD-10-CM

## 2022-03-10 DIAGNOSIS — G4733 Obstructive sleep apnea (adult) (pediatric): Secondary | ICD-10-CM | POA: Diagnosis not present

## 2022-03-10 DIAGNOSIS — F321 Major depressive disorder, single episode, moderate: Secondary | ICD-10-CM | POA: Diagnosis not present

## 2022-03-25 DIAGNOSIS — R0683 Snoring: Secondary | ICD-10-CM | POA: Diagnosis not present

## 2022-03-25 DIAGNOSIS — F419 Anxiety disorder, unspecified: Secondary | ICD-10-CM | POA: Diagnosis not present

## 2022-03-25 DIAGNOSIS — G4733 Obstructive sleep apnea (adult) (pediatric): Secondary | ICD-10-CM | POA: Diagnosis not present

## 2022-03-25 DIAGNOSIS — F321 Major depressive disorder, single episode, moderate: Secondary | ICD-10-CM | POA: Diagnosis not present

## 2022-03-31 ENCOUNTER — Other Ambulatory Visit: Payer: Self-pay | Admitting: Family Medicine

## 2022-04-02 ENCOUNTER — Ambulatory Visit: Payer: PPO | Admitting: Physician Assistant

## 2022-04-02 NOTE — Progress Notes (Signed)
I,Jamielynn Wigley Robinson,acting as a Education administrator for [employer], PA-C.,have documented all relevant documentation on the behalf of Mardene Speak, PA-C,as directed by  Mardene Speak, PA-C while in the presence of Mardene Speak, PA-C.' Established patient visit   Patient: Kristen Hays   DOB: [birth year]   76 y.o. Female  MRN: [id number] Visit Date: 04/03/2022  Today's healthcare provider: Mardene Speak, PA-C   Chief Complaint  Patient presents with   Breast Pain   Subjective     Patient is in today to be evaluated for right breast pain and feels tight and hard.  Patient has implant in right only. Described as a stinging that lasts a few seconds and feels itchy inside.  Started noticing 2 weeks ago.    Medications: Outpatient Medications Prior to Visit  Medication Sig   aspirin 81 MG EC tablet Take 81 mg by mouth daily.   atorvastatin (LIPITOR) 10 MG tablet TAKE 1 TABLET (10 MG TOTAL) BY MOUTH DAILY. PLEASE SCHEDULE OFFICE VISIT BEFORE ANY FUTURE REFILL.   Cholecalciferol (VITAMIN D) 2000 UNITS CAPS Take 1 capsule by mouth daily.   escitalopram (LEXAPRO) 10 MG tablet Take 1 tablet (10 mg total) by mouth daily.   linaclotide (LINZESS) 72 MCG capsule Take 72 mcg by mouth daily before breakfast.    pantoprazole (PROTONIX) 40 MG tablet Take 40 mg by mouth 2 (two) times daily before a meal.    baclofen (LIORESAL) 10 MG tablet Take 1 tablet (10 mg total) by mouth 3 (three) times daily as needed (headaches).   No facility-administered medications prior to visit.    Review of Systems  All other systems reviewed and are negative. See HPI     Objective    BP 114/74 (BP Location: Left Arm, Patient Position: Sitting, Cuff Size: Normal)   Pulse 66   Temp 98.5 F (36.9 C) (Oral)   Resp 16   Wt 139 lb (63 kg)   SpO2 99%   BMI 23.86 kg/m    Physical Exam Vitals reviewed.  Constitutional:      General: She is not in acute distress.    Appearance: Normal appearance. She is  well-developed. She is not diaphoretic.  HENT:     Head: Normocephalic and atraumatic.     Nose: Nose normal.  Eyes:     General: No scleral icterus.    Conjunctiva/sclera: Conjunctivae normal.  Neck:     Thyroid: No thyromegaly.  Cardiovascular:     Rate and Rhythm: Normal rate and regular rhythm.     Pulses: Normal pulses.     Heart sounds: Normal heart sounds. No murmur heard. Pulmonary:     Effort: Pulmonary effort is normal. No respiratory distress.     Breath sounds: Normal breath sounds. No wheezing, rhonchi or rales.  Chest:  Breasts:    Breasts are asymmetrical (due to R mastectomy, implants, p surgical scarring).  Musculoskeletal:     Cervical back: Neck supple.     Right lower leg: No edema.     Left lower leg: No edema.  Lymphadenopathy:     Cervical: No cervical adenopathy.     Upper Body:     Right upper body: No supraclavicular or axillary adenopathy.     Left upper body: No supraclavicular or axillary adenopathy.  Skin:    General: Skin is warm and dry.     Findings: No rash.  Neurological:     Mental Status: She is alert and oriented to person, place,  and time. Mental status is at baseline.  Psychiatric:        Behavior: Behavior normal.        Thought Content: Thought content normal.        Judgment: Judgment normal.      No results found for any visits on 04/03/22.  Assessment & Plan      1. History of cancer of right breast  - MM Digital Screening Unilat L; Future - US BREAST LTD UNI RIGHT INC AXILLA; Future  2. Breast pain  - MM Digital Screening Unilat L; Future - US BREAST LTD UNI RIGHT INC AXILLA; Future   FU as needed  The patient was advised to call back or seek an in-person evaluation if the symptoms worsen or if the condition fails to improve as anticipated.  I discussed the assessment and treatment plan with the patient. The patient was provided an opportunity to ask questions and all were answered. The patient agreed with the  plan and demonstrated an understanding of the instructions.  The entirety of the information documented in the History of Present Illness, Review of Systems and Physical Exam were personally obtained by me. Portions of this information were initially documented by the CMA and reviewed by me for thoroughness and accuracy.  Portions of this note were created using dictation software and may contain typographical errors.     Mardene Speak, PA-C  [facility] [phone] (phone) [phone] (fax)  [place]

## 2022-04-03 ENCOUNTER — Ambulatory Visit (INDEPENDENT_AMBULATORY_CARE_PROVIDER_SITE_OTHER): Payer: PPO | Admitting: Physician Assistant

## 2022-04-03 ENCOUNTER — Encounter: Payer: Self-pay | Admitting: Physician Assistant

## 2022-04-03 VITALS — BP 114/74 | HR 66 | Temp 98.5°F | Resp 16 | Wt 139.0 lb

## 2022-04-03 DIAGNOSIS — Z853 Personal history of malignant neoplasm of breast: Secondary | ICD-10-CM

## 2022-04-03 DIAGNOSIS — N644 Mastodynia: Secondary | ICD-10-CM

## 2022-04-07 ENCOUNTER — Other Ambulatory Visit: Payer: Self-pay | Admitting: Physician Assistant

## 2022-04-07 ENCOUNTER — Telehealth: Payer: Self-pay

## 2022-04-07 DIAGNOSIS — Z1231 Encounter for screening mammogram for malignant neoplasm of breast: Secondary | ICD-10-CM

## 2022-04-07 NOTE — Telephone Encounter (Signed)
Copied from [place] [phone] [id number]. Topic: General - Other >> Apr 07, 2022 10:01 AM Burman Freestone wrote: Reason for CRM: Per [place] mammogram order will need to be changed to [place]

## 2022-04-14 ENCOUNTER — Ambulatory Visit
Admission: RE | Admit: 2022-04-14 | Discharge: 2022-04-14 | Disposition: A | Discharge status: Home / Self Care | Payer: PPO | Source: Ambulatory Visit | Attending: Physician Assistant | Admitting: Physician Assistant

## 2022-04-14 DIAGNOSIS — Z9882 Breast implant status: Secondary | ICD-10-CM | POA: Insufficient documentation

## 2022-04-14 DIAGNOSIS — N644 Mastodynia: Secondary | ICD-10-CM | POA: Diagnosis not present

## 2022-04-14 DIAGNOSIS — Z1231 Encounter for screening mammogram for malignant neoplasm of breast: Secondary | ICD-10-CM

## 2022-04-14 DIAGNOSIS — Z853 Personal history of malignant neoplasm of breast: Secondary | ICD-10-CM | POA: Insufficient documentation

## 2022-04-15 NOTE — Progress Notes (Signed)
Hello Kristen Hays ,   Your screening mammogram results are back and negative.   Any questions please reach out to the office or message me on MyChart!  Best, Mardene Speak, PA-C

## 2022-04-15 NOTE — Progress Notes (Signed)
Hello Kristen Hays ,   Your US breast results are within normal limits.  This is reassuring.  Any questions please reach out to the office or message me on MyChart!  Best, Mardene Speak, PA-C

## 2022-04-24 ENCOUNTER — Ambulatory Visit: Payer: PPO | Admitting: Physician Assistant

## 2022-05-06 IMAGING — MG DIGITAL SCREENING UNILAT LEFT IMPLANT  W/ TOMO W/ CAD
8 series · 9 of 20 positions shown · non-contrast
Comparison: Previous exam(s).

CLINICAL DATA: Screening.

EXAM:
DIGITAL SCREENING UNILATERAL LEFT MAMMOGRAM WITH IMPLANTS, CAD AND
TOMOSYNTHESIS
TECHNIQUE: Left screening digital craniocaudal and mediolateral oblique
mammograms were obtained. Left screening digital breast
tomosynthesis was performed. The images were evaluated with
computer-aided detection. Standard and/or implant displaced views
were performed.

[L CC]
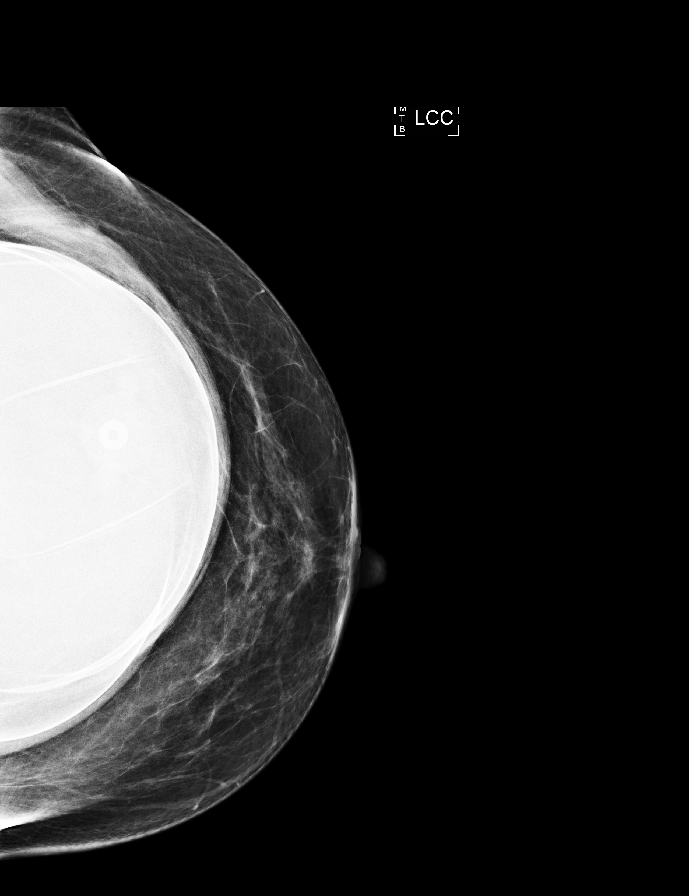

[L MLO]
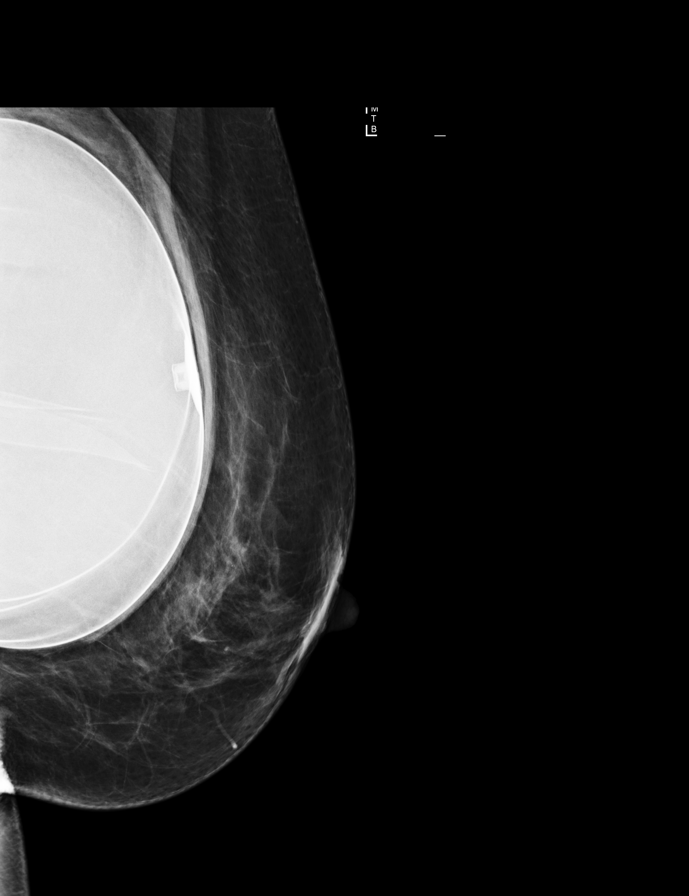

[L MLO synth-2D (1 of 2)]
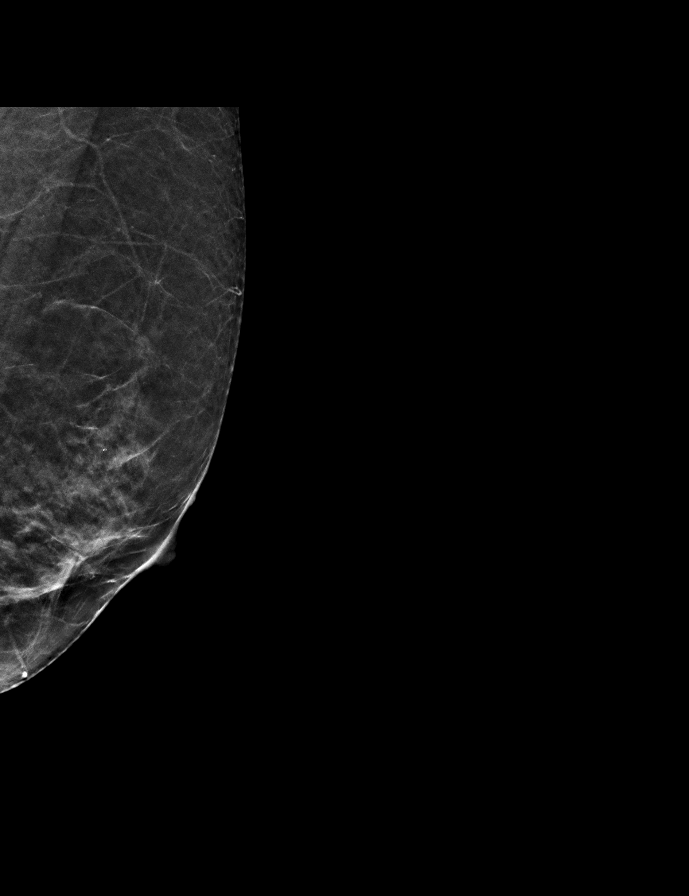

[L MLO synth-2D (2 of 2)]
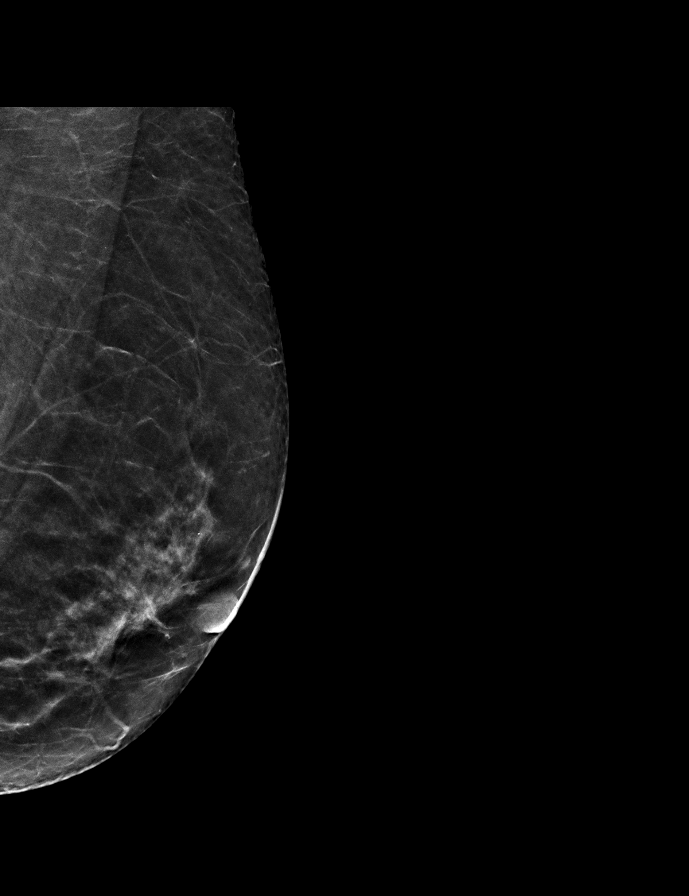

[L CC synth-2D]
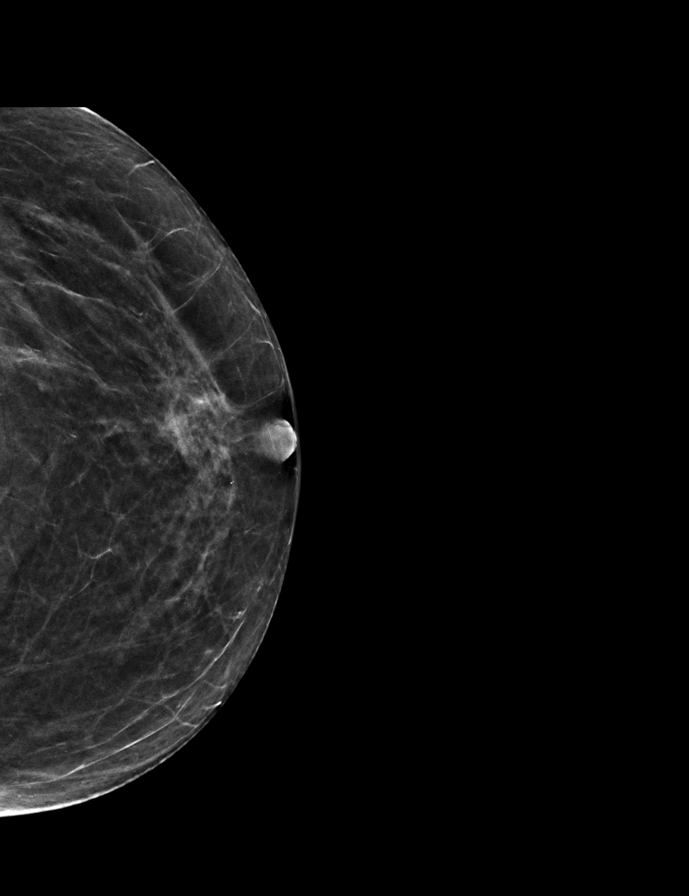

[L CCID BREAST TOMOSYNTHESIS IMAGE tomo · 2 of 44 frames shown]
[frame 15/44]
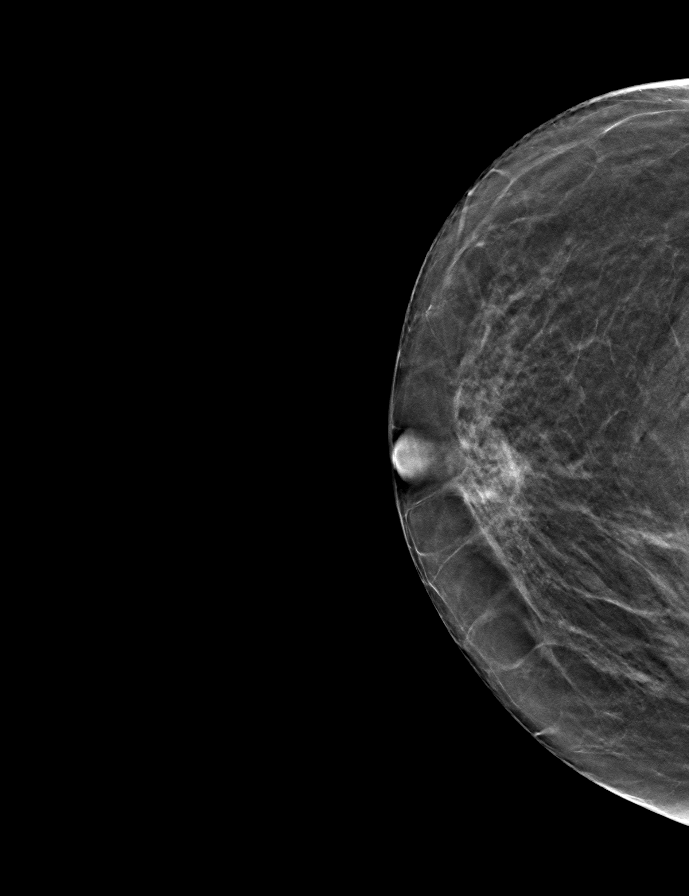
[frame 23/44]
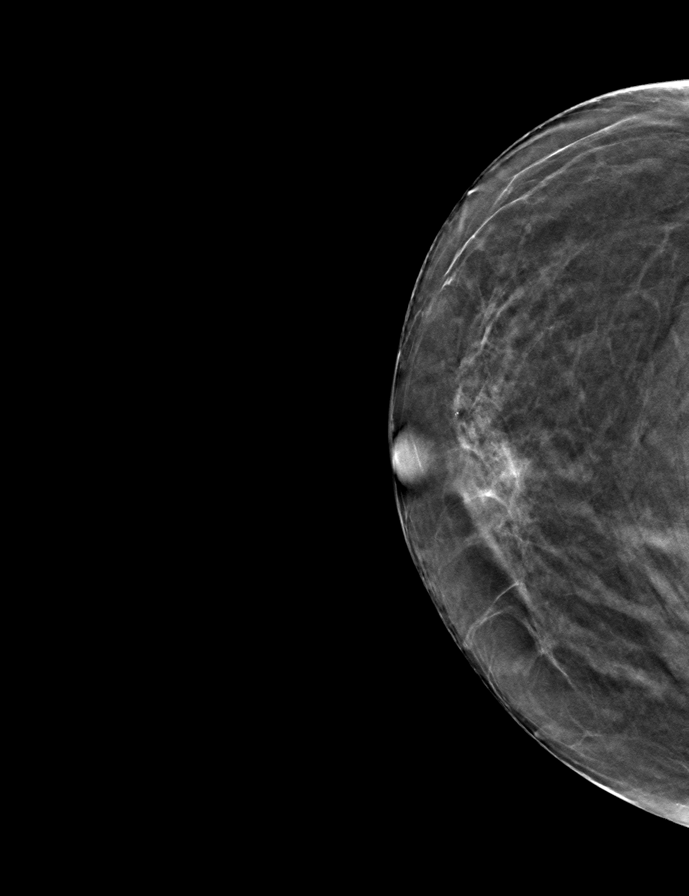

[L MLOID BREAST TOMOSYNTHESIS IMAGE tomo (1 of 2) · tomo slice 23/46.0]
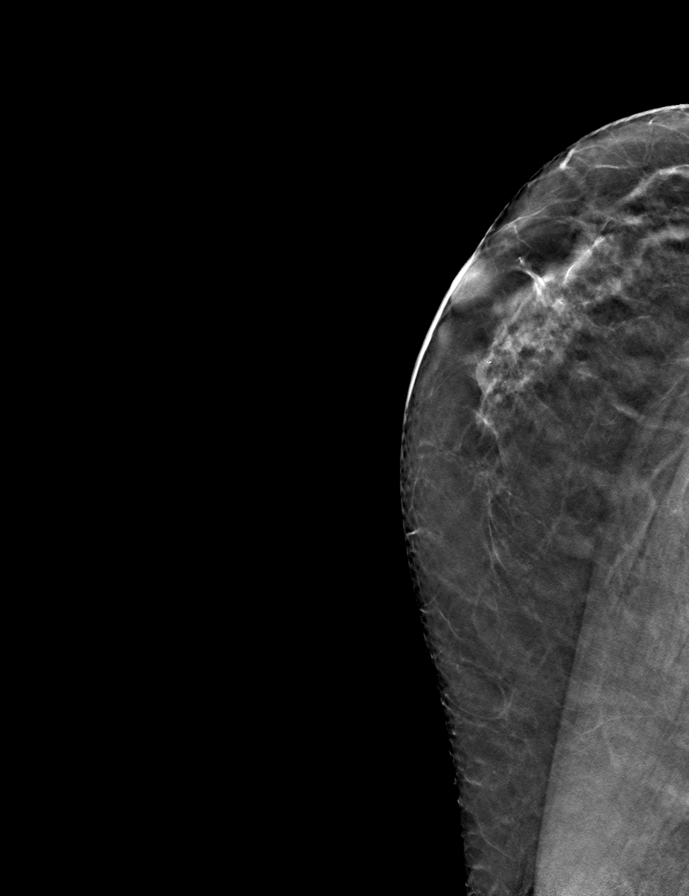

[L MLOID BREAST TOMOSYNTHESIS IMAGE tomo (2 of 2) · tomo slice 21/40.0]
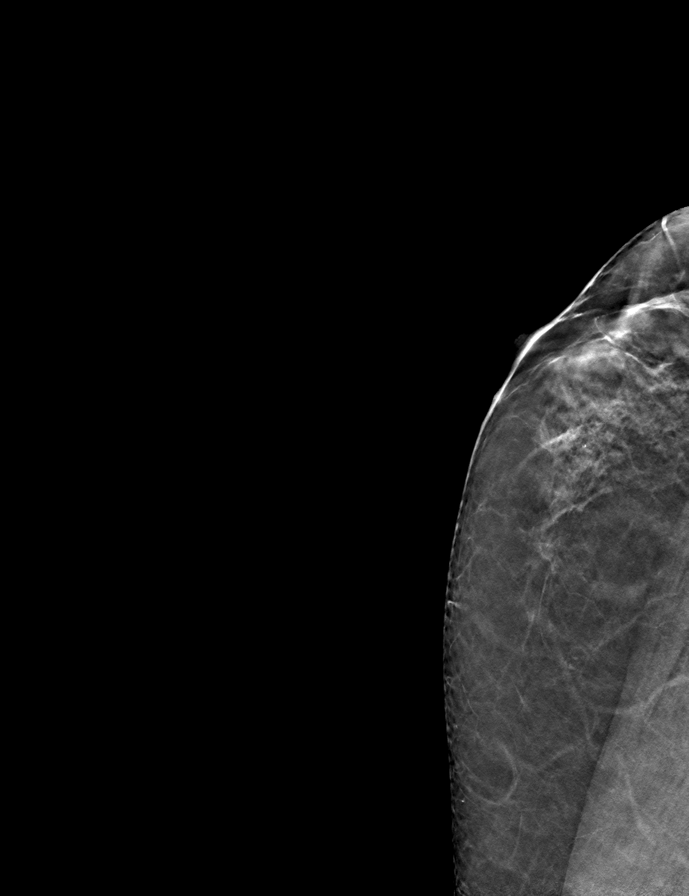

[9 of 20 positions shown; findings below may reference images not displayed]

ACR Breast Density Category b: There are scattered areas of
fibroglandular density.
FINDINGS: The patient has a retropectoral implant. There are no findings
suspicious for malignancy.
IMPRESSION: No mammographic evidence of malignancy. A result letter of this
screening mammogram will be mailed directly to the patient.

RECOMMENDATION:
Screening mammogram in one year. (Code:1M-E-1WU)

BI-RADS CATEGORY  1:  Negative.

## 2022-05-21 ENCOUNTER — Telehealth: Payer: Self-pay | Admitting: Family Medicine

## 2022-05-21 NOTE — Telephone Encounter (Signed)
Copied from [place] [id number]. Topic: Medicare AWV >> May 21, 2022 11:11 AM Jae Dire wrote: Reason for CRM:  Left message for patient to call back and schedule Medicare Annual Wellness Visit (AWV) in office.   If unable to come into the office for AWV,  please offer to do virtually or by telephone.  Last AWV:  05/31/2021  Please schedule at anytime with [facility] Advisor.  30 minute appointment for Virtual or phone 45 minute appointment for in office or Initial virtual/phone  Any questions, please contact me at [phone]

## 2022-05-28 DIAGNOSIS — F321 Major depressive disorder, single episode, moderate: Secondary | ICD-10-CM | POA: Diagnosis not present

## 2022-05-28 DIAGNOSIS — G4733 Obstructive sleep apnea (adult) (pediatric): Secondary | ICD-10-CM | POA: Diagnosis not present

## 2022-06-02 ENCOUNTER — Ambulatory Visit (INDEPENDENT_AMBULATORY_CARE_PROVIDER_SITE_OTHER): Payer: PPO

## 2022-06-02 VITALS — Wt 139.0 lb

## 2022-06-02 DIAGNOSIS — S32009A Unspecified fracture of unspecified lumbar vertebra, initial encounter for closed fracture: Secondary | ICD-10-CM | POA: Insufficient documentation

## 2022-06-02 DIAGNOSIS — Z0000 Encounter for general adult medical examination without abnormal findings: Secondary | ICD-10-CM

## 2022-06-02 DIAGNOSIS — M5136 Other intervertebral disc degeneration, lumbar region: Secondary | ICD-10-CM | POA: Insufficient documentation

## 2022-06-02 NOTE — Progress Notes (Signed)
Virtual Visit via Telephone Note  I connected with  Kristen Hays on 06/02/22 at  9:15 AM EDT by telephone and verified that I am speaking with the correct person using two identifiers.  Location: Patient: home Provider: [facility] Persons participating in the virtual visit: [employer]   I discussed the limitations, risks, security and privacy concerns of performing an evaluation and management service by telephone and the availability of in person appointments. The patient expressed understanding and agreed to proceed.  Interactive audio and video telecommunications were attempted between this nurse and patient, however failed, due to patient having technical difficulties OR patient did not have access to video capability.  We continued and completed visit with audio only.  Some vital signs may be absent or patient reported.   Dionisio David, LPN  Subjective:   Kristen Hays is a 76 y.o. female who presents for Medicare Annual (Subsequent) preventive examination.  Review of Systems     Cardiac Risk Factors include: advanced age (>39mn, >>40women);dyslipidemia     Objective:    Today's Vitals   06/02/22 0927  Weight: 139 lb (63 kg)   Body mass index is 23.86 kg/m.     06/02/2022    9:18 AM 08/08/2021   11:48 AM 05/31/2021    3:17 PM 04/11/2020   11:45 AM 01/23/2020   11:18 AM 01/08/2017    9:02 AM 07/21/2016    6:53 AM  Advanced Directives  Does Patient Have a Medical Advance Directive? No Yes Yes Yes Yes Yes Yes  Type of Advance Directive   [place] Living will Living will  Does patient want to make changes to medical advance directive?  No - Patient declined No - Patient declined  Yes (Inpatient - patient defers changing a medical advance directive and declines information at this time)  No - Patient declined  Copy of [place] Chart?    No - copy  requested No - copy requested  No - copy requested  Would patient like information on creating a medical advance directive? No - Patient declined    No - Patient declined      Current Medications (verified) Outpatient Encounter Medications as of 06/02/2022  Medication Sig   atorvastatin (LIPITOR) 10 MG tablet TAKE 1 TABLET (10 MG TOTAL) BY MOUTH DAILY. PLEASE SCHEDULE OFFICE VISIT BEFORE ANY FUTURE REFILL.   baclofen (LIORESAL) 10 MG tablet    escitalopram (LEXAPRO) 10 MG tablet Take 1 tablet (10 mg total) by mouth daily.   escitalopram (LEXAPRO) 5 MG tablet Take by mouth.   gabapentin (NEURONTIN) 600 MG tablet Take 300 mg by mouth 2 (two) times daily.   pantoprazole (PROTONIX) 40 MG tablet Take 40 mg by mouth 2 (two) times daily before a meal.    Cholecalciferol (VITAMIN D) 2000 UNITS CAPS Take 1 capsule by mouth daily. (Patient not taking: Reported on 06/02/2022)   [DISCONTINUED] aspirin 81 MG EC tablet Take 81 mg by mouth daily.   [DISCONTINUED] linaclotide (LINZESS) 72 MCG capsule Take 72 mcg by mouth daily before breakfast.    No facility-administered encounter medications on file as of 06/02/2022.    Allergies (verified) Patient has no known allergies.   History: Past Medical History:  Diagnosis Date   Anxiety    [place]rett esophagus 2016   Breast cancer ([place]    right breast c Mastectomy    Dysphagia    High  cholesterol    Hypothyroidism    Past Surgical History:  Procedure Laterality Date   AUGMENTATION MAMMAPLASTY Bilateral    BREAST SURGERY     mastectomy-right; breast augmentation   CATARACT EXTRACTION W/PHACO Left 07/21/2016   Procedure: CATARACT EXTRACTION PHACO AND INTRAOCULAR LENS PLACEMENT ([place]);  Surgeon: Ronnell Freshwater, MD;  Location: [place] [facility];  Service: Ophthalmology;  Laterality: Left;  LEFT   CESAREAN SECTION     1974/1979   COLONOSCOPY N/A 01/17/2015   Procedure: COLONOSCOPY;  Surgeon: Manya Silvas, MD;  Location: [facility] ENDOSCOPY;   Service: Endoscopy;  Laterality: N/A;   ESOPHAGOGASTRODUODENOSCOPY N/A 01/17/2015   Procedure: ESOPHAGOGASTRODUODENOSCOPY (EGD);  Surgeon: Manya Silvas, MD;  Location: [facility] ENDOSCOPY;  Service: Endoscopy;  Laterality: N/A;   ESOPHAGOGASTRODUODENOSCOPY (EGD) WITH PROPOFOL N/A 02/17/2018   Procedure: ESOPHAGOGASTRODUODENOSCOPY (EGD) WITH PROPOFOL;  Surgeon: Manya Silvas, MD;  Location[facility]COPY;  Service: Endoscopy;  Laterality: N/A;   EYE SURGERY Right 08/2014   MASTECTOMY Right 2011   SAVORY DILATION N/A 01/17/2015   Procedure: SAVORY DILATION;  Surgeon: Manya Silvas, MD;  Location: [facility] ENDOSCOPY;  Service: Endoscopy;  Laterality: N/A;   TONSILLECTOMY  2011   Family History  Problem Relation Age of Onset   Diabetes Mother    Stroke Mother    Alzheimer's disease Mother    Bone cancer Father    Vascular Disease Father    Cancer Sister        breast   Breast cancer Sister    Irritable bowel syndrome Sister    Diabetes Sister    Healthy Brother    Healthy Sister    Breast cancer Paternal Aunt    Breast cancer Cousin    Social History   Socioeconomic History   Marital status: Married    Spouse name: Not on file   Number of children: 2   Years of education: HS   Highest education level: High school graduate  Occupational History   Occupation: retired    Fish farm manager: [employer]  Tobacco Use   Smoking status: Never   Smokeless tobacco: Never  Vaping Use   Vaping Use: Never used  Substance and Sexual Activity   Alcohol use: No   Drug use: No   Sexual activity: Yes    Birth control/protection: Post-menopausal  Other Topics Concern   Not on file  Social History Narrative   Not on file   Social Determinants of Health   Financial Resource Strain: Low Risk  (06/02/2022)   Overall Financial Resource Strain (CARDIA)    Difficulty of Paying Living Expenses: Not hard at all  Food Insecurity: No Food Insecurity (06/02/2022)   Hunger Vital Sign     Worried About Running Out of Food in the Last Year: Never true    Ran Out of Food in the Last Year: Never true  Transportation Needs: No Transportation Needs (06/02/2022)   PRAPARE - Hydrologist (Medical): No    Lack of Transportation (Non-Medical): No  Physical Activity: Sufficiently Active (06/02/2022)   Exercise Vital Sign    Days of Exercise per Week: 4 days    Minutes of Exercise per Session: 40 min  Stress: No Stress Concern Present (06/02/2022)   [place]    Feeling of Stress : Not at all  Social Connections: Moderately Isolated (06/02/2022)   Social Connection and Isolation Panel [NHANES]    Frequency of Communication with Friends  and Family: Twice a week    Frequency of Social Gatherings with Friends and Family: Once a week    Attends Religious Services: Never    Marine scientist or Organizations: No    Attends Music therapist: Never    Marital Status: Married    Tobacco Counseling Counseling given: Not Answered   Clinical Intake:  Pre-visit preparation completed: Yes  Pain : No/denies pain     Nutritional Risks: None Diabetes: No  How often do you need to have someone help you when you read instructions, pamphlets, or other written materials from your doctor or pharmacy?: 1 - Never  Diabetic?no  Interpreter Needed?: No  Information entered by :: Kirke Shaggy, LPN   Activities of Daily Living    06/02/2022    9:19 AM 05/29/2022    1:56 PM  In your present state of health, do you have any difficulty performing the following activities:  Hearing? 0 0  Vision? 0 0  Difficulty concentrating or making decisions? 0 0  Walking or climbing stairs? 0 0  Dressing or bathing? 0 0  Doing errands, shopping? 0 0  Preparing Food and eating ? N N  Using the Toilet? N N  In the past six months, have you accidently leaked urine? Y Y  Do you have problems  with loss of bowel control? N N  Managing your Medications? N N  Managing your Finances? N N  Housekeeping or managing your Housekeeping? N N    Patient Care Team: Virginia Crews, MD as PCP - General (Family Medicine) Birder Robson, MD as Referring Physician (Ophthalmology)  Indicate any recent Medical Services you may have received from other than [facility] providers in the past year (date may be approximate).     Assessment:   This is a routine wellness examination for Kristen Hays.  Hearing/Vision screen Hearing Screening - Comments:: No aids Vision Screening - Comments:: Readers- [facility]  Dietary issues and exercise activities discussed: Current Exercise Habits: Home exercise routine, Type of exercise: walking, Time (Minutes): 40, Frequency (Times/Week): 4, Weekly Exercise (Minutes/Week): 160, Intensity: Mild   Goals Addressed             This Visit's Progress    DIET - EAT MORE FRUITS AND VEGETABLES         Depression Screen    06/02/2022    9:17 AM 05/31/2021    3:17 PM 01/01/2021   10:49 AM 05/25/2020    8:09 AM 04/11/2020   11:42 AM 02/11/2019   11:07 AM 11/10/2018    9:47 AM  PHQ 2/9 Scores  PHQ - 2 Score 0 0 0 0 0 0 0  PHQ- 9 Score 0  '2 2  1     '$ Fall Risk    06/02/2022    9:19 AM 05/29/2022    1:56 PM 05/31/2021    3:17 PM 01/01/2021   10:50 AM 05/25/2020    8:10 AM  Fall Risk   Falls in the past year? 0 0 0 0 1  Number falls in past yr: 0 0 0 0 1  Comment     3 tripped  Injury with Fall? 0 0 0 0 0  Risk for fall due to : No Fall Risks  No Fall Risks No Fall Risks   Follow up Falls prevention discussed;Falls evaluation completed  Falls evaluation completed Falls evaluation completed Falls evaluation completed    FALL RISK PREVENTION PERTAINING TO THE HOME:  Any stairs  in or around the home? Yes  If so, are there any without handrails? No  Home free of loose throw rugs in walkways, pet beds, electrical cords, etc? Yes  Adequate lighting in your  home to reduce risk of falls? Yes   ASSISTIVE DEVICES UTILIZED TO PREVENT FALLS:  Life alert? No  Use of a cane, walker or w/c? No  Grab bars in the bathroom? No  Shower chair or bench in shower? No  Elevated toilet seat or a handicapped toilet? No    Cognitive Function:        06/02/2022    9:20 AM 05/31/2021    3:19 PM 02/11/2019   11:24 AM  6CIT Screen  What Year? 0 points 0 points 0 points  What month? 0 points 0 points 0 points  What time? 0 points 0 points 0 points  Count back from 20 0 points 0 points 0 points  Months in reverse 0 points 0 points 0 points  Repeat phrase 0 points 0 points 2 points  Total Score 0 points 0 points 2 points    Immunizations Immunization History  Administered Date(s) Administered   Fluad Quad(high Dose 65+) 05/25/2020   Influenza Split 06/15/2012   Influenza, High Dose Seasonal PF 06/06/2015, 06/07/2018, 04/21/2019, 04/21/2019, 06/23/2021   PFIZER(Purple Top)SARS-COV-2 Vaccination 10/11/2019, 11/01/2019, 09/08/2020   Pneumococcal Conjugate-13 12/09/2013   Pneumococcal Polysaccharide-23 01/07/2016   Zoster Recombinat (Shingrix) 01/07/2021, 04/17/2021    TDAP status: Due, Education has been provided regarding the importance of this vaccine. Advised may receive this vaccine at local pharmacy or Health Dept. Aware to provide a copy of the vaccination record if obtained from local pharmacy or Health Dept. Verbalized acceptance and understanding.  Flu Vaccine status: Up to date  Pneumococcal vaccine status: Up to date  Covid-19 vaccine status: Completed vaccines  Qualifies for Shingles Vaccine? Yes   Zostavax completed No   Shingrix Completed?: Yes  Screening Tests Health Maintenance  Topic Date Due   TETANUS/TDAP  Never done   COVID-19 Vaccine (4 - Pfizer risk series) 11/03/2020   INFLUENZA VACCINE  04/01/2022   DEXA SCAN  06/26/2026   COLONOSCOPY (Pts 45-15yr Insurance coverage will need to be confirmed)  01/04/2030   Pneumonia  Vaccine 75 Years old  Completed   Hepatitis C Screening  Completed   Zoster Vaccines- Shingrix  Completed   HPV VACCINES  Aged Out    Health Maintenance  Health Maintenance Due  Topic Date Due   TETANUS/TDAP  Never done   COVID-19 Vaccine (4 - Pfizer risk series) 11/03/2020   INFLUENZA VACCINE  04/01/2022    Colorectal cancer screening: Type of screening: Colonoscopy. Completed 01/05/20. Repeat every 10 years- aged out  Mammogram status: Completed 04/14/22. Repeat every year- aged out  Bone Density status: Completed 06/26/21. Results reflect: Bone density results: OSTEOPENIA. Repeat every 5 years.  Lung Cancer Screening: (Low Dose CT Chest recommended if Age 75-80years, 30 pack-year currently smoking OR have quit w/in 15years.) does not qualify.   Additional Screening:  Hepatitis C Screening: does qualify; Completed no  Vision Screening: Recommended annual ophthalmology exams for early detection of glaucoma and other disorders of the eye. Is the patient up to date with their annual eye exam?  Yes  Who is the provider or what is the name of the office in which the patient attends annual eye exams? [place] is not established with a provider, would they like to be referred to a provider to establish  care? No .   Dental Screening: Recommended annual dental exams for proper oral hygiene  Community Resource Referral / Chronic Care Management: CRR required this visit?  No   CCM required this visit?  No      Plan:     I have personally reviewed and noted the following in the patient's chart:   Medical and social history Use of alcohol, tobacco or illicit drugs  Current medications and supplements including opioid prescriptions. Patient is not currently taking opioid prescriptions. Functional ability and status Nutritional status Physical activity Advanced directives List of other physicians Hospitalizations, surgeries, and ER visits in previous 12  months Vitals Screenings to include cognitive, depression, and falls Referrals and appointments  In addition, I have reviewed and discussed with patient certain preventive protocols, quality metrics, and best practice recommendations. A written personalized care plan for preventive services as well as general preventive health recommendations were provided to patient.     Dionisio David, LPN   [id number]   Nurse Notes: none

## 2022-06-02 NOTE — Patient Instructions (Signed)
Kristen Hays , Thank you for taking time to come for your Medicare Wellness Visit. I appreciate your ongoing commitment to your health goals. Please review the following plan we discussed and let me know if I can assist you in the future.   Screening recommendations/referrals: Colonoscopy: 01/05/20 Mammogram: 04/14/22 Bone Density: 01/05/20 Recommended yearly ophthalmology/optometry visit for glaucoma screening and checkup Recommended yearly dental visit for hygiene and checkup  Vaccinations: Influenza vaccine: had at [facility] 3 weeks ago per pt Pneumococcal vaccine: 01/07/16 Tdap vaccine: n/d Shingles vaccine: Shingrix 01/07/21, 04/17/21   Covid-19:10/11/19, 11/01/19, 09/08/20  Advanced directives: no  Conditions/risks identified: none  Next appointment: Follow up in one year for your annual wellness visit 06/04/23 @ 9:15 am by phone   Preventive Care 65 Years and Older, Female Preventive care refers to lifestyle choices and visits with your health care provider that can promote health and wellness. What does preventive care include? A yearly physical exam. This is also called an annual well check. Dental exams once or twice a year. Routine eye exams. Ask your health care provider how often you should have your eyes checked. Personal lifestyle choices, including: Daily care of your teeth and gums. Regular physical activity. Eating a healthy diet. Avoiding tobacco and drug use. Limiting alcohol use. Practicing safe sex. Taking low-dose aspirin every day. Taking vitamin and mineral supplements as recommended by your health care provider. What happens during an annual well check? The services and screenings done by your health care provider during your annual well check will depend on your age, overall health, lifestyle risk factors, and family history of disease. Counseling  Your health care provider may ask you questions about your: Alcohol use. Tobacco use. Drug use. Emotional  well-being. Home and relationship well-being. Sexual activity. Eating habits. History of falls. Memory and ability to understand (cognition). Work and work Statistician. Reproductive health. Screening  You may have the following tests or measurements: Height, weight, and BMI. Blood pressure. Lipid and cholesterol levels. These may be checked every 5 years, or more frequently if you are over 3 years old. Skin check. Lung cancer screening. You may have this screening every year starting at age 26 if you have a 30-pack-year history of smoking and currently smoke or have quit within the past 15 years. Fecal occult blood test (FOBT) of the stool. You may have this test every year starting at age 69. Flexible sigmoidoscopy or colonoscopy. You may have a sigmoidoscopy every 5 years or a colonoscopy every 10 years starting at age 33. Hepatitis C blood test. Hepatitis B blood test. Sexually transmitted disease (STD) testing. Diabetes screening. This is done by checking your blood sugar (glucose) after you have not eaten for a while (fasting). You may have this done every 1-3 years. Bone density scan. This is done to screen for osteoporosis. You may have this done starting at age 19. Mammogram. This may be done every 1-2 years. Talk to your health care provider about how often you should have regular mammograms. Talk with your health care provider about your test results, treatment options, and if necessary, the need for more tests. Vaccines  Your health care provider may recommend certain vaccines, such as: Influenza vaccine. This is recommended every year. Tetanus, diphtheria, and acellular pertussis (Tdap, Td) vaccine. You may need a Td booster every 10 years. Zoster vaccine. You may need this after age 34. Pneumococcal 13-valent conjugate (PCV13) vaccine. One dose is recommended after age 5. Pneumococcal polysaccharide (PPSV23) vaccine. One dose is  recommended after age 34. Talk to your  health care provider about which screenings and vaccines you need and how often you need them. This information is not intended to replace advice given to you by your health care provider. Make sure you discuss any questions you have with your health care provider. Document Released: 09/14/2015 Document Revised: 05/07/2016 Document Reviewed: 06/19/2015 Elsevier Interactive Patient Education  2017 [place] Prevention in the Home Falls can cause injuries. They can happen to people of all ages. There are many things you can do to make your home safe and to help prevent falls. What can I do on the outside of my home? Regularly fix the edges of walkways and driveways and fix any cracks. Remove anything that might make you trip as you walk through a door, such as a raised step or threshold. Trim any bushes or trees on the path to your home. Use bright outdoor lighting. Clear any walking paths of anything that might make someone trip, such as rocks or tools. Regularly check to see if handrails are loose or broken. Make sure that both sides of any steps have handrails. Any raised decks and porches should have guardrails on the edges. Have any leaves, snow, or ice cleared regularly. Use sand or salt on walking paths during winter. Clean up any spills in your garage right away. This includes oil or grease spills. What can I do in the bathroom? Use night lights. Install grab bars by the toilet and in the tub and shower. Do not use towel bars as grab bars. Use non-skid mats or decals in the tub or shower. If you need to sit down in the shower, use a plastic, non-slip stool. Keep the floor dry. Clean up any water that spills on the floor as soon as it happens. Remove soap buildup in the tub or shower regularly. Attach bath mats securely with double-sided non-slip rug tape. Do not have throw rugs and other things on the floor that can make you trip. What can I do in the bedroom? Use night  lights. Make sure that you have a light by your bed that is easy to reach. Do not use any sheets or blankets that are too big for your bed. They should not hang down onto the floor. Have a firm chair that has side arms. You can use this for support while you get dressed. Do not have throw rugs and other things on the floor that can make you trip. What can I do in the kitchen? Clean up any spills right away. Avoid walking on wet floors. Keep items that you use a lot in easy-to-reach places. If you need to reach something above you, use a strong step stool that has a grab bar. Keep electrical cords out of the way. Do not use floor polish or wax that makes floors slippery. If you must use wax, use non-skid floor wax. Do not have throw rugs and other things on the floor that can make you trip. What can I do with my stairs? Do not leave any items on the stairs. Make sure that there are handrails on both sides of the stairs and use them. Fix handrails that are broken or loose. Make sure that handrails are as long as the stairways. Check any carpeting to make sure that it is firmly attached to the stairs. Fix any carpet that is loose or worn. Avoid having throw rugs at the top or bottom of the stairs. If  you do have throw rugs, attach them to the floor with carpet tape. Make sure that you have a light switch at the top of the stairs and the bottom of the stairs. If you do not have them, ask someone to add them for you. What else can I do to help prevent falls? Wear shoes that: Do not have high heels. Have rubber bottoms. Are comfortable and fit you well. Are closed at the toe. Do not wear sandals. If you use a stepladder: Make sure that it is fully opened. Do not climb a closed stepladder. Make sure that both sides of the stepladder are locked into place. Ask someone to hold it for you, if possible. Clearly mark and make sure that you can see: Any grab bars or handrails. First and last  steps. Where the edge of each step is. Use tools that help you move around (mobility aids) if they are needed. These include: Canes. Walkers. Scooters. Crutches. Turn on the lights when you go into a dark area. Replace any light bulbs as soon as they burn out. Set up your furniture so you have a clear path. Avoid moving your furniture around. If any of your floors are uneven, fix them. If there are any pets around you, be aware of where they are. Review your medicines with your doctor. Some medicines can make you feel dizzy. This can increase your chance of falling. Ask your doctor what other things that you can do to help prevent falls. This information is not intended to replace advice given to you by your health care provider. Make sure you discuss any questions you have with your health care provider. Document Released: 06/14/2009 Document Revised: 01/24/2016 Document Reviewed: 09/22/2014 Elsevier Interactive Patient Education  2017 [employer].

## 2022-06-12 ENCOUNTER — Encounter: Payer: Self-pay | Admitting: Family Medicine

## 2022-06-12 ENCOUNTER — Ambulatory Visit (INDEPENDENT_AMBULATORY_CARE_PROVIDER_SITE_OTHER): Payer: PPO | Admitting: Family Medicine

## 2022-06-12 VITALS — BP 135/74 | HR 59 | Temp 97.6°F | Resp 16 | Wt 140.7 lb

## 2022-06-12 DIAGNOSIS — M67449 Ganglion, unspecified hand: Secondary | ICD-10-CM | POA: Diagnosis not present

## 2022-06-12 HISTORY — DX: Ganglion, unspecified hand: M67.449

## 2022-06-12 NOTE — Assessment & Plan Note (Signed)
Characteristics appear to be consistent with cystic lesion on patient's right index finger This is been present for years and now is enlarging and is nontender and does not affect patient's range of motion or functionality She would like to have this removed We will place referral for hand surgery for further evaluation

## 2022-06-12 NOTE — Progress Notes (Signed)
I,Joseline E Rosas,acting as a scribe for [employer], MD.,have documented all relevant documentation on the behalf of Eulis Foster, MD,as directed by  Eulis Foster, MD while in the presence of Eulis Foster, MD.   Established patient visit   Patient: Kristen Hays   DOB: [birth year]   76 y.o. Female  MRN: [id number] Visit Date: 06/12/2022  Today's healthcare provider: Eulis Foster, MD      Subjective    HPI  Patient here with concern of knot/lump on right index finger. Reports has been there for a long time, years.  She has been evaluated for this in the past and referred to dermatology, she did not schedule with dermatology  She denies pain or decreased ROM of the finger or hand due to this nodule  She states that the nodule has increased in size She denies any weakness in the right hand She denies previous nodules or cystic lesions like this before She is concerned that she is developing additional similar nodules on her finger She denies any draining, bleeding from the area    Medications: Outpatient Medications Prior to Visit  Medication Sig   atorvastatin (LIPITOR) 10 MG tablet TAKE 1 TABLET (10 MG TOTAL) BY MOUTH DAILY. PLEASE SCHEDULE OFFICE VISIT BEFORE ANY FUTURE REFILL.   baclofen (LIORESAL) 10 MG tablet    escitalopram (LEXAPRO) 10 MG tablet Take 1 tablet (10 mg total) by mouth daily.   escitalopram (LEXAPRO) 5 MG tablet Take by mouth.   pantoprazole (PROTONIX) 40 MG tablet Take 40 mg by mouth 2 (two) times daily before a meal.    [DISCONTINUED] Cholecalciferol (VITAMIN D) 2000 UNITS CAPS Take 1 capsule by mouth daily.   [DISCONTINUED] gabapentin (NEURONTIN) 600 MG tablet Take 300 mg by mouth 2 (two) times daily.   No facility-administered medications prior to visit.    Review of Systems     Objective    BP 135/74 (BP Location: Left Arm, Patient Position: Sitting, Cuff Size: Normal)   Pulse (!)  59   Temp 97.6 F (36.4 C) (Oral)   Resp 16   Wt 140 lb 11.2 oz (63.8 kg)   BMI 24.15 kg/m    Physical Exam Musculoskeletal:     Right hand: Swelling present. No tenderness or bony tenderness. Normal range of motion. Normal strength. Normal sensation. Normal capillary refill. Normal pulse.     Left hand: Normal.       Arms:     Comments: Nodule is 1 cm in total diameter, soft, mobile, appears to have sinus tracking that is developing        No results found for any visits on 06/12/22.  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 The documentation on 06/12/22 for the exam, diagnosis, procedures, and orders are all accurate and complete.   Eulis Foster, MD  [facility]  [phone] (phone) [phone] (fax)  [place]

## 2022-07-04 DIAGNOSIS — F321 Major depressive disorder, single episode, moderate: Secondary | ICD-10-CM | POA: Diagnosis not present

## 2022-07-04 DIAGNOSIS — G4733 Obstructive sleep apnea (adult) (pediatric): Secondary | ICD-10-CM | POA: Diagnosis not present

## 2022-07-08 DIAGNOSIS — R2231 Localized swelling, mass and lump, right upper limb: Secondary | ICD-10-CM | POA: Diagnosis not present

## 2022-07-13 ENCOUNTER — Other Ambulatory Visit: Payer: Self-pay | Admitting: Family Medicine

## 2022-07-13 DIAGNOSIS — E7800 Pure hypercholesterolemia, unspecified: Secondary | ICD-10-CM

## 2022-07-14 NOTE — Telephone Encounter (Signed)
Please schedule for fu or CPE when patient calls back.

## 2022-07-15 ENCOUNTER — Encounter: Payer: Self-pay | Admitting: Family Medicine

## 2022-07-15 ENCOUNTER — Ambulatory Visit (INDEPENDENT_AMBULATORY_CARE_PROVIDER_SITE_OTHER): Payer: PPO | Admitting: Family Medicine

## 2022-07-15 VITALS — BP 123/61 | HR 59 | Temp 98.0°F | Resp 16 | Ht 61.0 in | Wt 139.5 lb

## 2022-07-15 DIAGNOSIS — E7800 Pure hypercholesterolemia, unspecified: Secondary | ICD-10-CM

## 2022-07-15 DIAGNOSIS — F321 Major depressive disorder, single episode, moderate: Secondary | ICD-10-CM | POA: Diagnosis not present

## 2022-07-15 DIAGNOSIS — R2231 Localized swelling, mass and lump, right upper limb: Secondary | ICD-10-CM | POA: Diagnosis not present

## 2022-07-15 DIAGNOSIS — E782 Mixed hyperlipidemia: Secondary | ICD-10-CM

## 2022-07-15 MED ORDER — ATORVASTATIN CALCIUM 10 MG PO TABS: 10.0000 mg | ORAL_TABLET | Freq: Every day | ORAL | 3 refills | Status: DC

## 2022-07-15 NOTE — Progress Notes (Signed)
I,Sulibeya S Dimas,acting as a Education administrator for Lavon Paganini, MD.,have documented all relevant documentation on the behalf of Lavon Paganini, MD,as directed by  Lavon Paganini, MD while in the presence of Lavon Paganini, MD.   Established patient visit   Patient: Kristen Hays   DOB: Sep 18, 1945   76 y.o. Female  MRN: [id number] Visit Date: 07/15/2022  Today's healthcare provider: Lavon Paganini, MD   Chief Complaint  Patient presents with   Depression   Hyperlipidemia   Subjective    HPI  Lipid/Cholesterol, Follow-up  Last lipid panel Other pertinent labs  Lab Results  Component Value Date   CHOL 149 01/01/2021   HDL 43 01/01/2021   LDLCALC 89 01/01/2021   TRIG 88 01/01/2021   CHOLHDL 3.5 01/01/2021   Lab Results  Component Value Date   ALT 16 01/01/2021   AST 13 01/01/2021   PLT 244 05/25/2020   TSH 0.644 01/01/2021     She was last seen for this 6 months ago.  Management since that visit includes no changes.  She reports excellent compliance with treatment. She is not having side effects.   Symptoms: No chest pain No chest pressure/discomfort  No dyspnea No lower extremity edema  No numbness or tingling of extremity No orthopnea  No palpitations No paroxysmal nocturnal dyspnea  No speech difficulty No syncope   Current diet: in general, a "healthy" diet   Current exercise: walking  The 10-year ASCVD risk score (Arnett DK, et al., 2019) is: 14.5%  --------------------------------------------------------------------------------------------------- Depression, Follow-up  She  was last seen for this 6 months ago. Changes made at last visit include no changes.   She reports excellent compliance with treatment. She is not having side effects.   She reports excellent tolerance of treatment. Current symptoms include:  none She feels she is Improved since last visit.     07/15/2022    9:54 AM 06/02/2022    9:17 AM 05/31/2021    3:17 PM   Depression screen PHQ 2/9  Decreased Interest 0 0 0  Down, Depressed, Hopeless 0 0 0  PHQ - 2 Score 0 0 0  Altered sleeping 0 0   Tired, decreased energy 0 0   Change in appetite 1 0   Feeling bad or failure about yourself  0 0   Trouble concentrating 0 0   Moving slowly or fidgety/restless 0 0   Suicidal thoughts 0 0   PHQ-9 Score 1 0   Difficult doing work/chores Not difficult at all Not difficult at all     -----------------------------------------------------------------------------------------   Medications: Outpatient Medications Prior to Visit  Medication Sig   baclofen (LIORESAL) 10 MG tablet    escitalopram (LEXAPRO) 10 MG tablet Take 1 tablet (10 mg total) by mouth daily.   escitalopram (LEXAPRO) 5 MG tablet Take by mouth.   pantoprazole (PROTONIX) 40 MG tablet Take 40 mg by mouth 2 (two) times daily before a meal.    [DISCONTINUED] atorvastatin (LIPITOR) 10 MG tablet TAKE 1 TABLET (10 MG TOTAL) BY MOUTH DAILY. PLEASE SCHEDULE OFFICE VISIT BEFORE ANY FUTURE REFILL.   No facility-administered medications prior to visit.    Review of Systems  Constitutional:  Negative for appetite change and fatigue.  Eyes:  Negative for visual disturbance.  Respiratory:  Negative for cough, chest tightness and shortness of breath.   Cardiovascular:  Negative for chest pain, palpitations and leg swelling.  Gastrointestinal:  Negative for abdominal pain, diarrhea, nausea and vomiting.  Objective    BP 123/61 (BP Location: Left Arm, Patient Position: Sitting, Cuff Size: Normal)   Pulse (!) 59   Temp 98 F (36.7 C) (Oral)   Resp 16   Ht '5\' 1"'$  (1.549 m)   Wt 139 lb 8 oz (63.3 kg)   BMI 26.36 kg/m  BP Readings from Last 3 Encounters:  07/15/22 123/61  06/12/22 135/74  04/03/22 114/74   Wt Readings from Last 3 Encounters:  07/15/22 139 lb 8 oz (63.3 kg)  06/12/22 140 lb 11.2 oz (63.8 kg)  06/02/22 139 lb (63 kg)      Physical Exam Vitals reviewed.   Constitutional:      General: She is not in acute distress.    Appearance: Normal appearance. She is well-developed. She is not diaphoretic.  HENT:     Head: Normocephalic and atraumatic.  Eyes:     General: No scleral icterus.    Conjunctiva/sclera: Conjunctivae normal.  Neck:     Thyroid: No thyromegaly.  Cardiovascular:     Rate and Rhythm: Normal rate and regular rhythm.     Pulses: Normal pulses.     Heart sounds: Normal heart sounds. No murmur heard. Pulmonary:     Effort: Pulmonary effort is normal. No respiratory distress.     Breath sounds: Normal breath sounds. No wheezing, rhonchi or rales.  Musculoskeletal:     Cervical back: Neck supple.     Right lower leg: No edema.     Left lower leg: No edema.  Lymphadenopathy:     Cervical: No cervical adenopathy.  Skin:    General: Skin is warm and dry.     Findings: No rash.  Neurological:     Mental Status: She is alert and oriented to person, place, and time. Mental status is at baseline.  Psychiatric:        Mood and Affect: Mood normal.        Behavior: Behavior normal.       No results found for any visits on 07/15/22.  Assessment & Plan     Problem List Items Addressed This Visit       Other   Hypercholesterolemia    Previously well controlled Continue statin Repeat FLP and CMP Goal LDL < 130      Relevant Medications   atorvastatin (LIPITOR) 10 MG tablet   Major depressive disorder, single episode, moderate (HCC) - Primary    Chronic and well controlled Continue lexapro 15 mg daily      Hyperlipidemia   Relevant Medications   atorvastatin (LIPITOR) 10 MG tablet   Other Relevant Orders   Comprehensive metabolic panel   Lipid panel     Return in about 1 year (around 07/16/2023) for CPE.      I, Lavon Paganini, MD, have reviewed all documentation for this visit. The documentation on 07/15/22 for the exam, diagnosis, procedures, and orders are all accurate and complete.   Sunaina Ferrando,  Dionne Bucy, MD, MPH [facility]

## 2022-07-15 NOTE — Assessment & Plan Note (Signed)
Previously well controlled Continue statin Repeat FLP and CMP Goal LDL < 130

## 2022-07-15 NOTE — Assessment & Plan Note (Signed)
Chronic and well controlled Continue lexapro 15 mg daily

## 2022-07-16 LAB — LIPID PANEL
Chol/HDL Ratio: 4.4 ratio (ref 0.0–4.4)
Cholesterol, Total: 201 mg/dL — ABNORMAL HIGH (ref 100–199)
HDL: 46 mg/dL (ref >39)
LDL Chol Calc (NIH): 133 mg/dL — ABNORMAL HIGH (ref 0–99)
Triglycerides: 123 mg/dL (ref 0–149)
VLDL Cholesterol Cal: 22 mg/dL (ref 5–40)

## 2022-07-16 LAB — COMPREHENSIVE METABOLIC PANEL
ALT: 28 IU/L (ref 0–32)
AST: 14 IU/L (ref 0–40)
Albumin/Globulin Ratio: 2 (ref 1.2–2.2)
Albumin: 4.1 g/dL (ref 3.8–4.8)
Alkaline Phosphatase: 72 IU/L (ref 44–121)
BUN/Creatinine Ratio: 16 (ref 12–28)
BUN: 14 mg/dL (ref 8–27)
Bilirubin Total: 0.6 mg/dL (ref 0.0–1.2)
CO2: 27 mmol/L (ref 20–29)
Calcium: 9.4 mg/dL (ref 8.7–10.3)
Chloride: 103 mmol/L (ref 96–106)
Creatinine, Ser: 0.88 mg/dL (ref 0.57–1.00)
Globulin, Total: 2.1 g/dL (ref 1.5–4.5)
Glucose: 98 mg/dL (ref 70–99)
Potassium: 4.2 mmol/L (ref 3.5–5.2)
Sodium: 141 mmol/L (ref 134–144)
Total Protein: 6.2 g/dL (ref 6.0–8.5)
eGFR: 68 mL/min/{1.73_m2} (ref >59)

## 2022-07-29 DIAGNOSIS — R2231 Localized swelling, mass and lump, right upper limb: Secondary | ICD-10-CM | POA: Diagnosis not present

## 2022-09-22 DIAGNOSIS — G4733 Obstructive sleep apnea (adult) (pediatric): Secondary | ICD-10-CM | POA: Diagnosis not present

## 2022-09-22 DIAGNOSIS — F321 Major depressive disorder, single episode, moderate: Secondary | ICD-10-CM | POA: Diagnosis not present

## 2022-10-14 DIAGNOSIS — R2231 Localized swelling, mass and lump, right upper limb: Secondary | ICD-10-CM | POA: Diagnosis not present

## 2022-10-16 DIAGNOSIS — H02403 Unspecified ptosis of bilateral eyelids: Secondary | ICD-10-CM | POA: Diagnosis not present

## 2022-10-16 DIAGNOSIS — D3132 Benign neoplasm of left choroid: Secondary | ICD-10-CM | POA: Diagnosis not present

## 2022-10-16 DIAGNOSIS — G44219 Episodic tension-type headache, not intractable: Secondary | ICD-10-CM | POA: Diagnosis not present

## 2022-12-08 NOTE — Progress Notes (Unsigned)
I,Branda Chaudhary S Darrall Strey,acting as a Neurosurgeon for Shirlee Latch, MD.,have documented all relevant documentation on the behalf of Shirlee Latch, MD,as directed by  Shirlee Latch, MD while in the presence of Shirlee Latch, MD.     Established patient visit   Patient: Kristen Hays   DOB: 03-31-1946   77 y.o. Female  MRN: [id number] Visit Date: 12/09/2022  Today's healthcare provider: Shirlee Latch, MD   Chief Complaint  Patient presents with   Hyperlipidemia   Subjective    HPI  Lipid/Cholesterol, Follow-up  Last lipid panel Other pertinent labs  Lab Results  Component Value Date   CHOL 201 (H) 07/15/2022   HDL 46 07/15/2022   LDLCALC 133 (H) 07/15/2022   TRIG 123 07/15/2022   CHOLHDL 4.4 07/15/2022   Lab Results  Component Value Date   ALT 28 07/15/2022   AST 14 07/15/2022   PLT 244 05/25/2020   TSH 0.644 01/01/2021     She was last seen for this 5 months ago.  Management since that visit includes recommended to increase atorvastatin to 20 mg. Patient requested to continue same dose, work on diet and exercise.   She reports good compliance with treatment. Patient reports eating healthier.  She is not having side effects.   Current diet: in general, a "healthy" diet   Current exercise:  stretching  The 10-year ASCVD risk score (Arnett DK, et al., 2019) is: 15.6%  ---------------------------------------------------------------------------------------------------  Has episodes of anxiety - feels like she has to get out of the grocery store quickly and feels stressed.     12/09/2022    9:34 AM 07/15/2022    9:54 AM 06/02/2022    9:17 AM 05/31/2021    3:17 PM 01/01/2021   10:49 AM  Depression screen PHQ 2/9  Decreased Interest 0 0 0 0 0  Down, Depressed, Hopeless 1 0 0 0 0  PHQ - 2 Score 1 0 0 0 0  Altered sleeping 0 0 0  2  Tired, decreased energy 0 0 0  0  Change in appetite 1 1 0  0  Feeling bad or failure about yourself  2 0 0  0  Trouble  concentrating 0 0 0  0  Moving slowly or fidgety/restless 0 0 0  0  Suicidal thoughts 0 0 0  0  PHQ-9 Score 4 1 0  2  Difficult doing work/chores Not difficult at all Not difficult at all Not difficult at all  Not difficult at all   Overactive bladder -  longstanding urinary frequency and urgency. Occasional stress and urge UI   Medications: Outpatient Medications Prior to Visit  Medication Sig   atorvastatin (LIPITOR) 10 MG tablet Take 1 tablet (10 mg total) by mouth daily.   pantoprazole (PROTONIX) 40 MG tablet Take 40 mg by mouth 2 (two) times daily before a meal.    [DISCONTINUED] escitalopram (LEXAPRO) 10 MG tablet Take 1 tablet (10 mg total) by mouth daily.   [DISCONTINUED] escitalopram (LEXAPRO) 5 MG tablet Take by mouth.   [DISCONTINUED] baclofen (LIORESAL) 10 MG tablet  (Patient not taking: Reported on 12/09/2022)   No facility-administered medications prior to visit.    Review of Systems     Objective    BP 126/79 (BP Location: Left Arm, Patient Position: Sitting, Cuff Size: Large)   Pulse 63   Temp 97.6 F (36.4 C) (Temporal)   Resp 12   Wt 138 lb 9.6 oz (62.9 kg)   SpO2 96%   BMI  26.19 kg/m  BP Readings from Last 3 Encounters:  12/09/22 126/79  07/15/22 123/61  06/12/22 135/74   Wt Readings from Last 3 Encounters:  12/09/22 138 lb 9.6 oz (62.9 kg)  07/15/22 139 lb 8 oz (63.3 kg)  06/12/22 140 lb 11.2 oz (63.8 kg)      Physical Exam Vitals reviewed.  Constitutional:      General: She is not in acute distress.    Appearance: Normal appearance. She is well-developed. She is not diaphoretic.  HENT:     Head: Normocephalic and atraumatic.  Eyes:     General: No scleral icterus.    Conjunctiva/sclera: Conjunctivae normal.  Neck:     Thyroid: No thyromegaly.  Cardiovascular:     Rate and Rhythm: Normal rate and regular rhythm.     Heart sounds: Normal heart sounds. No murmur heard. Pulmonary:     Effort: Pulmonary effort is normal. No respiratory  distress.     Breath sounds: Normal breath sounds. No wheezing, rhonchi or rales.  Abdominal:     General: There is no distension.     Palpations: Abdomen is soft.     Tenderness: There is no abdominal tenderness.  Musculoskeletal:     Cervical back: Neck supple.     Right lower leg: No edema.     Left lower leg: No edema.  Lymphadenopathy:     Cervical: No cervical adenopathy.  Skin:    General: Skin is warm and dry.     Findings: No rash.  Neurological:     Mental Status: She is alert and oriented to person, place, and time. Mental status is at baseline.  Psychiatric:        Mood and Affect: Mood normal.        Behavior: Behavior normal.       No results found for any visits on 12/09/22.  Assessment & Plan     Problem List Items Addressed This Visit       Genitourinary   Overactive bladder    Longstanding, but newly diagnosed problem No concern for UTI, given longstadning symptoms No meds at this time, given other changes we are making, but can consider Oxybutinin vs Myrbetriq at next visit        Other   Anxiety    Chronic and uncontrolled Offered adding buspar or changing SSRI - lexapro to Zoloft Will Start Zoloft 100 mg daily in place of Lexapro 15mg  daily  Discussed potential side effects Discussed that it can take 6-8 weeks to reach full efficacy Contracted for safety - no SI/HI Discussed synergistic effects of medications and therapy       Relevant Medications   sertraline (ZOLOFT) 100 MG tablet   Hypercholesterolemia - Primary    Previously elevated Continue statin - consider dose titration Repeat FLP and CMP Goal LDL < 130       Relevant Orders   Comprehensive metabolic panel   Lipid panel   Major depressive disorder, single episode, moderate    Well controlled Switching from lexapro to zoloft as above to improve anxiety control      Relevant Medications   sertraline (ZOLOFT) 100 MG tablet     Return in about 2 months (around 02/08/2023)  for MDD/GAD f/u, virtual ok.      I, Shirlee Latch, MD, have reviewed all documentation for this visit. The documentation on 12/09/22 for the exam, diagnosis, procedures, and orders are all accurate and complete.   Erasmo Downer, MD, MPH [facility]

## 2022-12-09 ENCOUNTER — Ambulatory Visit: Payer: PPO | Admitting: Family Medicine

## 2022-12-09 ENCOUNTER — Ambulatory Visit (INDEPENDENT_AMBULATORY_CARE_PROVIDER_SITE_OTHER): Payer: HMO | Admitting: Family Medicine

## 2022-12-09 ENCOUNTER — Encounter: Payer: Self-pay | Admitting: Family Medicine

## 2022-12-09 VITALS — BP 126/79 | HR 63 | Temp 97.6°F | Resp 12 | Wt 138.6 lb

## 2022-12-09 DIAGNOSIS — N3281 Overactive bladder: Secondary | ICD-10-CM | POA: Insufficient documentation

## 2022-12-09 DIAGNOSIS — F419 Anxiety disorder, unspecified: Secondary | ICD-10-CM | POA: Diagnosis not present

## 2022-12-09 DIAGNOSIS — F321 Major depressive disorder, single episode, moderate: Secondary | ICD-10-CM | POA: Diagnosis not present

## 2022-12-09 DIAGNOSIS — E7800 Pure hypercholesterolemia, unspecified: Secondary | ICD-10-CM | POA: Diagnosis not present

## 2022-12-09 MED ORDER — SERTRALINE HCL 100 MG PO TABS: 100.0000 mg | ORAL_TABLET | Freq: Every day | ORAL | 3 refills | Status: DC

## 2022-12-09 NOTE — Assessment & Plan Note (Signed)
Longstanding, but newly diagnosed problem No concern for UTI, given longstadning symptoms No meds at this time, given other changes we are making, but can consider Oxybutinin vs Myrbetriq at next visit

## 2022-12-09 NOTE — Assessment & Plan Note (Signed)
Well controlled Switching from lexapro to zoloft as above to improve anxiety control

## 2022-12-09 NOTE — Assessment & Plan Note (Signed)
Previously elevated Continue statin - consider dose titration Repeat FLP and CMP Goal LDL < 130

## 2022-12-09 NOTE — Assessment & Plan Note (Signed)
Chronic and uncontrolled Offered adding buspar or changing SSRI - lexapro to Zoloft Will Start Zoloft 100 mg daily in place of Lexapro 15mg  daily  Discussed potential side effects Discussed that it can take 6-8 weeks to reach full efficacy Contracted for safety - no SI/HI Discussed synergistic effects of medications and therapy

## 2022-12-10 LAB — LIPID PANEL
Chol/HDL Ratio: 3.1 ratio (ref 0.0–4.4)
Cholesterol, Total: 162 mg/dL (ref 100–199)
HDL: 52 mg/dL (ref >39)
LDL Chol Calc (NIH): 91 mg/dL (ref 0–99)
Triglycerides: 105 mg/dL (ref 0–149)
VLDL Cholesterol Cal: 19 mg/dL (ref 5–40)

## 2022-12-10 LAB — COMPREHENSIVE METABOLIC PANEL
ALT: 22 IU/L (ref 0–32)
AST: 14 IU/L (ref 0–40)
Albumin/Globulin Ratio: 2.2 (ref 1.2–2.2)
Albumin: 4.2 g/dL (ref 3.8–4.8)
Alkaline Phosphatase: 66 IU/L (ref 44–121)
BUN/Creatinine Ratio: 17 (ref 12–28)
BUN: 12 mg/dL (ref 8–27)
Bilirubin Total: 0.7 mg/dL (ref 0.0–1.2)
CO2: 23 mmol/L (ref 20–29)
Calcium: 9.2 mg/dL (ref 8.7–10.3)
Chloride: 104 mmol/L (ref 96–106)
Creatinine, Ser: 0.72 mg/dL (ref 0.57–1.00)
Globulin, Total: 1.9 g/dL (ref 1.5–4.5)
Glucose: 98 mg/dL (ref 70–99)
Potassium: 4.4 mmol/L (ref 3.5–5.2)
Sodium: 142 mmol/L (ref 134–144)
Total Protein: 6.1 g/dL (ref 6.0–8.5)
eGFR: 87 mL/min/{1.73_m2} (ref >59)

## 2022-12-22 DIAGNOSIS — G4733 Obstructive sleep apnea (adult) (pediatric): Secondary | ICD-10-CM | POA: Diagnosis not present

## 2022-12-22 DIAGNOSIS — F321 Major depressive disorder, single episode, moderate: Secondary | ICD-10-CM | POA: Diagnosis not present

## 2022-12-31 ENCOUNTER — Other Ambulatory Visit: Payer: Self-pay | Admitting: Family Medicine

## 2023-01-01 NOTE — Telephone Encounter (Signed)
Unable to refill per protocol, Rx request was refilled 12/09/22 for 30 and 2 refills.  Requested Prescriptions  Pending Prescriptions Disp Refills   sertraline (ZOLOFT) 100 MG tablet [Pharmacy Med Name: SERTRALINE HCL 100 MG TABLET] 90 tablet 2    Sig: TAKE 1 TABLET BY MOUTH EVERY DAY     Psychiatry:  Antidepressants - SSRI - sertraline Passed - 12/31/2022  2:30 PM      Passed - AST in normal range and within 360 days    AST  Date Value Ref Range Status  12/09/2022 14 0 - 40 IU/L Final         Passed - ALT in normal range and within 360 days    ALT  Date Value Ref Range Status  12/09/2022 22 0 - 32 IU/L Final         Passed - Completed PHQ-2 or PHQ-9 in the last 360 days      Passed - Valid encounter within last 6 months    Recent Outpatient Visits           3 weeks ago Hypercholesterolemia   [place] [facility] [place], Marzella Schlein, MD   5 months ago Major depressive disorder, single episode, moderate [facility])   [place] [facility] [place], Marzella Schlein, MD   6 months ago Digital mucinous cyst of finger   [place] [facility] Simmons-Robinson, [place], MD   9 months ago Breast pain   [place] [facility] [place], [place], PA-C   2 years ago Major depressive disorder, single episode, moderate [facility])   [place] [facility] [place]al[place], Marzella Schlein, MD       Future Appointments             In 1 month Bacigalupo, Marzella Schlein, MD [facility], [facility]   In 6 months Bacigalupo, Marzella Schlein, MD [facility], [facility]

## 2023-01-06 DIAGNOSIS — R0602 Shortness of breath: Secondary | ICD-10-CM | POA: Diagnosis not present

## 2023-01-06 DIAGNOSIS — R0789 Other chest pain: Secondary | ICD-10-CM | POA: Diagnosis not present

## 2023-01-06 DIAGNOSIS — E7800 Pure hypercholesterolemia, unspecified: Secondary | ICD-10-CM | POA: Diagnosis not present

## 2023-01-06 DIAGNOSIS — E785 Hyperlipidemia, unspecified: Secondary | ICD-10-CM | POA: Diagnosis not present

## 2023-01-06 DIAGNOSIS — R001 Bradycardia, unspecified: Secondary | ICD-10-CM | POA: Diagnosis not present

## 2023-01-06 DIAGNOSIS — R0601 Orthopnea: Secondary | ICD-10-CM | POA: Diagnosis not present

## 2023-01-06 DIAGNOSIS — R42 Dizziness and giddiness: Secondary | ICD-10-CM | POA: Diagnosis not present

## 2023-01-06 DIAGNOSIS — K219 Gastro-esophageal reflux disease without esophagitis: Secondary | ICD-10-CM | POA: Diagnosis not present

## 2023-01-06 DIAGNOSIS — I2089 Other forms of angina pectoris: Secondary | ICD-10-CM | POA: Diagnosis not present

## 2023-01-06 DIAGNOSIS — G4733 Obstructive sleep apnea (adult) (pediatric): Secondary | ICD-10-CM | POA: Diagnosis not present

## 2023-01-08 ENCOUNTER — Encounter: Payer: Self-pay | Admitting: Family Medicine

## 2023-01-08 ENCOUNTER — Ambulatory Visit: Payer: Self-pay

## 2023-01-08 ENCOUNTER — Ambulatory Visit (INDEPENDENT_AMBULATORY_CARE_PROVIDER_SITE_OTHER): Payer: HMO | Admitting: Family Medicine

## 2023-01-08 VITALS — BP 108/70 | HR 66 | Temp 98.1°F | Resp 14 | Ht 64.0 in | Wt 136.4 lb

## 2023-01-08 DIAGNOSIS — L304 Erythema intertrigo: Secondary | ICD-10-CM | POA: Diagnosis not present

## 2023-01-08 MED ORDER — NYSTATIN-TRIAMCINOLONE 100000-0.1 UNIT/GM-% EX OINT: 1.0000 | TOPICAL_OINTMENT | Freq: Two times a day (BID) | CUTANEOUS | 0 refills | Status: DC

## 2023-01-08 MED ORDER — DOXYCYCLINE HYCLATE 100 MG PO TABS: 100.0000 mg | ORAL_TABLET | Freq: Two times a day (BID) | ORAL | 0 refills | Status: DC

## 2023-01-08 NOTE — Telephone Encounter (Signed)
    Chief Complaint: Red rash in bend of both arms. Itchy, no pain. Red bumps. Has not used any new products. Symptoms: Above Frequency: 1 week ago Pertinent Negatives: Patient denies fever Disposition: [] ED /[] Urgent Care (no appt availability in office) / [x] Appointment(In office/virtual)/ []  [place] [facility]/ [] Home Care/ [] Refused Recommended Disposition /[] [place] [facility]/ []  Follow-up with PCP Additional Notes:   Reason for Disposition  Localized rash present > 7 days  Answer Assessment - Initial Assessment Questions 1. APPEARANCE of RASH: "Describe the rash."      Red, bumps, dry 2. LOCATION: "Where is the rash located?"      Both arms 3. NUMBER: "How many spots are there?"      N/a 4. SIZE: "How big are the spots?" (Inches, centimeters or compare to size of a coin)      N/a 5. ONSET: "When did the rash start?"      1 week ago 6. ITCHING: "Does the rash itch?" If Yes, ask: "How bad is the itch?"  (Scale 0-10; or none, mild, moderate, severe)     Mild 7. PAIN: "Does the rash hurt?" If Yes, ask: "How bad is the pain?"  (Scale 0-10; or none, mild, moderate, severe)    - NONE (0): no pain    - MILD (1-3): doesn't interfere with normal activities     - MODERATE (4-7): interferes with normal activities or awakens from sleep     - SEVERE (8-10): excruciating pain, unable to do any normal activities     No 8. OTHER SYMPTOMS: "Do you have any other symptoms?" (e.g., fever)     No 9. PREGNANCY: "Is there any chance you are pregnant?" "When was your last menstrual period?"     No  Protocols used: Rash or Redness - Localized-A-AH

## 2023-01-08 NOTE — Progress Notes (Signed)
I,Vanessa  Vital,acting as a Neurosurgeon for Jacky Kindle, FNP.,have documented all relevant documentation on the behalf of Jacky Kindle, FNP,as directed by  Jacky Kindle, FNP while in the presence of Jacky Kindle, FNP.    Established patient visit   Patient: Kristen Hays   DOB: Jan 22, 1946   77 y.o. Female  MRN: [id number] Visit Date: 01/08/2023  Today's healthcare provider: Jacky Kindle, FNP  Introduced to nurse practitioner role and practice setting.  All questions answered.  Discussed provider/patient relationship and expectations.  Chief Complaint  Patient presents with   Rash   Subjective    HPI HPI   Patient reports noticed rash last Saturday on both of her inner elbow area. States that skin has gotten red and flaky, has started to become itchy. Patient states that has tried neosporin and cortisone but has not helped any. Last edited by Lubertha Basque, CMA on 01/08/2023 11:07 AM.      Patient is coming in today for rash or her arms.  Medications: Outpatient Medications Prior to Visit  Medication Sig   atorvastatin (LIPITOR) 10 MG tablet Take 1 tablet (10 mg total) by mouth daily.   pantoprazole (PROTONIX) 40 MG tablet Take 40 mg by mouth 2 (two) times daily before a meal.    sertraline (ZOLOFT) 100 MG tablet Take 1 tablet (100 mg total) by mouth daily.   No facility-administered medications prior to visit.    Review of Systems  All other systems reviewed and are negative.    Objective    BP 108/70 (BP Location: Left Arm, Patient Position: Sitting, Cuff Size: Normal)   Pulse 66   Temp 98.1 F (36.7 C) (Oral)   Resp 14   Ht 5\' 4"  (1.626 m)   Wt 136 lb 6.4 oz (61.9 kg)   SpO2 99%   BMI 23.41 kg/m   Physical Exam Vitals and nursing note reviewed.  Constitutional:      General: She is not in acute distress.    Appearance: Normal appearance. She is normal weight. She is not ill-appearing, toxic-appearing or diaphoretic.  HENT:     Head: Normocephalic and  atraumatic.  Cardiovascular:     Rate and Rhythm: Normal rate and regular rhythm.     Pulses: Normal pulses.     Heart sounds: Normal heart sounds. No murmur heard.    No friction rub. No gallop.  Pulmonary:     Effort: Pulmonary effort is normal. No respiratory distress.     Breath sounds: Normal breath sounds. No stridor. No wheezing, rhonchi or rales.  Chest:     Chest wall: No tenderness.  Musculoskeletal:        General: No swelling, tenderness, deformity or signs of injury. Normal range of motion.     Right lower leg: No edema.     Left lower leg: No edema.  Skin:    General: Skin is warm and dry.     Capillary Refill: Capillary refill takes less than 2 seconds.     Coloration: Skin is not jaundiced or pale.     Findings: Erythema and rash present. No bruising or lesion.       Neurological:     General: No focal deficit present.     Mental Status: She is alert and oriented to person, place, and time. Mental status is at baseline.     Cranial Nerves: No cranial nerve deficit.     Sensory: No sensory deficit.  Motor: No weakness.     Coordination: Coordination normal.  Psychiatric:        Mood and Affect: Mood normal.        Behavior: Behavior normal.        Thought Content: Thought content normal.        Judgment: Judgment normal.     No results found for any visits on 01/08/23.  Assessment & Plan     Problem List Items Addressed This Visit       Musculoskeletal and Integument   Intertrigo - Primary    Acute, self limiting x7 days of s/s; not improved with topical ABX ointment or anti-itch medication Denies other systemic complaints including GI/URI/fever etc Denies sick contacts Denies travel Reports time outdoors; however, rash is not consistent with topical allergen/contact irritation; no vesicles noted Recommend use of ABX and topical steroid/antifungal to assist given concern for moisture Denies new meds/supps/soaps/detergents etc Return protocol  discussed with patient      Return if symptoms worsen or fail to improve.     Leilani Merl, FNP, have reviewed all documentation for this visit. The documentation on 01/08/23 for the exam, diagnosis, procedures, and orders are all accurate and complete.  Jacky Kindle, FNP  [facility] [phone] (phone) [phone] (fax)  [facility]

## 2023-01-08 NOTE — Assessment & Plan Note (Signed)
Acute, self limiting x7 days of s/s; not improved with topical ABX ointment or anti-itch medication Denies other systemic complaints including GI/URI/fever etc Denies sick contacts Denies travel Reports time outdoors; however, rash is not consistent with topical allergen/contact irritation; no vesicles noted Recommend use of ABX and topical steroid/antifungal to assist given concern for moisture Denies new meds/supps/soaps/detergents etc Return protocol discussed with patient

## 2023-01-13 ENCOUNTER — Ambulatory Visit: Payer: PPO | Admitting: Family Medicine

## 2023-02-24 ENCOUNTER — Ambulatory Visit: Payer: HMO | Admitting: Family Medicine

## 2023-03-16 ENCOUNTER — Other Ambulatory Visit: Payer: Self-pay | Admitting: Family Medicine

## 2023-03-16 DIAGNOSIS — Z1231 Encounter for screening mammogram for malignant neoplasm of breast: Secondary | ICD-10-CM

## 2023-03-22 DIAGNOSIS — R350 Frequency of micturition: Secondary | ICD-10-CM | POA: Diagnosis not present

## 2023-03-22 DIAGNOSIS — N390 Urinary tract infection, site not specified: Secondary | ICD-10-CM | POA: Diagnosis not present

## 2023-04-01 ENCOUNTER — Other Ambulatory Visit: Payer: Self-pay | Admitting: Family Medicine

## 2023-04-01 NOTE — Telephone Encounter (Signed)
Requested Prescriptions  Pending Prescriptions Disp Refills   sertraline (ZOLOFT) 100 MG tablet [Pharmacy Med Name: SERTRALINE HCL 100 MG TABLET] 30 tablet 3    Sig: TAKE 1 TABLET BY MOUTH EVERY DAY     Psychiatry:  Antidepressants - SSRI - sertraline Passed - 04/01/2023  2:05 AM      Passed - AST in normal range and within 360 days    AST  Date Value Ref Range Status  12/09/2022 14 0 - 40 IU/L Final         Passed - ALT in normal range and within 360 days    ALT  Date Value Ref Range Status  12/09/2022 22 0 - 32 IU/L Final         Passed - Completed PHQ-2 or PHQ-9 in the last 360 days      Passed - Valid encounter within last 6 months    Recent Outpatient Visits           2 months ago Intertrigo   [facility] Mer[facility] T, FNP   3 months ago Hypercholesterolemia   [place] [facility] [place], Marzella Schlein, MD   8 months ago Major depressive disorder, single episode, moderate [facility])   [facility] [place], Marzella Schlein, MD   9 months ago Digital mucinous cyst of finger   [place] [facility] Simmons-Robinson, [place], MD   12 months ago Breast pain   [place] [facility] [place], [place], PA-C       Future Appointments             In 3 months Bacigalupo, Marzella Schlein, MD [facility], [facility]

## 2023-04-16 ENCOUNTER — Ambulatory Visit
Admission: RE | Admit: 2023-04-16 | Discharge: 2023-04-16 | Disposition: A | Discharge status: Home / Self Care | Payer: HMO | Source: Ambulatory Visit | Attending: Family Medicine | Admitting: Family Medicine

## 2023-04-16 DIAGNOSIS — Z1231 Encounter for screening mammogram for malignant neoplasm of breast: Secondary | ICD-10-CM | POA: Insufficient documentation

## 2023-04-29 DIAGNOSIS — G4733 Obstructive sleep apnea (adult) (pediatric): Secondary | ICD-10-CM | POA: Diagnosis not present

## 2023-04-29 DIAGNOSIS — F321 Major depressive disorder, single episode, moderate: Secondary | ICD-10-CM | POA: Diagnosis not present

## 2023-05-11 ENCOUNTER — Other Ambulatory Visit
Admission: RE | Admit: 2023-05-11 | Discharge: 2023-05-11 | Disposition: A | Discharge status: Home / Self Care | Payer: HMO | Source: Ambulatory Visit | Attending: Ophthalmology | Admitting: Ophthalmology

## 2023-05-11 DIAGNOSIS — G4452 New daily persistent headache (NDPH): Secondary | ICD-10-CM | POA: Insufficient documentation

## 2023-05-11 DIAGNOSIS — G44219 Episodic tension-type headache, not intractable: Secondary | ICD-10-CM | POA: Diagnosis not present

## 2023-05-11 LAB — CBC
HCT: 45 % (ref 36.0–46.0)
Hemoglobin: 14.6 g/dL (ref 12.0–15.0)
MCH: 29.2 pg (ref 26.0–34.0)
MCHC: 32.4 g/dL (ref 30.0–36.0)
MCV: 90 fL (ref 80.0–100.0)
Platelets: 229 10*3/uL (ref 150–400)
RBC: 5 MIL/uL (ref 3.87–5.11)
RDW: 12.9 % (ref 11.5–15.5)
WBC: 6.1 10*3/uL (ref 4.0–10.5)
nRBC: 0 % (ref 0.0–0.2)

## 2023-05-11 LAB — C-REACTIVE PROTEIN: CRP: <0.5 mg/dL (ref <1.0)

## 2023-05-11 LAB — SEDIMENTATION RATE: Sed Rate: 2 mm/h (ref 0–30)

## 2023-05-13 ENCOUNTER — Other Ambulatory Visit: Payer: Self-pay | Admitting: Family Medicine

## 2023-05-13 DIAGNOSIS — E7800 Pure hypercholesterolemia, unspecified: Secondary | ICD-10-CM

## 2023-05-29 DIAGNOSIS — R140 Abdominal distension (gaseous): Secondary | ICD-10-CM | POA: Diagnosis not present

## 2023-05-29 DIAGNOSIS — K5909 Other constipation: Secondary | ICD-10-CM | POA: Diagnosis not present

## 2023-05-29 DIAGNOSIS — R1310 Dysphagia, unspecified: Secondary | ICD-10-CM | POA: Diagnosis not present

## 2023-05-29 DIAGNOSIS — Z8719 Personal history of other diseases of the digestive system: Secondary | ICD-10-CM | POA: Diagnosis not present

## 2023-05-29 DIAGNOSIS — K219 Gastro-esophageal reflux disease without esophagitis: Secondary | ICD-10-CM | POA: Diagnosis not present

## 2023-05-29 DIAGNOSIS — R1011 Right upper quadrant pain: Secondary | ICD-10-CM | POA: Diagnosis not present

## 2023-06-02 ENCOUNTER — Other Ambulatory Visit: Payer: Self-pay | Admitting: Nurse Practitioner

## 2023-06-02 DIAGNOSIS — R1011 Right upper quadrant pain: Secondary | ICD-10-CM

## 2023-06-03 ENCOUNTER — Ambulatory Visit
Admission: RE | Admit: 2023-06-03 | Discharge: 2023-06-03 | Disposition: A | Discharge status: Home / Self Care | Payer: HMO | Source: Ambulatory Visit | Attending: Nurse Practitioner | Admitting: Nurse Practitioner

## 2023-06-03 DIAGNOSIS — N200 Calculus of kidney: Secondary | ICD-10-CM | POA: Diagnosis not present

## 2023-06-03 DIAGNOSIS — R110 Nausea: Secondary | ICD-10-CM | POA: Diagnosis not present

## 2023-06-03 DIAGNOSIS — R1011 Right upper quadrant pain: Secondary | ICD-10-CM | POA: Diagnosis not present

## 2023-06-05 ENCOUNTER — Other Ambulatory Visit: Payer: Self-pay | Admitting: Nurse Practitioner

## 2023-06-05 DIAGNOSIS — R1011 Right upper quadrant pain: Secondary | ICD-10-CM

## 2023-06-05 DIAGNOSIS — K2270 Barrett's esophagus without dysplasia: Secondary | ICD-10-CM

## 2023-06-05 DIAGNOSIS — R1310 Dysphagia, unspecified: Secondary | ICD-10-CM

## 2023-06-09 ENCOUNTER — Ambulatory Visit (INDEPENDENT_AMBULATORY_CARE_PROVIDER_SITE_OTHER): Payer: HMO

## 2023-06-09 VITALS — Ht 64.0 in | Wt 136.0 lb

## 2023-06-09 DIAGNOSIS — Z0000 Encounter for general adult medical examination without abnormal findings: Secondary | ICD-10-CM

## 2023-06-09 NOTE — Patient Instructions (Signed)
Kristen Hays , Thank you for taking time to come for your Medicare Wellness Visit. I appreciate your ongoing commitment to your health goals. Please review the following plan we discussed and let me know if I can assist you in the future.   Referrals/Orders/Follow-Ups/Clinician Recommendations: none  This is a list of the screening recommended for you and due dates:  Health Maintenance  Topic Date Due   DTaP/Tdap/Td vaccine (1 - Tdap) Never done   COVID-19 Vaccine (4 - 2023-24 season) 05/03/2023   Medicare Annual Wellness Visit  06/08/2024   DEXA scan (bone density measurement)  06/26/2026   Pneumonia Vaccine  Completed   Flu Shot  Completed   Hepatitis C Screening  Completed   Zoster (Shingles) Vaccine  Completed   HPV Vaccine  Aged Out   Colon Cancer Screening  Discontinued    Advanced directives: (Declined) Advance directive discussed with you today. Even though you declined this today, please call our office should you change your mind, and we can give you the proper paperwork for you to fill out.  Next Medicare Annual Wellness Visit scheduled for next year: Yes 06/15/24 @ 9am telephone

## 2023-06-09 NOTE — Progress Notes (Signed)
Subjective:   Kristen Hays is a 77 y.o. female who presents for Medicare Annual (Subsequent) preventive examination.  Visit Complete: Virtual I connected with  Carrera B Sartin on 06/09/23 by a audio enabled telemedicine application and verified that I am speaking with the correct person using two identifiers.  Patient Location: Home  Provider Location: Office/Clinic  I discussed the limitations of evaluation and management by telemedicine. The patient expressed understanding and agreed to proceed.  Vital Signs: Because this visit was a virtual/telehealth visit, some criteria may be missing or patient reported. Any vitals not documented were not able to be obtained and vitals that have been documented are patient reported.  Patient Medicare AWV questionnaire was completed by the patient on 06/09/23; I have confirmed that all information answered by patient is correct and no changes since this date. Cardiac Risk Factors include: advanced age (>68men, >27 women);dyslipidemia    Objective:    Today's Vitals   06/09/23 0737 06/09/23 0910  Weight:  136 lb (61.7 kg)  Height:  5\' 4"  (1.626 m)  PainSc: 3     Body mass index is 23.34 kg/m.     06/09/2023    9:19 AM 06/02/2022    9:18 AM 08/08/2021   11:48 AM 05/31/2021    3:17 PM 04/11/2020   11:45 AM 01/23/2020   11:18 AM 01/08/2017    9:02 AM  Advanced Directives  Does Patient Have a Medical Advance Directive? No No Yes Yes Yes Yes Yes  Type of Advance Directive    Living will Healthcare Power of [place];Living will Healthcare Power of [place];Living will Living will  Does patient want to make changes to medical advance directive?   No - Patient declined No - Patient declined  Yes (Inpatient - patient defers changing a medical advance directive and declines information at this time)   Copy of Healthcare Power of Attorney in Chart?     No - copy requested No - copy requested   Would patient like information on creating a medical  advance directive?  No - Patient declined    No - Patient declined     Current Medications (verified) Outpatient Encounter Medications as of 06/09/2023  Medication Sig   atorvastatin (LIPITOR) 10 MG tablet TAKE 1 TABLET BY MOUTH EVERY DAY   nystatin-triamcinolone ointment (MYCOLOG) Apply 1 Application topically 2 (two) times daily.   pantoprazole (PROTONIX) 40 MG tablet Take 40 mg by mouth 2 (two) times daily before a meal.    sertraline (ZOLOFT) 100 MG tablet TAKE 1 TABLET BY MOUTH EVERY DAY   doxycycline (VIBRA-TABS) 100 MG tablet Take 1 tablet (100 mg total) by mouth 2 (two) times daily. (Patient not taking: Reported on 06/09/2023)   No facility-administered encounter medications on file as of 06/09/2023.    Allergies (verified) Patient has no known allergies.   History: Past Medical History:  Diagnosis Date   Anxiety    Barrett esophagus 2016   Breast cancer (HCC)    right breast c Mastectomy    Cataract had removed   Dysphagia    GERD (gastroesophageal reflux disease)    High cholesterol    Hypothyroidism    Sleep apnea use c pap   Past Surgical History:  Procedure Laterality Date   AUGMENTATION MAMMAPLASTY Bilateral    BREAST SURGERY     mastectomy-right; breast augmentation   CATARACT EXTRACTION W/PHACO Left 07/21/2016   Procedure: CATARACT EXTRACTION PHACO AND INTRAOCULAR LENS PLACEMENT (IOC);  Surgeon: Sherald Hess, MD;  Location: [facility];  Service: Ophthalmology;  Laterality: Left;  LEFT   CESAREAN SECTION     1974/1979   COLONOSCOPY N/A 01/17/2015   Procedure: COLONOSCOPY;  Surgeon: Scot Jun, MD;  Location: [facility] ENDOSCOPY;  Service: Endoscopy;  Laterality: N/A;   ESOPHAGOGASTRODUODENOSCOPY N/A 01/17/2015   Procedure: ESOPHAGOGASTRODUODENOSCOPY (EGD);  Surgeon: Scot Jun, MD;  Location: [facility] ENDOSCOPY;  Service: Endoscopy;  Laterality: N/A;   ESOPHAGOGASTRODUODENOSCOPY (EGD) WITH PROPOFOL N/A 02/17/2018   Procedure:  ESOPHAGOGASTRODUODENOSCOPY (EGD) WITH PROPOFOL;  Surgeon: Scot Jun, MD;  Location: [facility] ENDOSCOPY;  Service: Endoscopy;  Laterality: N/A;   EYE SURGERY Right 08/2014   MASTECTOMY Right 2011   SAVORY DILATION N/A 01/17/2015   Procedure: SAVORY DILATION;  Surgeon: Scot Jun, MD;  Location: [facility] ENDOSCOPY;  Service: Endoscopy;  Laterality: N/A;   TONSILLECTOMY  2011   Family History  Problem Relation Age of Onset   Diabetes Mother    Stroke Mother    Alzheimer's disease Mother    Bone cancer Father    Vascular Disease Father    Cancer Sister        breast   Breast cancer Sister    Irritable bowel syndrome Sister    Diabetes Sister    Healthy Brother    Healthy Sister    Breast cancer Paternal Aunt    Breast cancer Cousin    Social History   Socioeconomic History   Marital status: Married    Spouse name: Not on file   Number of children: 2   Years of education: HS   Highest education level: High school graduate  Occupational History   Occupation: retired    Associate Professor:  [employer]  Tobacco Use   Smoking status: Never   Smokeless tobacco: Never  Vaping Use   Vaping status: Never Used  Substance and Sexual Activity   Alcohol use: No   Drug use: No   Sexual activity: Yes    Birth control/protection: Post-menopausal  Other Topics Concern   Not on file  Social History Narrative   Not on file   Social Determinants of Health   Financial Resource Strain: Low Risk  (06/09/2023)   Overall Financial Resource Strain (CARDIA)    Difficulty of Paying Living Expenses: Not hard at all  Food Insecurity: Patient Declined (06/09/2023)   Hunger Vital Sign    Worried About Running Out of Food in the Last Year: Patient declined    Ran Out of Food in the Last Year: Patient declined  Transportation Needs: No Transportation Needs (06/09/2023)   PRAPARE - Administrator, Civil Service (Medical): No    Lack of Transportation (Non-Medical): No   Physical Activity: Insufficiently Active (06/09/2023)   Exercise Vital Sign    Days of Exercise per Week: 2 days    Minutes of Exercise per Session: 30 min  Stress: No Stress Concern Present (06/09/2023)   [employer] of Occupational Health - Occupational Stress Questionnaire    Feeling of Stress : Only a little  Social Connections: Unknown (06/09/2023)   Social Connection and Isolation Panel [NHANES]    Frequency of Communication with Friends and Family: More than three times a week    Frequency of Social Gatherings with Friends and Family: Once a week    Attends Religious Services: Not on Marketing executive or Organizations: No    Attends Banker Meetings: Never    Marital Status: Married  Tobacco Counseling Counseling given: Not Answered   Clinical Intake:  Pre-visit preparation completed: Yes  Pain : 0-10 Pain Score: 3  Pain Type: Chronic pain Pain Location: Back Pain Orientation: Lower Pain Descriptors / Indicators: Aching Pain Onset: More than a month ago Pain Frequency: Intermittent Pain Relieving Factors: Alieve or Tylenol;heat  Pain Relieving Factors: Alieve or Tylenol;heat  BMI - recorded: 23.34 Nutritional Status: BMI of 19-24  Normal Diabetes: No  How often do you need to have someone help you when you read instructions, pamphlets, or other written materials from your doctor or pharmacy?: 1 - Never  Interpreter Needed?: No  Comments: lives with husband Information entered by :: B.Zarinah Oviatt,LPN   Activities of Daily Living    06/09/2023    7:37 AM 01/08/2023   11:04 AM  In your present state of health, do you have any difficulty performing the following activities:  Hearing? 0 0  Vision? 0 0  Difficulty concentrating or making decisions? 0 0  Walking or climbing stairs? 0 0  Dressing or bathing? 0 0  Doing errands, shopping? 0 0  Preparing Food and eating ? N   Using the Toilet? N   In the past six months, have you  accidently leaked urine? Y   Do you have problems with loss of bowel control? N   Managing your Medications? N   Managing your Finances? N   Housekeeping or managing your Housekeeping? N     Patient Care Team: Erasmo Downer, MD as PCP - General (Family Medicine) Galen Manila, MD as Referring Physician (Ophthalmology)  Indicate any recent Medical Services you may have received from other than [facility] providers in the past year (date may be approximate).     Assessment:   This is a routine wellness examination for [place].  Hearing/Vision screen Hearing Screening - Comments:: Pt says it is hard to hear when in noisy place only but otherwise good Vision Screening - Comments:: Pt says has good vision;readers only   Goals Addressed             This Visit's Progress    DIET - EAT MORE FRUITS AND VEGETABLES   Not on track    DIET - INCREASE WATER INTAKE   Not on track    Recommend to drink at least 6-8 8oz glasses of water per day.       Depression Screen    06/09/2023    9:16 AM 01/08/2023   11:04 AM 12/09/2022    9:34 AM 07/15/2022    9:54 AM 06/02/2022    9:17 AM 05/31/2021    3:17 PM 01/01/2021   10:49 AM  PHQ 2/9 Scores  PHQ - 2 Score 0 0 1 0 0 0 0  PHQ- 9 Score  0 4 1 0  2    Fall Risk    06/09/2023    7:37 AM 01/08/2023   11:04 AM 12/09/2022    9:34 AM 07/15/2022    9:53 AM 06/02/2022    9:19 AM  Fall Risk   Falls in the past year? 0 0 1 0 0  Number falls in past yr: 0 0 0 0 0  Injury with Fall? 0 0 0 0 0  Risk for fall due to : No Fall Risks No Fall Risks History of fall(s) No Fall Risks No Fall Risks  Follow up Education provided;Falls prevention discussed Falls evaluation completed Falls evaluation completed Falls evaluation completed Falls prevention discussed;Falls evaluation completed    MEDICARE  RISK AT HOME: Medicare Risk at Home Any stairs in or around the home?: No If so, are there any without handrails?: No Home free of loose throw rugs in  walkways, pet beds, electrical cords, etc?: Yes Adequate lighting in your home to reduce risk of falls?: Yes Life alert?: No Use of a cane, walker or w/c?: No Grab bars in the bathroom?: No Shower chair or bench in shower?: No Elevated toilet seat or a handicapped toilet?: Yes  TIMED UP AND GO:  Was the test performed?  No    Cognitive Function:        06/09/2023    9:20 AM 06/02/2022    9:20 AM 05/31/2021    3:19 PM 02/11/2019   11:24 AM  6CIT Screen  What Year? 0 points 0 points 0 points 0 points  What month? 0 points 0 points 0 points 0 points  What time? 0 points 0 points 0 points 0 points  Count back from 20 0 points 0 points 0 points 0 points  Months in reverse 0 points 0 points 0 points 0 points  Repeat phrase 0 points 0 points 0 points 2 points  Total Score 0 points 0 points 0 points 2 points    Immunizations Immunization History  Administered Date(s) Administered   Fluad Quad(high Dose 65+) 05/25/2020, 05/12/2022   Influenza Split 06/15/2012   Influenza, High Dose Seasonal PF 06/06/2015, 06/07/2018, 04/21/2019, 04/21/2019, 06/23/2021   PFIZER(Purple Top)SARS-COV-2 Vaccination 10/11/2019, 11/01/2019, 09/08/2020   Pneumococcal Conjugate-13 12/09/2013   Pneumococcal Polysaccharide-23 01/07/2016   Zoster Recombinant(Shingrix) 01/07/2021, 04/17/2021    TDAP status: Up to date  Flu Vaccine status: Up to date  Pneumococcal vaccine status: Up to date  Covid-19 vaccine status: Completed vaccines  Qualifies for Shingles Vaccine? Yes   Zostavax completed Yes   Shingrix Completed?: Yes  Screening Tests Health Maintenance  Topic Date Due   DTaP/Tdap/Td (1 - Tdap) Never done   COVID-19 Vaccine (4 - 2023-24 season) 05/03/2023   Medicare Annual Wellness (AWV)  06/08/2024   DEXA SCAN  06/26/2026   Pneumonia Vaccine 29+ Years old  Completed   INFLUENZA VACCINE  Completed   Hepatitis C Screening  Completed   Zoster Vaccines- Shingrix  Completed   HPV VACCINES  Aged  Out   Colonoscopy  Discontinued    Health Maintenance  Health Maintenance Due  Topic Date Due   DTaP/Tdap/Td (1 - Tdap) Never done   COVID-19 Vaccine (4 - 2023-24 season) 05/03/2023    Colorectal cancer screening: No longer required.   Mammogram status: No longer required due to age.  Bone Density status: Completed 06/26/2021. Results reflect: Bone density results: NORMAL. Repeat every 5 years.  Lung Cancer Screening: (Low Dose CT Chest recommended if Age 76-80 years, 20 pack-year currently smoking OR have quit w/in 15years.) does not qualify.   Lung Cancer Screening Referral: no  Additional Screening:  Hepatitis C Screening: does not qualify; Completed 01/08/2017  Vision Screening: Recommended annual ophthalmology exams for early detection of glaucoma and other disorders of the eye. Is the patient up to date with their annual eye exam?  Yes  Who is the provider or what is the name of the office in which the patient attends annual eye exams? Dr Druscilla Brownie If pt is not established with a provider, would they like to be referred to a provider to establish care? No .   Dental Screening: Recommended annual dental exams for proper oral hygiene  Diabetic Foot Exam: n/a  Community  Resource Referral / Chronic Care Management: CRR required this visit?  No   CCM required this visit?  No     Plan:     I have personally reviewed and noted the following in the patient's chart:   Medical and social history Use of alcohol, tobacco or illicit drugs  Current medications and supplements including opioid prescriptions. Patient is not currently taking opioid prescriptions. Functional ability and status Nutritional status Physical activity Advanced directives List of other physicians Hospitalizations, surgeries, and ER visits in previous 12 months Vitals Screenings to include cognitive, depression, and falls Referrals and appointments  In addition, I have reviewed and discussed  with patient certain preventive protocols, quality metrics, and best practice recommendations. A written personalized care plan for preventive services as well as general preventive health recommendations were provided to patient.     Sue Lush, LPN   16/09/958   After Visit Summary: (MyChart) Due to this being a telephonic visit, the after visit summary with patients personalized plan was offered to patient via MyChart   Nurse Notes: The patient states she is doing well and has no concerns or questions at this time.

## 2023-06-10 ENCOUNTER — Ambulatory Visit
Admission: RE | Admit: 2023-06-10 | Discharge: 2023-06-10 | Disposition: A | Discharge status: Home / Self Care | Payer: HMO | Source: Ambulatory Visit | Attending: Nurse Practitioner | Admitting: Nurse Practitioner

## 2023-06-10 DIAGNOSIS — K2270 Barrett's esophagus without dysplasia: Secondary | ICD-10-CM | POA: Diagnosis not present

## 2023-06-10 DIAGNOSIS — R1310 Dysphagia, unspecified: Secondary | ICD-10-CM | POA: Diagnosis not present

## 2023-06-10 DIAGNOSIS — K2100 Gastro-esophageal reflux disease with esophagitis, without bleeding: Secondary | ICD-10-CM | POA: Diagnosis not present

## 2023-06-10 DIAGNOSIS — R1011 Right upper quadrant pain: Secondary | ICD-10-CM | POA: Diagnosis not present

## 2023-06-26 ENCOUNTER — Other Ambulatory Visit: Payer: Self-pay | Admitting: Family Medicine

## 2023-06-26 NOTE — Telephone Encounter (Signed)
Requested Prescriptions  Pending Prescriptions Disp Refills   sertraline (ZOLOFT) 100 MG tablet [Pharmacy Med Name: SERTRALINE HCL 100 MG TABLET] 90 tablet 0    Sig: TAKE 1 TABLET BY MOUTH EVERY DAY     Psychiatry:  Antidepressants - SSRI - sertraline Passed - 06/26/2023  2:42 AM      Passed - AST in normal range and within 360 days    AST  Date Value Ref Range Status  12/09/2022 14 0 - 40 IU/L Final         Passed - ALT in normal range and within 360 days    ALT  Date Value Ref Range Status  12/09/2022 22 0 - 32 IU/L Final         Passed - Completed PHQ-2 or PHQ-9 in the last 360 days      Passed - Valid encounter within last 6 months    Recent Outpatient Visits           5 months ago Intertrigo   [facility] Merita Norton T, FNP   6 months ago Hypercholesterolemia   [facility], Marzella Schlein, MD   11 months ago Major depressive disorder, single episode, moderate [facility])   [place] [facility] [place], Marzella Schlein, MD   1 year ago Digital mucinous cyst of finger   [place] [facility] [place], [place], MD   1 year ago Breast pain   [place] [facility] [place], [place], PA-C       Future Appointments             In 3 weeks Bac[place]alupo, Marzella Schlein, MD [facility]

## 2023-07-17 ENCOUNTER — Encounter: Payer: PPO | Admitting: Family Medicine

## 2023-07-20 ENCOUNTER — Ambulatory Visit (INDEPENDENT_AMBULATORY_CARE_PROVIDER_SITE_OTHER): Payer: HMO | Admitting: Family Medicine

## 2023-07-20 ENCOUNTER — Encounter: Payer: Self-pay | Admitting: Family Medicine

## 2023-07-20 VITALS — BP 117/76 | HR 63 | Ht 63.0 in | Wt 138.2 lb

## 2023-07-20 DIAGNOSIS — M940 Chondrocostal junction syndrome [Tietze]: Secondary | ICD-10-CM

## 2023-07-20 DIAGNOSIS — K2270 Barrett's esophagus without dysplasia: Secondary | ICD-10-CM

## 2023-07-20 DIAGNOSIS — H6123 Impacted cerumen, bilateral: Secondary | ICD-10-CM

## 2023-07-20 DIAGNOSIS — E7800 Pure hypercholesterolemia, unspecified: Secondary | ICD-10-CM | POA: Diagnosis not present

## 2023-07-20 DIAGNOSIS — F419 Anxiety disorder, unspecified: Secondary | ICD-10-CM | POA: Diagnosis not present

## 2023-07-20 DIAGNOSIS — F321 Major depressive disorder, single episode, moderate: Secondary | ICD-10-CM

## 2023-07-20 DIAGNOSIS — Z0000 Encounter for general adult medical examination without abnormal findings: Secondary | ICD-10-CM

## 2023-07-20 NOTE — Assessment & Plan Note (Signed)
Well controlled Continue zoloft

## 2023-07-20 NOTE — Assessment & Plan Note (Signed)
On atorvastatin 10 mg daily. Recent cholesterol levels checked in the spring were within normal limits. Discussed that atorvastatin effectively lowers cholesterol and reduces cardiovascular risk. - Continue atorvastatin 10 mg daily - Recheck cholesterol levels today to stay on schedule with physical

## 2023-07-20 NOTE — Assessment & Plan Note (Signed)
On Protonix 40 mg twice daily for Barrett's esophagus and reflux. Reports occasional dysphagia but recent swallowing test was normal. Discussed that Protonix helps manage reflux symptoms and prevent complications of Barrett's esophagus. - Continue Protonix 40 mg twice daily - Monitor for any changes in swallowing or reflux symptoms

## 2023-07-20 NOTE — Progress Notes (Signed)
Complete physical exam  Patient: Kristen Hays   DOB: March 17, 1946   77 y.o. Female  MRN: [id number]  Subjective:    Chief Complaint  Patient presents with   Annual Exam    AWV completed 06/09/23. Patient reports consuming a general diet and she tries walking 2-3 times a week for at least 45 minute. She reports feeling fairly well and sleeping well. Patient reports she has been having a chest pain all the way across more so on the right side. Describes it as pressure and like a wire is sticking in her only on the right side. Reports that is the side she had her mastectomy on    Kristen Hays is a 77 y.o. female who presents today for a complete physical exam.   Discussed the use of AI scribe software for clinical note transcription with the patient, who gave verbal consent to proceed.  History of Present Illness   The patient, with a history of anxiety and Barrett's esophagus, presents for a physical and to discuss new onset right sided chest pain. The pain, described as 'like a wire sticking in it,' has been occurring for about a month. It is intermittent, occurring about twice a week, and is described as a sharp, brief pain that 'comes and goes.' The pain is not associated with exertion and there is no discernible pattern to its occurrence.  The patient also reports increased difficulty with exertion, specifically noting shortness of breath when walking up hills. This has been a gradual change and the patient attributes it to aging.  In addition to the chest pain, the patient has been experiencing difficulty swallowing. They have seen a specialist and undergone a swallowing test, which reportedly came back normal. However, the patient still feels that something is not right.  The patient also reports a sensation of fullness in the ears, describing it as feeling 'stuffed full of cotton.'  Regarding their anxiety, the patient reports doing well since switching from Lexapro to Zoloft.  They feel that the Zoloft is providing better coverage of their anxiety symptoms.        Most recent fall risk assessment:    06/09/2023    7:37 AM  Fall Risk   Falls in the past year? 0  Number falls in past yr: 0  Injury with Fall? 0  Risk for fall due to : No Fall Risks  Follow up Education provided;Falls prevention discussed     Most recent depression screenings:    06/09/2023    9:16 AM 01/08/2023   11:04 AM  PHQ 2/9 Scores  PHQ - 2 Score 0 0  PHQ- 9 Score  0        Patient Care Team: Erasmo Downer, MD as PCP - General (Family Medicine) Galen Manila, MD as Referring Physician (Ophthalmology)   Outpatient Medications Prior to Visit  Medication Sig   atorvastatin (LIPITOR) 10 MG tablet TAKE 1 TABLET BY MOUTH EVERY DAY   pantoprazole (PROTONIX) 40 MG tablet Take 40 mg by mouth 2 (two) times daily before a meal.    sertraline (ZOLOFT) 100 MG tablet TAKE 1 TABLET BY MOUTH EVERY DAY   [DISCONTINUED] doxycycline (VIBRA-TABS) 100 MG tablet Take 1 tablet (100 mg total) by mouth 2 (two) times daily. (Patient not taking: Reported on 06/09/2023)   [DISCONTINUED] nystatin-triamcinolone ointment (MYCOLOG) Apply 1 Application topically 2 (two) times daily. (Patient not taking: Reported on 07/20/2023)   No facility-administered medications prior to visit.  ROS        Objective:     BP 117/76 (BP Location: Left Arm, Patient Position: Sitting, Cuff Size: Normal)   Pulse 63   Ht 5\' 3"  (1.6 m)   Wt 138 lb 3.2 oz (62.7 kg)   SpO2 97%   BMI 24.48 kg/m    Physical Exam Vitals reviewed.  Constitutional:      General: She is not in acute distress.    Appearance: Normal appearance. She is well-developed. She is not diaphoretic.  HENT:     Head: Normocephalic and atraumatic.     Right Ear: External ear normal. There is impacted cerumen.     Left Ear: External ear normal. There is impacted cerumen.     Nose: Nose normal.     Mouth/Throat:     Mouth: Mucous  membranes are moist.     Pharynx: Oropharynx is clear. No oropharyngeal exudate.  Eyes:     General: No scleral icterus.    Conjunctiva/sclera: Conjunctivae normal.     Pupils: Pupils are equal, round, and reactive to light.  Neck:     Thyroid: No thyromegaly.  Cardiovascular:     Rate and Rhythm: Normal rate and regular rhythm.     Heart sounds: Normal heart sounds. No murmur heard. Pulmonary:     Effort: Pulmonary effort is normal. No respiratory distress.     Breath sounds: Normal breath sounds. No wheezing or rales.  Abdominal:     General: There is no distension.     Palpations: Abdomen is soft.     Tenderness: There is no abdominal tenderness.  Musculoskeletal:        General: No deformity.     Cervical back: Neck supple.     Right lower leg: No edema.     Left lower leg: No edema.  Lymphadenopathy:     Cervical: No cervical adenopathy.  Skin:    General: Skin is warm and dry.     Findings: No rash.  Neurological:     Mental Status: She is alert. Mental status is at baseline.     Gait: Gait normal.  Psychiatric:        Mood and Affect: Mood normal.        Behavior: Behavior normal.        Thought Content: Thought content normal.      No results found for any visits on 07/20/23.     Assessment & Plan:    Routine Health Maintenance and Physical Exam  Immunization History  Administered Date(s) Administered   Fluad Quad(high Dose 65+) 05/25/2020, 05/12/2022   Fluad Trivalent(High Dose 65+) 06/06/2023   Influenza Split 06/15/2012   Influenza, High Dose Seasonal PF 06/06/2015, 06/07/2018, 04/21/2019, 04/21/2019, 06/23/2021   PFIZER(Purple Top)SARS-COV-2 Vaccination 10/11/2019, 11/01/2019, 09/08/2020   Pneumococcal Conjugate-13 12/09/2013   Pneumococcal Polysaccharide-23 01/07/2016   Zoster Recombinant(Shingrix) 01/07/2021, 04/17/2021    Health Maintenance  Topic Date Due   COVID-19 Vaccine (4 - 2023-24 season) 05/03/2023   DTaP/Tdap/Td (1 - Tdap) 06/08/2024  (Originally 05/03/1965)   Medicare Annual Wellness (AWV)  06/08/2024   DEXA SCAN  06/26/2026   Pneumonia Vaccine 77+ Years old  Completed   INFLUENZA VACCINE  Completed   Hepatitis C Screening  Completed   Zoster Vaccines- Shingrix  Completed   HPV VACCINES  Aged Out   Colonoscopy  Discontinued    Discussed health benefits of physical activity, and encouraged her to engage in regular exercise appropriate for her age and condition.  Problem List Items Addressed This Visit       Digestive   Barrett esophagus    On Protonix 40 mg twice daily for Barrett's esophagus and reflux. Reports occasional dysphagia but recent swallowing test was normal. Discussed that Protonix helps manage reflux symptoms and prevent complications of Barrett's esophagus. - Continue Protonix 40 mg twice daily - Monitor for any changes in swallowing or reflux symptoms        Other   Anxiety    Switched from Lexapro to Zoloft 100 mg daily for better management of anxiety symptoms. Reports doing well on the new medication. Discussed that Zoloft is effective in reducing anxiety symptoms and is well-tolerated. - Continue Zoloft 100 mg daily - Follow up in one year unless symptoms change      Relevant Orders   TSH   Hypercholesterolemia    On atorvastatin 10 mg daily. Recent cholesterol levels checked in the spring were within normal limits. Discussed that atorvastatin effectively lowers cholesterol and reduces cardiovascular risk. - Continue atorvastatin 10 mg daily - Recheck cholesterol levels today to stay on schedule with physical      Relevant Orders   Comprehensive metabolic panel   Lipid panel   Major depressive disorder, single episode, moderate (HCC)    Well controlled Continue zoloft      Relevant Orders   TSH   Other Visit Diagnoses     Encounter for annual physical exam    -  Primary   Relevant Orders   Comprehensive metabolic panel   Lipid panel   TSH   Costochondritis       Bilateral  impacted cerumen               Costochondritis Intermittent right-sided chest pain for about a month, described as sharp and stabbing, lasting less than a minute. Pain is reproducible with palpation, suggesting costochondritis. No exertional symptoms or dyspnea. Explained that costochondritis is inflammation of the cartilage where the breastbone meets the ribs, causing sharp pain with movement or deep breaths. Reassured it is not cardiac pain and will improve over time. - Recommend ibuprofen or Aleve as needed for pain - Advise to ice the affected area to reduce inflammation - Provide education materials on costochondritis  Ear Wax Impaction Reports feeling of fullness in both ears, consistent with ear wax impaction. Explained that ear wax impaction can cause a sensation of fullness and recommended over-the-counter treatment to soften the wax. - Recommend over-the-counter Debrox drops daily for one week to soften ear wax - Advise to follow up if symptoms persist for potential in-office ear flushing  General Health Maintenance Up to date on flu, pneumonia, and shingles vaccines. Discussed RSV vaccine, recommended for individuals over 60. Explained that RSV is a respiratory virus that can cause severe illness in older adults and that the vaccine can reduce hospitalizations. Mentioned that the vaccine is covered by insurance at the pharmacy and is a one-time dose. - Recommend RSV vaccine, to be administered at the pharmacy - Order thyroid function test - Order kidney and liver function tests including blood sugar - Schedule follow-up in one year unless symptoms change  Follow-up - Schedule follow-up appointment in one year - Advise to report any changes in chest pain or increased shortness of breath with exertion.       Return in about 1 year (around 07/19/2024) for CPE.     Shirlee Latch, MD

## 2023-07-20 NOTE — Assessment & Plan Note (Signed)
Switched from Lexapro to Zoloft 100 mg daily for better management of anxiety symptoms. Reports doing well on the new medication. Discussed that Zoloft is effective in reducing anxiety symptoms and is well-tolerated. - Continue Zoloft 100 mg daily - Follow up in one year unless symptoms change

## 2023-07-21 LAB — LIPID PANEL
Chol/HDL Ratio: 5.3 ratio — ABNORMAL HIGH (ref 0.0–4.4)
Cholesterol, Total: 242 mg/dL — ABNORMAL HIGH (ref 100–199)
HDL: 46 mg/dL (ref >39)
LDL Chol Calc (NIH): 173 mg/dL — ABNORMAL HIGH (ref 0–99)
Triglycerides: 128 mg/dL (ref 0–149)
VLDL Cholesterol Cal: 23 mg/dL (ref 5–40)

## 2023-07-21 LAB — COMPREHENSIVE METABOLIC PANEL
ALT: 15 [IU]/L (ref 0–32)
AST: 12 [IU]/L (ref 0–40)
Albumin: 4.1 g/dL (ref 3.8–4.8)
Alkaline Phosphatase: 64 [IU]/L (ref 44–121)
BUN/Creatinine Ratio: 22 (ref 12–28)
BUN: 17 mg/dL (ref 8–27)
Bilirubin Total: 0.6 mg/dL (ref 0.0–1.2)
CO2: 25 mmol/L (ref 20–29)
Calcium: 9.3 mg/dL (ref 8.7–10.3)
Chloride: 104 mmol/L (ref 96–106)
Creatinine, Ser: 0.78 mg/dL (ref 0.57–1.00)
Globulin, Total: 2.3 g/dL (ref 1.5–4.5)
Glucose: 90 mg/dL (ref 70–99)
Potassium: 4.8 mmol/L (ref 3.5–5.2)
Sodium: 143 mmol/L (ref 134–144)
Total Protein: 6.4 g/dL (ref 6.0–8.5)
eGFR: 79 mL/min/{1.73_m2} (ref >59)

## 2023-07-21 LAB — TSH: TSH: 1.2 u[IU]/mL (ref 0.450–4.500)

## 2023-07-29 ENCOUNTER — Ambulatory Visit: Payer: HMO | Admitting: Urology

## 2023-07-29 ENCOUNTER — Ambulatory Visit
Admission: RE | Admit: 2023-07-29 | Discharge: 2023-07-29 | Disposition: A | Discharge status: Home / Self Care | Payer: HMO | Source: Ambulatory Visit | Attending: Urology | Admitting: Urology

## 2023-07-29 VITALS — BP 130/82 | HR 72 | Ht 64.0 in | Wt 138.1 lb

## 2023-07-29 DIAGNOSIS — R1011 Right upper quadrant pain: Secondary | ICD-10-CM

## 2023-07-29 DIAGNOSIS — N200 Calculus of kidney: Secondary | ICD-10-CM

## 2023-07-29 DIAGNOSIS — Z0389 Encounter for observation for other suspected diseases and conditions ruled out: Secondary | ICD-10-CM | POA: Diagnosis not present

## 2023-07-29 LAB — URINALYSIS, COMPLETE
Bilirubin, UA: NEGATIVE
Glucose, UA: NEGATIVE
Ketones, UA: NEGATIVE
Nitrite, UA: NEGATIVE
Protein,UA: NEGATIVE
Specific Gravity, UA: >1.03 — ABNORMAL HIGH (ref 1.005–1.030)
Urobilinogen, Ur: 0.2 mg/dL (ref 0.2–1.0)
pH, UA: 5.5 (ref 5.0–7.5)

## 2023-07-29 LAB — MICROSCOPIC EXAMINATION: Epithelial Cells (non renal): >10 /[HPF] — AB (ref 0–10)

## 2023-07-29 NOTE — Progress Notes (Signed)
Marcelle Overlie Plume,acting as a scribe for Vanna Scotland, MD.,have documented all relevant documentation on the behalf of Vanna Scotland, MD,as directed by  Vanna Scotland, MD while in the presence of Vanna Scotland, MD.  07/29/23 10:33 AM   Kristen Hays [birth year] [id number]  Referring provider: Theadore Nan, NP [phone] [facility] [address] [facility] [place],  [place] [address]  Chief Complaint  Patient presents with   Establish Care   Nephrolithiasis    HPI: 77 year-old female who presents today for further evaluation of kidney stone.   She underwent abdominal ultrasound for intermittent right upper quadrant pain as part of a GI workup. It showed an incidental right loser pole stone. I personally reviewed that study. It is likely an overestimate of the size of the stone.   Her urinalysis today looks contaminated with >10 epithelial cells, 6-10 WBC, otherwise negative.  She reports experiencing sharp pains in the right upper quadrant that lasted three to four minutes, but these episodes were self-limited. She has never had kidney stones before.   PMH: Past Medical History:  Diagnosis Date   Anxiety    Barrett esophagus 2016   Breast cancer (HCC)    right breast c Mastectomy    Cataract had removed   Digital mucinous cyst of finger 06/12/2022   Dysphagia    GERD (gastroesophageal reflux disease)    High cholesterol    Hypothyroidism    Sleep apnea use c pap    Surgical History: Past Surgical History:  Procedure Laterality Date   AUGMENTATION MAMMAPLASTY Bilateral    BREAST SURGERY     mastectomy-right; breast augmentation   CATARACT EXTRACTION W/PHACO Left 07/21/2016   Procedure: CATARACT EXTRACTION PHACO AND INTRAOCULAR LENS PLACEMENT (IOC);  Surgeon: Sherald Hess, MD;  Location: [facility];  Service: Ophthalmology;  Laterality: Left;  LEFT   CESAREAN SECTION     1974/1979   COLONOSCOPY N/A 01/17/2015   Procedure: COLONOSCOPY;   Surgeon: Scot Jun, MD;  Location: [facility];  Service: Endoscopy;  Laterality: N/A;   ESOPHAGOGASTRODUODENOSCOPY N/A 01/17/2015   Procedure: ESOPHAGOGASTRODUODENOSCOPY (EGD);  Surgeon: Scot Jun, MD;  [facility];  Service: Endoscopy;  Laterality: N/A;   ESOPHAGOGASTRODUODENOSCOPY (EGD) WITH PROPOFOL N/A 02/17/2018   Procedure: ESOPHAGOGASTRODUODENOSCOPY (EGD) WITH PROPOFOL;  Surgeon: Scot Jun, MD;  Location: [facility] ENDOSCOPY;  Service: Endoscopy;  Laterality: N/A;   EYE SURGERY Right 08/2014   MASTECTOMY Right 2011   SAVORY DILATION N/A 01/17/2015   Procedure: SAVORY DILATION;  Surgeon: Scot Jun, MD;  Location: [facility] ENDOSCOPY;  Service: Endoscopy;  Laterality: N/A;   TONSILLECTOMY  2011    Home Medications:  Allergies as of 07/29/2023   No Known Allergies      Medication List        Accurate as of July 29, 2023 10:33 AM. If you have any questions, ask your nurse or doctor.          atorvastatin 10 MG tablet Commonly known as: LIPITOR TAKE 1 TABLET BY MOUTH EVERY DAY   pantoprazole 40 MG tablet Commonly known as: PROTONIX Take 40 mg by mouth 2 (two) times daily before a meal.   sertraline 100 MG tablet Commonly known as: ZOLOFT TAKE 1 TABLET BY MOUTH EVERY DAY        Family History: Family History  Problem Relation Age of Onset   Diabetes Mother    Stroke Mother    Alzheimer's disease Mother  Bone cancer Father    Vascular Disease Father    Cancer Sister        breast   Breast cancer Sister    Irritable bowel syndrome Sister    Diabetes Sister    Healthy Brother    Healthy Sister    Breast cancer Paternal Aunt    Breast cancer Cousin     Social History:  reports that she has never smoked. She has never used smokeless tobacco. She reports that she does not drink alcohol and does not use drugs.   Physical Exam: BP 130/82   Pulse 72   Ht 5\' 4"  (1.626 m)   Wt 138 lb 2 oz (62.7 kg)   BMI 23.71  kg/m   Constitutional:  Alert and oriented, No acute distress. HEENT: [place] AT, moist mucus membranes.  Trachea midline, no masses. Neurologic: Grossly intact, no focal deficits, moving all 4 extremities. Psychiatric: Normal mood and affect.  Pertinent Imaging: EXAM: ABDOMEN ULTRASOUND COMPLETE   COMPARISON:  November 29, 2018   FINDINGS: Gallbladder: No gallstones or wall thickening visualized. No sonographic Murphy sign noted by sonographer.   Common bile duct: Diameter: Visualized portion measures 4 mm, within normal limits.   Liver: No focal lesion identified. Within normal limits in parenchymal echogenicity. Portal vein is patent on color Doppler imaging with normal direction of blood flow towards the liver.   IVC: No abnormality visualized.   Pancreas: Majority is not visualized secondary to shadowing bowel gas. Pancreatic duct is incidentally noted and measures approximately 3 mm at the body. This is within the range of normal.   Spleen: Size and appearance within normal limits.   Right Kidney: Length: 10.2 cm. Echogenicity within normal limits. No mass or hydronephrosis visualized. Echogenic focus with twinkle artifact in the inferior pole measuring 7 mm most consistent with a nonobstructing nephrolithiasis.   Left Kidney: Length: 10.7 cm. Echogenicity within normal limits. No mass or hydronephrosis visualized.   Abdominal aorta: No aneurysm visualized.   Other findings: None.   IMPRESSION: 1. Nonobstructing nephrolithiasis in the inferior pole of the right kidney. 2. If further imaging is desired, recommend dedicated contrast enhanced CT or MRI.     Electronically Signed   By: Meda Klinefelter M.D.   On: 06/21/2023 11:20  This was personally reviewed and I believe it is likely an overestimate of the size of the stone.    Assessment & Plan:    1. Possible kidney stone - Incidental, asymptomatic - KUB to assess the true size and presence of the stone.  If the stone is small or not present, conservative management is recommended.  - Advised to maintain adequate hydration to prevent stone growth.  2. Right upper quadrant pain - Pain is self-limited and not consistent with kidney stone pain. Likely unrelated to the incidental finding of the kidney stone - Monitor symptoms. No immediate intervention required unless symptoms persist or worsen  Return if symptoms worsen or fail to improve.  I have reviewed the above documentation for accuracy and completeness, and I agree with the above.   Vanna Scotland, MD   [facility] [address], [address] [place], [place] [address] [phone]

## 2023-08-18 DIAGNOSIS — H6123 Impacted cerumen, bilateral: Secondary | ICD-10-CM | POA: Diagnosis not present

## 2023-08-18 DIAGNOSIS — H60543 Acute eczematoid otitis externa, bilateral: Secondary | ICD-10-CM | POA: Diagnosis not present

## 2023-09-24 ENCOUNTER — Other Ambulatory Visit: Payer: Self-pay | Admitting: Family Medicine

## 2023-09-24 NOTE — Telephone Encounter (Signed)
Requested Prescriptions  Pending Prescriptions Disp Refills   sertraline (ZOLOFT) 100 MG tablet [Pharmacy Med Name: SERTRALINE HCL 100 MG TABLET] 90 tablet 0    Sig: TAKE 1 TABLET BY MOUTH EVERY DAY     Psychiatry:  Antidepressants - SSRI - sertraline Passed - 09/24/2023 10:15 AM      Passed - AST in normal range and within 360 days    AST  Date Value Ref Range Status  07/20/2023 12 0 - 40 IU/L Final         Passed - ALT in normal range and within 360 days    ALT  Date Value Ref Range Status  07/20/2023 15 0 - 32 IU/L Final         Passed - Completed PHQ-2 or PHQ-9 in the last 360 days      Passed - Valid encounter within last 6 months    Recent Outpatient Visits           2 months ago Encounter for annual physical exam   [facility], Marzella Schlein, MD   8 months ago Intertrigo   [facility] Merita Norton T, FNP   9 months ago Hypercholesterolemia   [facility], Marzella Schlein, MD   1 year ago Major depressive disorder, single episode, moderate [facility])   [facility], Marzella Schlein, MD   1 year ago Digital mucinous cyst of finger   [facility], Tawanna Cooler, MD       Future Appointments             In 9 months Bacigalupo, Marzella Schlein, MD [facility]

## 2023-10-19 DIAGNOSIS — H35371 Puckering of macula, right eye: Secondary | ICD-10-CM | POA: Diagnosis not present

## 2023-10-19 DIAGNOSIS — H0289 Other specified disorders of eyelid: Secondary | ICD-10-CM | POA: Diagnosis not present

## 2023-10-19 DIAGNOSIS — Z961 Presence of intraocular lens: Secondary | ICD-10-CM | POA: Diagnosis not present

## 2023-10-19 DIAGNOSIS — H04123 Dry eye syndrome of bilateral lacrimal glands: Secondary | ICD-10-CM | POA: Diagnosis not present

## 2023-10-19 DIAGNOSIS — G4452 New daily persistent headache (NDPH): Secondary | ICD-10-CM | POA: Diagnosis not present

## 2023-11-19 DIAGNOSIS — K219 Gastro-esophageal reflux disease without esophagitis: Secondary | ICD-10-CM | POA: Diagnosis not present

## 2023-11-19 DIAGNOSIS — K224 Dyskinesia of esophagus: Secondary | ICD-10-CM | POA: Diagnosis not present

## 2023-11-19 DIAGNOSIS — R1314 Dysphagia, pharyngoesophageal phase: Secondary | ICD-10-CM | POA: Diagnosis not present

## 2023-11-19 DIAGNOSIS — K449 Diaphragmatic hernia without obstruction or gangrene: Secondary | ICD-10-CM | POA: Diagnosis not present

## 2023-12-24 ENCOUNTER — Other Ambulatory Visit: Payer: Self-pay | Admitting: Family Medicine

## 2023-12-24 DIAGNOSIS — L234 Allergic contact dermatitis due to dyes: Secondary | ICD-10-CM | POA: Diagnosis not present

## 2023-12-25 NOTE — Telephone Encounter (Signed)
 LOV 07/20/2023 for CPE  Requested Prescriptions  Pending Prescriptions Disp Refills   sertraline  (ZOLOFT ) 100 MG tablet [Pharmacy Med Name: SERTRALINE  HCL 100 MG TABLET] 90 tablet 1    Sig: TAKE 1 TABLET BY MOUTH EVERY DAY     Psychiatry:  Antidepressants - SSRI - sertraline  Failed - 12/25/2023  8:09 AM      Failed - Valid encounter within last 6 months    Recent Outpatient Visits   None     Future Appointments             In 6 months Bacigalupo, Stan Eans, MD [facility]            Passed - AST in normal range and within 360 days    AST  Date Value Ref Range Status  07/20/2023 12 0 - 40 IU/L Final         Passed - ALT in normal range and within 360 days    ALT  Date Value Ref Range Status  07/20/2023 15 0 - 32 IU/L Final         Passed - Completed PHQ-2 or PHQ-9 in the last 360 days

## 2024-01-06 DIAGNOSIS — R001 Bradycardia, unspecified: Secondary | ICD-10-CM | POA: Diagnosis not present

## 2024-01-06 DIAGNOSIS — R42 Dizziness and giddiness: Secondary | ICD-10-CM | POA: Diagnosis not present

## 2024-01-06 DIAGNOSIS — R0602 Shortness of breath: Secondary | ICD-10-CM | POA: Diagnosis not present

## 2024-01-06 DIAGNOSIS — E785 Hyperlipidemia, unspecified: Secondary | ICD-10-CM | POA: Diagnosis not present

## 2024-01-06 DIAGNOSIS — G4733 Obstructive sleep apnea (adult) (pediatric): Secondary | ICD-10-CM | POA: Diagnosis not present

## 2024-01-06 DIAGNOSIS — K219 Gastro-esophageal reflux disease without esophagitis: Secondary | ICD-10-CM | POA: Diagnosis not present

## 2024-02-18 NOTE — Progress Notes (Unsigned)
 Established patient visit  Patient: Kristen Hays   DOB: 05/06/1946   77 y.o. Female  MRN: [id number] Visit Date: 02/19/2024  Today's healthcare provider: Jolynn Spencer, PA-C   Chief Complaint  Patient presents with  . Pain    Pain in rt breat under arm x 1 month place where she had her breast removed. Pain goes across her chest.   Subjective      Discussed the use of AI scribe software for clinical note transcription with the patient, who gave verbal consent to proceed.  History of Present Illness   Discussed the use of AI scribe software for clinical note transcription with the patient, who gave verbal consent to proceed.  History of Present Illness   Kristen Hays is a 78 years old female with a history of right breast cancer and mastectomy who presented with swelling and discomfort in the right breast implant area.  She experiences pain and discomfort in the right implant area for 1.5 to to 2 months, exacerbated by physical activities, with a pain level of 6 out of 10 on bad days.  The pain is localized and sometimes tender to touch. the right implant, placed after mastectomy in 2010, feels scarred and uncomfortable.  She describes it as bumpy and hard annual mammograms, including the most recent in August 2024 show no abnormalities.  In 2021, she discussed reconstruction and implant removal with plastic surgeon but did not proceed with surgery.  Physical therapy did not relieve symptoms.  The left implant, placed without mastectomy causes no issues.  No fever or changes in breast size are noted     02/19/2024    8:15 AM 06/09/2023    9:16 AM 01/08/2023   11:04 AM  Depression screen PHQ 2/9  Decreased Interest 0 0 0  Down, Depressed, Hopeless 0 0 0  PHQ - 2 Score 0 0 0  Altered sleeping 0  0  Tired, decreased energy 0  0  Change in appetite 0  0  Feeling bad or failure about yourself  0  0  Trouble concentrating 0  0  Moving slowly or fidgety/restless 0  0  Suicidal  thoughts 0  0  PHQ-9 Score 0  0  Difficult doing work/chores Not difficult at all  Not difficult at all      02/19/2024    8:16 AM 10/23/2017    1:29 PM 11/12/2016    4:44 PM  GAD 7 : Generalized Anxiety Score  Nervous, Anxious, on Edge 0 3 1  Control/stop worrying 0 3 1  Worry too much - different things 0 3 1  Trouble relaxing 0 0 0  Restless 0 0 0  Easily annoyed or irritable 0 0 1  Afraid - awful might happen 0 0 0  Total GAD 7 Score 0 9 4  Anxiety Difficulty Not difficult at all Somewhat difficult Somewhat difficult    Medications: Outpatient Medications Prior to Visit  Medication Sig  . atorvastatin  (LIPITOR) 10 MG tablet TAKE 1 TABLET BY MOUTH EVERY DAY  . pantoprazole (PROTONIX) 40 MG tablet Take 40 mg by mouth 2 (two) times daily before a meal.   . sertraline  (ZOLOFT ) 100 MG tablet TAKE 1 TABLET BY MOUTH EVERY DAY   No facility-administered medications prior to visit.    Review of Systems  All other systems reviewed and are negative.  All negative Except see HPI       Objective    BP 110/68 (BP Location: Left Arm,  Patient Position: Sitting)   Pulse 67   Resp 16   Ht 5' 3 (1.6 m)   Wt 143 lb (64.9 kg)   SpO2 98%   BMI 25.33 kg/m     Physical Exam Vitals reviewed.  Constitutional:      General: She is not in acute distress.    Appearance: Normal appearance. She is well-developed. She is not diaphoretic.  HENT:     Head: Normocephalic and atraumatic.   Eyes:     General: No scleral icterus.    Conjunctiva/sclera: Conjunctivae normal.   Neck:     Thyroid : No thyromegaly.   Cardiovascular:     Rate and Rhythm: Normal rate and regular rhythm.     Pulses: Normal pulses.     Heart sounds: Normal heart sounds. No murmur heard. Pulmonary:     Effort: Pulmonary effort is normal. No respiratory distress.     Breath sounds: Normal breath sounds. No wheezing, rhonchi or rales.  Chest:    Musculoskeletal:     Cervical back: Neck supple.     Right  lower leg: No edema.     Left lower leg: No edema.  Lymphadenopathy:     Cervical: No cervical adenopathy.   Skin:    General: Skin is warm and dry.     Findings: No rash.   Neurological:     Mental Status: She is alert and oriented to person, place, and time. Mental status is at baseline.   Psychiatric:        Mood and Affect: Mood normal.        Behavior: Behavior normal.     No results found for any visits on 02/19/24.      Assessment & Plan  Right breast pain antibiotic complications Chronic right breast implant pain, possible leakage or mechanical irritation.  Normal mammograms, no malignancy hesitant about surgery but open to discussion. Referred to plastic surgeon Dr. Lowery Stagger in [place] for evaluation. Pt saw her on 01/13/20 Advised scheduling appointment to discuss surgical options, including implant removal or replacement Discussed potential insurance coverage for procedures if pain is significant Encourage inquiry about expected outcomes and symmetry with surgical intervention Per chart review patient was seen for breast pain in 2023 Ultrasound of the right breast in 2023 showed no abnormalities Mammogram showed retropectoral implants without no findings suspicious for malignancy in 2023 and invent 2024  Compliant with annual mammograms, latest normal Continue annual mammogram screening   Orders Placed This Encounter  Procedures  . Ambulatory referral to Plastic Surgery    Referral Priority:   Urgent    Referral Type:   Surgical    Referral Reason:   Specialty Services Required    Requested Specialty:   Plastic Surgery    Number of Visits Requested:   1    No follow-ups on file.   The patient was advised to call back or seek an in-person evaluation if the symptoms worsen or if the condition fails to improve as anticipated.  I discussed the assessment and treatment plan with the patient. The patient was provided an opportunity to ask questions  and all were answered. The patient agreed with the plan and demonstrated an understanding of the instructions.  I, Aarin Sparkman, PA-C have reviewed all documentation for this visit. The documentation on 02/19/2024  for the exam, diagnosis, procedures, and orders are all accurate and complete.  Jolynn Spencer, [facility], MMS [facility] [phone] (phone) [phone] (fax)  [facility]

## 2024-02-19 ENCOUNTER — Encounter: Payer: Self-pay | Admitting: Physician Assistant

## 2024-02-19 ENCOUNTER — Ambulatory Visit (INDEPENDENT_AMBULATORY_CARE_PROVIDER_SITE_OTHER): Admitting: Physician Assistant

## 2024-02-19 VITALS — BP 110/68 | HR 67 | Resp 16 | Ht 63.0 in | Wt 143.0 lb

## 2024-02-19 DIAGNOSIS — Z9011 Acquired absence of right breast and nipple: Secondary | ICD-10-CM | POA: Diagnosis not present

## 2024-02-19 DIAGNOSIS — R0789 Other chest pain: Secondary | ICD-10-CM | POA: Diagnosis not present

## 2024-03-08 ENCOUNTER — Other Ambulatory Visit: Payer: Self-pay | Admitting: Physician Assistant

## 2024-03-08 ENCOUNTER — Encounter: Payer: Self-pay | Admitting: Podiatry

## 2024-03-08 ENCOUNTER — Ambulatory Visit: Payer: Self-pay

## 2024-03-08 ENCOUNTER — Ambulatory Visit (INDEPENDENT_AMBULATORY_CARE_PROVIDER_SITE_OTHER)

## 2024-03-08 ENCOUNTER — Ambulatory Visit: Payer: Self-pay | Admitting: Podiatry

## 2024-03-08 VITALS — Ht 63.0 in | Wt 143.0 lb

## 2024-03-08 DIAGNOSIS — M7751 Other enthesopathy of right foot: Secondary | ICD-10-CM

## 2024-03-08 NOTE — Telephone Encounter (Signed)
 FYI Only or Action Required?: Action required by provider: request for appointment, update on patient condition, and requesting mammogram.  Patient was last seen in primary care on 07/20/2023 by Myrla Jon HERO, MD.  Called Nurse Triage reporting Breast Pain.  Symptoms began several months ago.  Interventions attempted: Nothing.  Symptoms are: right breast pain/tenderness unchanged.  Triage Disposition: See PCP Within 2 Weeks  Patient/caregiver understands and will follow disposition?: No, wishes to speak with PCP             Copied from CRM [id number]. Topic: Clinical - Red Word Triage >> Mar 08, 2024  2:29 PM Nathanel BROCKS wrote: Red Word that prompted transfer to Nurse Triage:    Right breast pain but that breast was removed in 2010-2011. It is very tender and hurts. Reason for Disposition  [1] Breast pain AND [2] cause is not known  Answer Assessment - Initial Assessment Questions 1. SYMPTOM: What's the main symptom you're concerned about?  (e.g., lump, pain, rash, nipple discharge)     Tenderness, pain. Patient denies any redness, swelling or discharge.  2. LOCATION: Where is the tenderness/pain located?     Right breast, just above the breast.  3. ONSET: When did pain  start?     X few months. Seen in the office on 02/19/24, states symptoms are about the same.  4. PRIOR HISTORY: Do you have any history of prior problems with your breasts? (e.g., lumps, cancer, fibrocystic breast disease)     Malignant neoplasm of breast.  5. CAUSE: What do you think is causing this symptom?     Patient states the provider did a breast exam and told her she did not feel anything abnormal. Patient states she did not follow up with the plastic surgeon as she wanted to see Dr Myrla first.  6. OTHER SYMPTOMS: Do you have any other symptoms? (e.g., fever, breast pain, redness or rash, nipple discharge)     No.  7. PREGNANCY-BREASTFEEDING: Is there any chance you are  pregnant? When was your last menstrual period? Are you breastfeeding?     N/A.  Protocols used: Breast Symptoms-A-AH

## 2024-03-08 NOTE — Progress Notes (Signed)
  Subjective:  Patient ID: Kristen Hays, female    DOB: Aug 11, 1946,  MRN: [id number]  Chief Complaint  Patient presents with   Foot Pain    Pt is here due to right foot pain states no injury to foot pain began about a month ago states when she made appointment she could not put weight on foot states its better now but still wants to get it checked out.    78 y.o. female presents with the above complaint.  Patient presents with right ankle pain.  She denies any injury to it.  She states it happened about a month ago.  She states is doing better now but wanted to get it checked out hurts with ambulation is with pressure she has not seen anyone else prior to seeing me for this pain scale is 2 out of 10 dull aching nature at this time.  She states it got up to 7 out of 10 especially with ambulation.  Denies any other acute issues   Review of Systems: Negative except as noted in the HPI. Denies N/V/F/Ch.  Past Medical History:  Diagnosis Date   Anxiety    Barrett esophagus 2016   Breast cancer (HCC)    right breast c Mastectomy    Cataract had removed   Digital mucinous cyst of finger 06/12/2022   Dysphagia    GERD (gastroesophageal reflux disease)    High cholesterol    Hypothyroidism    Sleep apnea use c pap    Current Outpatient Medications:    pantoprazole (PROTONIX) 40 MG tablet, Take 40 mg by mouth 2 (two) times daily before a meal. , Disp: , Rfl:    sertraline  (ZOLOFT ) 100 MG tablet, TAKE 1 TABLET BY MOUTH EVERY DAY, Disp: 90 tablet, Rfl: 1  Social History   Tobacco Use  Smoking Status Never  Smokeless Tobacco Never    No Known Allergies Objective:  There were no vitals filed for this visit. Body mass index is 25.33 kg/m. Constitutional Well developed. Well nourished.  Vascular Dorsalis pedis pulses palpable bilaterally. Posterior tibial pulses palpable bilaterally. Capillary refill normal to all digits.  No cyanosis or clubbing noted. Pedal hair growth normal.   Neurologic Normal speech. Oriented to person, place, and time. Epicritic sensation to light touch grossly present bilaterally.  Dermatologic Nails well groomed and normal in appearance. No open wounds. No skin lesions.  Orthopedic: Pain on palpation right ankle joint pain with range of motion of the joint no deep intra-articular pain noted no crepitus noted.  No pain at the Achilles tendon posterior tibial tendon peroneal tendon   Radiographs: 3 views of skeletally mature the right foot: Midfoot arthritis noted.  Plantar and posterior heel spurring noted.  Os peroneum noted.  Mild hallux limitus noted.  No other bony abnormalities identified pes planovalgus foot structure noted.  No ankle arthritis noted Assessment:   1. Capsulitis of right ankle    Plan:  Patient was evaluated and treated and all questions answered.  Right ankle capsulitis - All questions and concerns were discussed with the patient in extensive detail given the amount of pain that she is experiencing she will benefit from steroid injection help decrease acute inflammatory component associate with pain.  Patient agrees with plan like to proceed with steroid injection -A steroid injection was performed at right ankle using 1% plain Lidocaine  and 10 mg of Kenalog . This was well tolerated.   No follow-ups on file.

## 2024-03-09 NOTE — Telephone Encounter (Signed)
Pt advised & verbalized understanding

## 2024-03-10 DIAGNOSIS — L409 Psoriasis, unspecified: Secondary | ICD-10-CM | POA: Diagnosis not present

## 2024-03-10 DIAGNOSIS — H60501 Unspecified acute noninfective otitis externa, right ear: Secondary | ICD-10-CM | POA: Diagnosis not present

## 2024-03-11 ENCOUNTER — Ambulatory Visit: Admitting: Plastic Surgery

## 2024-03-11 VITALS — BP 130/75 | HR 77 | Ht 60.0 in | Wt 138.0 lb

## 2024-03-11 DIAGNOSIS — C50911 Malignant neoplasm of unspecified site of right female breast: Secondary | ICD-10-CM

## 2024-03-11 DIAGNOSIS — M545 Low back pain, unspecified: Secondary | ICD-10-CM

## 2024-03-11 DIAGNOSIS — K2270 Barrett's esophagus without dysplasia: Secondary | ICD-10-CM

## 2024-03-11 DIAGNOSIS — K449 Diaphragmatic hernia without obstruction or gangrene: Secondary | ICD-10-CM

## 2024-03-11 DIAGNOSIS — Z9882 Breast implant status: Secondary | ICD-10-CM | POA: Diagnosis not present

## 2024-03-11 DIAGNOSIS — M51369 Other intervertebral disc degeneration, lumbar region without mention of lumbar back pain or lower extremity pain: Secondary | ICD-10-CM

## 2024-03-11 DIAGNOSIS — N644 Mastodynia: Secondary | ICD-10-CM | POA: Diagnosis not present

## 2024-03-11 NOTE — Progress Notes (Signed)
 Patient ID: Kristen Hays, female    DOB: 14-Jan-1946, 78 y.o.   MRN: [id number]   Chief Complaint  Patient presents with   Advice Only    The patient is a 78 year old female here for evaluation of her implants.  She underwent a right mastectomy for breast cancer in 2010.  She had implants placed on both sides but it looks like the timing is such that in total she has had 3 implants.  She cannot remember if she had them exchanged or not.  She has a card for 550 cc on the right and then a card for 660 cc on the right and 330 cc on the left they are saline implants.  Patient is concerned because she is having some discomfort in the right breast that feels like pulling.  She is worried that the implants have been in too long and may need to have them exchanged and this may be the case.  She is 5 feet tall and weighs 138 pounds.  She is going to check her records at home and see if she can figure out what she currently has in at this time.  Do not feel any areas of concern.    Review of Systems  Constitutional: Negative.   Eyes: Negative.   Respiratory:  Positive for chest tightness.   Cardiovascular: Negative.   Gastrointestinal: Negative.   Endocrine: Negative.   Genitourinary: Negative.   Musculoskeletal: Negative.     Past Medical History:  Diagnosis Date   Anxiety    Barrett esophagus 2016   Breast cancer [facility])    right breast c Mastectomy    Cataract had removed   Digital mucinous cyst of finger 06/12/2022   Dysphagia    GERD (gastroesophageal reflux disease)    High cholesterol    Hypothyroidism    Sleep apnea use c pap    Past Surgical History:  Procedure Laterality Date   AUGMENTATION MAMMAPLASTY Bilateral    BREAST SURGERY     mastectomy-right; breast augmentation   CATARACT EXTRACTION W/PHACO Left 07/21/2016   Procedure: CATARACT EXTRACTION PHACO AND INTRAOCULAR LENS PLACEMENT (IOC);  Surgeon: Donzell Arlyce Budd, MD;  Location: [facility];   Service: Ophthalmology;  Laterality: Left;  LEFT   CESAREAN SECTION     1974/1979   COLONOSCOPY N/A 01/17/2015   Procedure: COLONOSCOPY;  Surgeon: Lamar ONEIDA Holmes, MD;  Location: [facility] END[facility]COPY;  Service: Endoscopy;  Laterality: N/A;   ESOPHAGOGASTRODUODENOSCOPY N/A 01/17/2015   Procedure: ESOPHAGOGASTRODUODENOSCOPY (EGD);  Surgeon: Lamar ONEIDA Holmes, MD;  Location: [facility] ENDOSCOPY;  Service: Endoscopy;  Laterality: N/A;   ESOPHAGOGASTRODUODENOSCOPY (EGD) WITH PROPOFOL  N/A 02/17/2018   Procedure: ESOPHAGOGASTRODUODENOSCOPY (EGD) WITH PROPOFOL ;  Surgeon: Holmes Lamar ONEIDA, MD;  Location: [facility] ENDOSCOPY;  Service: Endoscopy;  Laterality: N/A;   EYE SURGERY Right 08/2014   MASTECTOMY Right 2011   SAVORY DILATION N/A 01/17/2015   Procedure: SAVORY DILATION;  Surgeon: Lamar ONEIDA Holmes, MD;  Location: [facility] ENDOSCOPY;  Service: Endoscopy;  Laterality: N/A;   TONSILLECTOMY  2011      Current Outpatient Medications:    pantoprazole (PROTONIX) 40 MG tablet, Take 40 mg by mouth 2 (two) times daily before a meal. , Disp: , Rfl:    sertraline  (ZOLOFT ) 100 MG tablet, TAKE 1 TABLET BY MOUTH EVERY DAY, Disp: 90 tablet, Rfl: 1   Objective:   Vitals:   03/11/24 1443  BP: 130/75  Pulse: 77  SpO2: 96%    Physical Exam Vitals reviewed.  Constitutional:      Appearance: Normal appearance.  HENT:     Head: Normocephalic and atraumatic.  Cardiovascular:     Rate and Rhythm: Normal rate.     Pulses: Normal pulses.  Pulmonary:     Effort: Pulmonary effort is normal.  Abdominal:     Palpations: Abdomen is soft.  Skin:    General: Skin is warm.     Capillary Refill: Capillary refill takes less than 2 seconds.  Neurological:     Mental Status: She is alert and oriented to person, place, and time.  Psychiatric:        Mood and Affect: Mood normal.        Behavior: Behavior normal.        Thought Content: Thought content normal.        Judgment: Judgment normal.     Assessment & Plan:   Barrett's esophagus without dysplasia  Bergmann's syndrome  Degeneration of intervertebral disc of lumbar region, unspecified whether pain present  Malignant neoplasm of right female breast, unspecified estrogen receptor status, unspecified site of breast (HCC)  Chronic bilateral low back pain, unspecified whether sciatica present  I think the first thing is to evaluate the right breast to be sure that there is not an issue since she is a breast cancer patient.  Since she has no breast and just the implant we will do an ultrasound.  If we need to we can then do an MRI.  Will plan to do a televisit after.  Pictures were obtained of the patient and placed in the chart with the patient's or guardian's permission.   Estefana RAMAN Cannon Arreola, DO

## 2024-03-14 ENCOUNTER — Encounter: Payer: Self-pay | Admitting: Plastic Surgery

## 2024-03-14 DIAGNOSIS — M545 Low back pain, unspecified: Secondary | ICD-10-CM

## 2024-03-14 DIAGNOSIS — C50911 Malignant neoplasm of unspecified site of right female breast: Secondary | ICD-10-CM

## 2024-03-14 DIAGNOSIS — M51369 Other intervertebral disc degeneration, lumbar region without mention of lumbar back pain or lower extremity pain: Secondary | ICD-10-CM

## 2024-03-15 DIAGNOSIS — H6123 Impacted cerumen, bilateral: Secondary | ICD-10-CM | POA: Diagnosis not present

## 2024-03-15 DIAGNOSIS — H60333 Swimmer's ear, bilateral: Secondary | ICD-10-CM | POA: Diagnosis not present

## 2024-03-17 DIAGNOSIS — H60332 Swimmer's ear, left ear: Secondary | ICD-10-CM | POA: Diagnosis not present

## 2024-03-21 ENCOUNTER — Encounter: Payer: Self-pay | Admitting: Family Medicine

## 2024-03-31 DIAGNOSIS — H60332 Swimmer's ear, left ear: Secondary | ICD-10-CM | POA: Diagnosis not present

## 2024-03-31 DIAGNOSIS — H903 Sensorineural hearing loss, bilateral: Secondary | ICD-10-CM | POA: Diagnosis not present

## 2024-04-05 DIAGNOSIS — R140 Abdominal distension (gaseous): Secondary | ICD-10-CM | POA: Diagnosis not present

## 2024-04-05 DIAGNOSIS — R09A2 Foreign body sensation, throat: Secondary | ICD-10-CM | POA: Diagnosis not present

## 2024-04-05 DIAGNOSIS — K224 Dyskinesia of esophagus: Secondary | ICD-10-CM | POA: Diagnosis not present

## 2024-04-05 DIAGNOSIS — K5909 Other constipation: Secondary | ICD-10-CM | POA: Diagnosis not present

## 2024-04-05 DIAGNOSIS — K219 Gastro-esophageal reflux disease without esophagitis: Secondary | ICD-10-CM | POA: Diagnosis not present

## 2024-04-12 ENCOUNTER — Telehealth: Admitting: Plastic Surgery

## 2024-04-14 ENCOUNTER — Telehealth: Payer: Self-pay

## 2024-04-14 NOTE — Telephone Encounter (Signed)
 Faxed a Release of Information form to Dr. Reeda for reconstruction records with confirmed receipt.

## 2024-04-19 ENCOUNTER — Encounter: Payer: Self-pay | Admitting: Family Medicine

## 2024-04-19 ENCOUNTER — Ambulatory Visit (INDEPENDENT_AMBULATORY_CARE_PROVIDER_SITE_OTHER): Admitting: Family Medicine

## 2024-04-19 ENCOUNTER — Other Ambulatory Visit: Payer: Self-pay | Admitting: Family Medicine

## 2024-04-19 VITALS — BP 124/66 | HR 68 | Ht 60.0 in | Wt 137.0 lb

## 2024-04-19 DIAGNOSIS — Z853 Personal history of malignant neoplasm of breast: Secondary | ICD-10-CM

## 2024-04-19 DIAGNOSIS — Z1231 Encounter for screening mammogram for malignant neoplasm of breast: Secondary | ICD-10-CM

## 2024-04-19 DIAGNOSIS — Z9011 Acquired absence of right breast and nipple: Secondary | ICD-10-CM

## 2024-04-19 DIAGNOSIS — Z9889 Other specified postprocedural states: Secondary | ICD-10-CM

## 2024-04-19 DIAGNOSIS — N644 Mastodynia: Secondary | ICD-10-CM

## 2024-04-19 NOTE — Progress Notes (Signed)
 Acute visit   Patient: Kristen Hays   DOB: 07/06/1946   77 y.o. Female  MRN: [id number] PCP: Myrla Jon HERO, MD   Chief Complaint  Patient presents with   Breast Pain    Off and on R side breast pain, now the pain has subsided    Subjective    Discussed the use of AI scribe software for clinical note transcription with the patient, who gave verbal consent to proceed.  History of Present Illness   Kristen Hays is a 78 year old female with a history of right breast cancer and mastectomy who presents with right breast pain.  She experiences intermittent pain in the right breast area, specifically at the top, which was severe enough to impact her ability to walk during [place]. Currently, the pain is not present. No lumps have been felt in the breast area during the episodes of pain. An ultrasound was suggested but not performed due to scheduling issues related to her mammogram timing.  She underwent a mastectomy with reconstruction and is dissatisfied with the reconstruction, noting asymmetry and discomfort, particularly with the right breast implant area. She has an implant on the left side as well, placed for symmetry. No additional surgeries or implant exchanges have occurred since the initial reconstruction. During the review of symptoms, no lumps or other concerning symptoms were reported in the breast area. The pain could have been muscular, as it was associated with physical activity.        Review of Systems  Objective    BP 124/66   Pulse 68   Ht 5' (1.524 m)   Wt 137 lb (62.1 kg)   SpO2 98%   BMI 26.76 kg/m  Physical Exam   Physical Exam   BREAST: Breasts are without palpable lumps, including around the edges. Bilateral implants, s/p R mastectomy      No results found for any visits on 04/19/24.  Assessment & Plan     Problem List Items Addressed This Visit       Other   History of cancer of right breast - Primary   Relevant  Orders   US  LIMITED ULTRASOUND INCLUDING AXILLA RIGHT BREAST   Acquired absence of right breast and nipple   Relevant Orders   US  LIMITED ULTRASOUND INCLUDING AXILLA RIGHT BREAST   History of reconstruction of right breast   Relevant Orders   US  LIMITED ULTRASOUND INCLUDING AXILLA RIGHT BREAST   Other Visit Diagnoses       Breast pain, right       Relevant Orders   US  LIMITED ULTRASOUND INCLUDING AXILLA RIGHT BREAST     Breast cancer screening by mammogram       Relevant Orders   MM 3D SCREENING MAMMOGRAM UNILATERAL LEFT BREAST W/IMPLANT           Right breast pain after mastectomy with implant Intermittent right breast pain post-mastectomy with implant, currently resolved. Pain was severe during physical activity, particularly walking, and was located in the area of the right breast implant. No palpable lumps. Previous evaluation by Dr. Lowery suggested no immediate concerns, but an ultrasound was recommended. Insurance issues delayed imaging. Differential diagnosis includes implant-related issues and possible muscular pain due to physical activity. - Order ultrasound of the right breast, including axilla. - Order screening mammogram of the left breast. - Coordinate scheduling of both imaging studies at [facility] - Advise her to contact [place] if not contacted by  the end of the week. - Discuss potential for explantation with Dr. Lowery if discomfort persists.       No orders of the defined types were placed in this encounter.    Return if symptoms worsen or fail to improve.      Jon Eva, MD  [facility] [phone] (phone) [phone] (fax)  [facility]

## 2024-04-26 ENCOUNTER — Ambulatory Visit: Payer: Self-pay | Admitting: Family Medicine

## 2024-04-26 ENCOUNTER — Ambulatory Visit
Admission: RE | Admit: 2024-04-26 | Discharge: 2024-04-26 | Disposition: A | Discharge status: Home / Self Care | Source: Ambulatory Visit | Attending: Family Medicine | Admitting: Family Medicine

## 2024-04-26 DIAGNOSIS — Z9011 Acquired absence of right breast and nipple: Secondary | ICD-10-CM | POA: Diagnosis not present

## 2024-04-26 DIAGNOSIS — Z1231 Encounter for screening mammogram for malignant neoplasm of breast: Secondary | ICD-10-CM | POA: Insufficient documentation

## 2024-04-26 DIAGNOSIS — Z9889 Other specified postprocedural states: Secondary | ICD-10-CM | POA: Diagnosis not present

## 2024-04-26 DIAGNOSIS — N644 Mastodynia: Secondary | ICD-10-CM | POA: Insufficient documentation

## 2024-04-26 DIAGNOSIS — Z853 Personal history of malignant neoplasm of breast: Secondary | ICD-10-CM

## 2024-06-10 ENCOUNTER — Telehealth: Payer: Self-pay

## 2024-06-10 NOTE — Progress Notes (Signed)
 Pharmacy Quality Measure Review  This patient is appearing on a report for being at risk of failing the adherence measure for cholesterol (statin) medications this calendar year.   Medication: atorvastatin  Last fill date: 01/28/24 for 90 day supply  No refills left at the pharmacy. Will collaborate with provider to facilitate refill needs.   Tymika Grilli E. Marsh, PharmD Clinical Pharmacist [facility] [phone]

## 2024-06-14 MED ORDER — ATORVASTATIN CALCIUM 10 MG PO TABS: 10.0000 mg | ORAL_TABLET | Freq: Every day | ORAL | 2 refills | Status: DC

## 2024-06-15 ENCOUNTER — Ambulatory Visit: Payer: HMO | Admitting: Emergency Medicine

## 2024-06-15 VITALS — Ht 63.0 in | Wt 136.0 lb

## 2024-06-15 DIAGNOSIS — Z0000 Encounter for general adult medical examination without abnormal findings: Secondary | ICD-10-CM | POA: Diagnosis not present

## 2024-06-15 NOTE — Progress Notes (Signed)
 Subjective:   Kristen Hays is a 78 y.o. who presents for a Medicare Wellness preventive visit.  As a reminder, Annual Wellness Visits don't include a physical exam, and some assessments may be limited, especially if this visit is performed virtually. We may recommend an in-person follow-up visit with your provider if needed.  Visit Complete: Virtual I connected with  Kristen Hays on 06/15/24 by a audio enabled telemedicine application and verified that I am speaking with the correct person using two identifiers.  Patient Location: Home  Provider Location: Home Office  I discussed the limitations of evaluation and management by telemedicine. The patient expressed understanding and agreed to proceed.  Vital Signs: Because this visit was a virtual/telehealth visit, some criteria may be missing or patient reported. Any vitals not documented were not able to be obtained and vitals that have been documented are patient reported.  VideoDeclined- This patient declined Librarian, academic. Therefore the visit was completed with audio only.  Persons Participating in Visit: Patient.  AWV Questionnaire: Yes: Patient Medicare AWV questionnaire was completed by the patient on 06/13/24; I have confirmed that all information answered by patient is correct and no changes since this date.  Cardiac Risk Factors include: advanced age (>69men, >61 women);dyslipidemia     Objective:    Today's Vitals   06/15/24 1128 06/15/24 1129  Weight: 136 lb (61.7 kg)   Height: 5' 3 (1.6 m)   PainSc:  4    Body mass index is 24.09 kg/m.     06/15/2024   11:37 AM 06/09/2023    9:19 AM 06/02/2022    9:18 AM 08/08/2021   11:48 AM 05/31/2021    3:17 PM 04/11/2020   11:45 AM 01/23/2020   11:18 AM  Advanced Directives  Does Patient Have a Medical Advance Directive? Yes No No Yes Yes Yes Yes  Type of Estate agent of [place];Living will    Living will  Healthcare Power of [place];Living will Healthcare Power of [place];Living will  Does patient want to make changes to medical advance directive? No - Patient declined   No - Patient declined No - Patient declined  Yes (Inpatient - patient defers changing a medical advance directive and declines information at this time)  Copy of Healthcare Power of Attorney in Chart? No - copy requested     No - copy requested No - copy requested  Would patient like information on creating a medical advance directive?   No - Patient declined    No - Patient declined    Current Medications (verified) Outpatient Encounter Medications as of 06/15/2024  Medication Sig   atorvastatin  (LIPITOR) 10 MG tablet Take 1 tablet (10 mg total) by mouth daily.   Cyanocobalamin (VITAMIN B-12 CR) 1000 MCG TBCR Take 1 tablet by mouth daily.   linaclotide (LINZESS) 72 MCG capsule Take 72 mcg by mouth.   pantoprazole (PROTONIX) 40 MG tablet Take 40 mg by mouth 2 (two) times daily before a meal.    sertraline  (ZOLOFT ) 100 MG tablet TAKE 1 TABLET BY MOUTH EVERY DAY   ciprofloxacin (CIPRO) 500 MG tablet Take 500 mg by mouth 2 (two) times daily. (Patient not taking: Reported on 06/15/2024)   No facility-administered encounter medications on file as of 06/15/2024.    Allergies (verified) Patient has no known allergies.   History: Past Medical History:  Diagnosis Date   Anxiety    Barrett esophagus 2016   Breast cancer (HCC)  right breast c Mastectomy    Cataract had removed   Digital mucinous cyst of finger 06/12/2022   Dysphagia    GERD (gastroesophageal reflux disease)    High cholesterol    Hypothyroidism    Sleep apnea use c pap   Past Surgical History:  Procedure Laterality Date   AUGMENTATION MAMMAPLASTY Bilateral    BREAST SURGERY     mastectomy-right; breast augmentation   CATARACT EXTRACTION W/PHACO Left 07/21/2016   Procedure: CATARACT EXTRACTION PHACO AND INTRAOCULAR LENS PLACEMENT (IOC);  Surgeon:  Donzell Arlyce Budd, MD;  Location: [facility];  Service: Ophthalmology;  Laterality: Left;  LEFT   CESAREAN SECTION     1974/1979   COLONOSCOPY N/A 01/17/2015   Procedure: COLONOSCOPY;  Surgeon: Lamar ONEIDA Holmes, MD;  Location: [facility];  Service: Endoscopy;  Laterality: N/A;   ESOPHAGOGASTRODUODENOSCOPY N/A 01/17/2015   Procedure: ESOPHAGOGASTRODUODENOSCOPY (EGD);  Surgeon: Lamar ONEIDA Holmes, MD;  Location[facility];  Service: Endoscopy;  Laterality: N/A;   ESOPHAGOGASTRODUODENOSCOPY (EGD) WITH PROPOFOL  N/A 02/17/2018   Procedure: ESOPHAGOGASTRODUODENOSCOPY (EGD) WITH PROPOFOL ;  Surgeon: Holmes Lamar ONEIDA, MD;  Location: [facility] ENDOSCOPY;  Service: Endoscopy;  Laterality: N/A;   EYE SURGERY Right 08/2014   MASTECTOMY Right 2011   SAVORY DILATION N/A 01/17/2015   Procedure: SAVORY DILATION;  Surgeon: Lamar ONEIDA Holmes, MD;  Location: [facility] ENDOSCOPY;  Service: Endoscopy;  Laterality: N/A;   TONSILLECTOMY  2011   Family History  Problem Relation Age of Onset   Diabetes Mother    Stroke Mother    Alzheimer's disease Mother    Bone cancer Father    Vascular Disease Father    Cancer Sister        breast   Breast cancer Sister    Irritable bowel syndrome Sister    Diabetes Sister    Healthy Brother    Healthy Sister    Breast cancer Paternal Aunt    Breast cancer Cousin    Social History   Socioeconomic History   Marital status: Married    Spouse name: Ryan   Number of children: 2   Years of education: HS   Highest education level: 12th grade  Occupational History   Occupation: retired    Associate Professor: [employer]  Tobacco Use   Smoking status: Never   Smokeless tobacco: Never  Vaping Use   Vaping status: Never Used  Substance and Sexual Activity   Alcohol use: No   Drug use: No   Sexual activity: Yes    Birth control/protection: Post-menopausal  Other Topics Concern   Not on file  Social History Narrative   Not on file   Social  Drivers of Health   Financial Resource Strain: Low Risk  (06/15/2024)   Overall Financial Resource Strain (CARDIA)    Difficulty of Paying Living Expenses: Not hard at all  Food Insecurity: No Food Insecurity (06/15/2024)   Hunger Vital Sign    Worried About Running Out of Food in the Last Year: Never true    Ran Out of Food in the Last Year: Never true  Transportation Needs: No Transportation Needs (06/15/2024)   PRAPARE - Administrator, Civil Service (Medical): No    Lack of Transportation (Non-Medical): No  Physical Activity: Insufficiently Active (06/15/2024)   Exercise Vital Sign    Days of Exercise per Week: 1 day    Minutes of Exercise per Session: 20 min  Stress: No Stress Concern Present (06/15/2024)   [employer] of Occupational Health -  Occupational Stress Questionnaire    Feeling of Stress: Only a little  Social Connections: Moderately Integrated (06/15/2024)   Social Connection and Isolation Panel    Frequency of Communication with Friends and Family: Three times a week    Frequency of Social Gatherings with Friends and Family: Once a week    Attends Religious Services: More than 4 times per year    Active Member of [employer] or Organizations: No    Attends Engineer, structural: Never    Marital Status: Married    Tobacco Counseling Counseling given: Not Answered    Clinical Intake:  Pre-visit preparation completed: Yes  Pain : 0-10 Pain Score: 4  Pain Type: Chronic pain Pain Location: Head Pain Descriptors / Indicators: Headache     BMI - recorded: 24.09 Nutritional Status: BMI of 19-24  Normal Nutritional Risks: None Diabetes: No  Lab Results  Component Value Date   HGBA1C 5.5 05/25/2020   HGBA1C 5.5 02/11/2019   HGBA1C 5.5 01/08/2017     How often do you need to have someone help you when you read instructions, pamphlets, or other written materials from your doctor or pharmacy?: 1 - Never  Interpreter Needed?:  No  Information entered by :: Vina Ned, CMA   Activities of Daily Living     06/15/2024   11:30 AM 06/13/2024    1:52 PM  In your present state of health, do you have any difficulty performing the following activities:  Hearing? 0 0  Vision? 0 0  Difficulty concentrating or making decisions? 0   Walking or climbing stairs? 0   Dressing or bathing? 0   Doing errands, shopping? 0 0  Preparing Food and eating ? N N  Using the Toilet? N N  In the past six months, have you accidently leaked urine? N N  Do you have problems with loss of bowel control? N N  Managing your Medications? N N  Managing your Finances? N N  Housekeeping or managing your Housekeeping? N N    Patient Care Team: Myrla Jon HERO, MD as PCP - General (Family Medicine) Jaye Fallow, MD as Referring Physician (Ophthalmology) Jane Delmar Pike, NP as Nurse Practitioner (Gastroenterology) Dillingham, Estefana RAMAN, DO as Attending Physician (Plastic Surgery) Tobie Franky SQUIBB, DPM as Consulting Physician (Podiatry) Florencio Cara BIRCH, MD as Consulting Physician (Cardiology)  I have updated your Care Teams any recent Medical Services you may have received from other providers in the past year.     Assessment:   This is a routine wellness examination for Kristen Hays.  Hearing/Vision screen Hearing Screening - Comments:: Denies hearing loss  Vision Screening - Comments:: Gets routine eye exams, Dr. Jaye Jacobs Pyatt   Goals Addressed               This Visit's Progress     Increase physical activity (pt-stated)         Depression Screen     06/15/2024   11:35 AM 04/19/2024    8:46 AM 02/19/2024    8:15 AM 06/09/2023    9:16 AM 01/08/2023   11:04 AM 12/09/2022    9:34 AM 07/15/2022    9:54 AM  PHQ 2/9 Scores  PHQ - 2 Score 0 0 0 0 0 1 0  PHQ- 9 Score 1 0 0  0 4 1    Fall Risk     06/15/2024   11:38 AM 06/13/2024    1:52 PM 06/09/2023    7:37 AM 01/08/2023  11:04 AM 12/09/2022     9:34 AM  Fall Risk   Falls in the past year? 0 0 0 0 1  Number falls in past yr: 0  0 0 0  Injury with Fall? 0  0 0 0  Risk for fall due to : No Fall Risks  No Fall Risks No Fall Risks History of fall(s)  Follow up Falls evaluation completed  Education provided;Falls prevention discussed Falls evaluation completed Falls evaluation completed    MEDICARE RISK AT HOME:  Medicare Risk at Home Any stairs in or around the home?: Yes If so, are there any without handrails?: No Home free of loose throw rugs in walkways, pet beds, electrical cords, etc?: Yes Adequate lighting in your home to reduce risk of falls?: Yes Life alert?: No Use of a cane, walker or w/c?: No Grab bars in the bathroom?: No Shower chair or bench in shower?: No Elevated toilet seat or a handicapped toilet?: No  TIMED UP AND GO:  Was the test performed?  No  Cognitive Function: 6CIT completed        06/15/2024   11:39 AM 06/09/2023    9:20 AM 06/02/2022    9:20 AM 05/31/2021    3:19 PM 02/11/2019   11:24 AM  6CIT Screen  What Year? 0 points 0 points 0 points 0 points 0 points  What month? 0 points 0 points 0 points 0 points 0 points  What time? 0 points 0 points 0 points 0 points 0 points  Count back from 20 0 points 0 points 0 points 0 points 0 points  Months in reverse 0 points 0 points 0 points 0 points 0 points  Repeat phrase 0 points 0 points 0 points 0 points 2 points  Total Score 0 points 0 points 0 points 0 points 2 points    Immunizations Immunization History  Administered Date(s) Administered   Fluad Quad(high Dose 65+) 05/25/2020, 05/12/2022   Fluad Trivalent(High Dose 65+) 06/06/2023   INFLUENZA, HIGH DOSE SEASONAL PF 06/06/2015, 06/07/2018, 04/21/2019, 04/21/2019, 06/23/2021, 06/01/2024   Influenza Split 06/15/2012   PFIZER(Purple Top)SARS-COV-2 Vaccination 10/11/2019, 11/01/2019, 09/08/2020   Pneumococcal Conjugate-13 12/09/2013   Pneumococcal Polysaccharide-23 01/07/2016   Zoster  Recombinant(Shingrix) 01/07/2021, 04/17/2021    Screening Tests Health Maintenance  Topic Date Due   DTaP/Tdap/Td (1 - Tdap) Never done   COVID-19 Vaccine (4 - 2025-26 season) 05/02/2024   Mammogram  04/26/2025   Medicare Annual Wellness (AWV)  06/15/2025   DEXA SCAN  06/26/2026   Pneumococcal Vaccine: 50+ Years  Completed   Influenza Vaccine  Completed   Hepatitis C Screening  Completed   Zoster Vaccines- Shingrix  Completed   Meningococcal B Vaccine  Aged Out   Colonoscopy  Discontinued    Health Maintenance Items Addressed: See Nurse Notes at the end of this note  Additional Screening:  Vision Screening: Recommended annual ophthalmology exams for early detection of glaucoma and other disorders of the eye. Is the patient up to date with their annual eye exam?  Yes  Who is the provider or what is the name of the office in which the patient attends annual eye exams? Dr. Jaye @ [facility]  Dental Screening: Recommended annual dental exams for proper oral hygiene  Community Resource Referral / Chronic Care Management: CRR required this visit?  Yes   CCM required this visit?  No   Plan:    I have personally reviewed and noted the following in the patient's chart:  Medical and social history Use of alcohol, tobacco or illicit drugs  Current medications and supplements including opioid prescriptions. Patient is not currently taking opioid prescriptions. Functional ability and status Nutritional status Physical activity Advanced directives List of other physicians Hospitalizations, surgeries, and ER visits in previous 12 months Vitals Screenings to include cognitive, depression, and falls Referrals and appointments  In addition, I have reviewed and discussed with patient certain preventive protocols, quality metrics, and best practice recommendations. A written personalized care plan for preventive services as well as general preventive health  recommendations were provided to patient.   Vina Ned, CMA   06/15/2024   After Visit Summary: (MyChart) Due to this being a telephonic visit, the after visit summary with patients personalized plan was offered to patient via MyChart   Notes:  Declined Tdap vaccine Wishes to discuss Covid vaccine at next OV on 07/19/24 Screening colonoscopy no longer recommended due to age.

## 2024-06-15 NOTE — Patient Instructions (Signed)
 Kristen Hays,  Thank you for taking the time for your Medicare Wellness Visit. I appreciate your continued commitment to your health goals. Please review the care plan we discussed, and feel free to reach out if I can assist you further.  Medicare recommends these wellness visits once per year to help you and your care team stay ahead of potential health issues. These visits are designed to focus on prevention, allowing your provider to concentrate on managing your acute and chronic conditions during your regular appointments.  Please note that Annual Wellness Visits do not include a physical exam. Some assessments may be limited, especially if the visit was conducted virtually. If needed, we may recommend a separate in-person follow-up with your provider.  Ongoing Care Seeing your primary care provider every 3 to 6 months helps us  monitor your health and provide consistent, personalized care.   Referrals If a referral was made during today's visit and you haven't received any updates within two weeks, please contact the referred provider directly to check on the status.  Recommended Screenings: Discuss the need for a covid vaccine with you doctor at your next OV on 07/19/24.   Health Maintenance  Topic Date Due   DTaP/Tdap/Td vaccine (1 - Tdap) Never done   COVID-19 Vaccine (4 - 2025-26 season) 05/02/2024   Breast Cancer Screening  04/26/2025   Medicare Annual Wellness Visit  06/15/2025   DEXA scan (bone density measurement)  06/26/2026   Pneumococcal Vaccine for age over 68  Completed   Flu Shot  Completed   Hepatitis C Screening  Completed   Zoster (Shingles) Vaccine  Completed   Meningitis B Vaccine  Aged Out   Colon Cancer Screening  Discontinued       06/15/2024   11:37 AM  Advanced Directives  Does Patient Have a Medical Advance Directive? Yes  Type of Estate agent of [place];Living will  Does patient want to make changes to medical advance  directive? No - Patient declined  Copy of Healthcare Power of Attorney in Chart? No - copy requested   Advance Care Planning is important because it: Ensures you receive medical care that aligns with your values, goals, and preferences. Provides guidance to your family and loved ones, reducing the emotional burden of decision-making during critical moments.  Vision: Annual vision screenings are recommended for early detection of glaucoma, cataracts, and diabetic retinopathy. These exams can also reveal signs of chronic conditions such as diabetes and high blood pressure.  Dental: Annual dental screenings help detect early signs of oral cancer, gum disease, and other conditions linked to overall health, including heart disease and diabetes.  Please see the attached documents for additional preventive care recommendations.    Fall Prevention in the Home, Adult Falls can cause injuries and affect people of all ages. There are many simple things that you can do to make your home safe and to help prevent falls. If you need it, ask for help making these changes. What actions can I take to prevent falls? General information Use good lighting in all rooms. Make sure to: Replace any light bulbs that burn out. Turn on lights if it is dark and use night-lights. Keep items that you use often in easy-to-reach places. Lower the shelves around your home if needed. Move furniture so that there are clear paths around it. Do not keep throw rugs or other things on the floor that can make you trip. If any of your floors are uneven, fix them. Add  color or contrast paint or tape to clearly mark and help you see: Grab bars or handrails. First and last steps of staircases. Where the edge of each step is. If you use a ladder or stepladder: Make sure that it is fully opened. Do not climb a closed ladder. Make sure the sides of the ladder are locked in place. Have someone hold the ladder while you use  it. Know where your pets are as you move through your home. What can I do in the bathroom?     Keep the floor dry. Clean up any water that is on the floor right away. Remove soap buildup in the bathtub or shower. Buildup makes bathtubs and showers slippery. Use non-skid mats or decals on the floor of the bathtub or shower. Attach bath mats securely with double-sided, non-slip rug tape. If you need to sit down while you are in the shower, use a non-slip stool. Install grab bars by the toilet and in the bathtub and shower. Do not use towel bars as grab bars. What can I do in the bedroom? Make sure that you have a light by your bed that is easy to reach. Do not use any sheets or blankets on your bed that hang to the floor. Have a firm bench or chair with side arms that you can use for support when you get dressed. What can I do in the kitchen? Clean up any spills right away. If you need to reach something above you, use a sturdy step stool that has a grab bar. Keep electrical cables out of the way. Do not use floor polish or wax that makes floors slippery. What can I do with my stairs? Do not leave anything on the stairs. Make sure that you have a light switch at the top and the bottom of the stairs. Have them installed if you do not have them. Make sure that there are handrails on both sides of the stairs. Fix handrails that are broken or loose. Make sure that handrails are as long as the staircases. Install non-slip stair treads on all stairs in your home if they do not have carpet. Avoid having throw rugs at the top or bottom of stairs, or secure the rugs with carpet tape to prevent them from moving. Choose a carpet design that does not hide the edge of steps on the stairs. Make sure that carpet is firmly attached to the stairs. Fix any carpet that is loose or worn. What can I do on the outside of my home? Use bright outdoor lighting. Repair the edges of walkways and driveways and fix  any cracks. Clear paths of anything that can make you trip, such as tools or rocks. Add color or contrast paint or tape to clearly mark and help you see high doorway thresholds. Trim any bushes or trees on the main path into your home. Check that handrails are securely fastened and in good repair. Both sides of all steps should have handrails. Install guardrails along the edges of any raised decks or porches. Have leaves, snow, and ice cleared regularly. Use sand, salt, or ice melt on walkways during winter months if you live where there is ice and snow. In the garage, clean up any spills right away, including grease or oil spills. What other actions can I take? Review your medicines with your health care provider. Some medicines can make you confused or feel dizzy. This can increase your chance of falling. Wear closed-toe shoes that fit  well and support your feet. Wear shoes that have rubber soles and low heels. Use a cane, walker, scooter, or crutches that help you move around if needed. Talk with your provider about other ways that you can decrease your risk of falls. This may include seeing a physical therapist to learn to do exercises to improve movement and strength. Where to find more information Centers for Disease Control and Prevention, STEADI: TonerPromos.no [employer] on Aging: BaseRingTones.pl National Institute on Aging: BaseRingTones.pl Contact a health care provider if: You are afraid of falling at home. You feel weak, drowsy, or dizzy at home. You fall at home. Get help right away if you: Lose consciousness or have trouble moving after a fall. Have a fall that causes a head injury. These symptoms may be an emergency. Get help right away. Call 911. Do not wait to see if the symptoms will go away. Do not drive yourself to the hospital. This information is not intended to replace advice given to you by your health care provider. Make sure you discuss any questions you have with your  health care provider. Document Revised: 04/21/2022 Document Reviewed: 04/21/2022 Elsevier Patient Education  2024 [employer].

## 2024-06-25 ENCOUNTER — Other Ambulatory Visit: Payer: Self-pay | Admitting: Family Medicine

## 2024-06-27 NOTE — Telephone Encounter (Signed)
 Requested Prescriptions  Pending Prescriptions Disp Refills   sertraline  (ZOLOFT ) 100 MG tablet [Pharmacy Med Name: SERTRALINE  HCL 100 MG TABLET] 90 tablet 0    Sig: TAKE 1 TABLET BY MOUTH EVERY DAY     Psychiatry:  Antidepressants - SSRI - sertraline  Passed - 06/27/2024  2:16 PM      Passed - AST in normal range and within 360 days    AST  Date Value Ref Range Status  07/20/2023 12 0 - 40 IU/L Final         Passed - ALT in normal range and within 360 days    ALT  Date Value Ref Range Status  07/20/2023 15 0 - 32 IU/L Final         Passed - Completed PHQ-2 or PHQ-9 in the last 360 days      Passed - Valid encounter within last 6 months    Recent Outpatient Visits           2 months ago History of cancer of right breast   [facility] [place], Jon HERO, MD   4 months ago Other chest pain   [facility] [place], [place], PA-C       Future Appointments             In 3 weeks Bacigalupo, Jon HERO, MD [facility] [place]

## 2024-06-28 ENCOUNTER — Telehealth: Payer: Self-pay

## 2024-06-28 DIAGNOSIS — E7800 Pure hypercholesterolemia, unspecified: Secondary | ICD-10-CM

## 2024-06-28 NOTE — Telephone Encounter (Signed)
 Copied from CRM #[id number]. Topic: Clinical - Request for Lab/Test Order >> Jun 28, 2024 10:12 AM Kristen Hays wrote: Reason for CRM: pt called requesting to have her lab orders placed prior to her physical. She states she has another appt elsewhere that day and wants to come in before her appt to complete her labs

## 2024-06-28 NOTE — Telephone Encounter (Signed)
 Please review  Has appt 07/19/24

## 2024-06-30 NOTE — Telephone Encounter (Signed)
 Ok to order CMP, lipid - use diagnoses HLD from her problem list. Recommend that she get them at least 2 days before her appt with me. Get them fasting. Thanks

## 2024-07-01 NOTE — Addendum Note (Signed)
 Addended by: LILIAN SEVERO RAMAN on: 07/01/2024 11:05 AM   Modules accepted: Orders

## 2024-07-01 NOTE — Telephone Encounter (Signed)
 Orders placed. Detailed vm left per DPR on cell phone. Ok to advise per Dr.B if call is returned

## 2024-07-07 ENCOUNTER — Other Ambulatory Visit
Admission: RE | Admit: 2024-07-07 | Discharge: 2024-07-07 | Disposition: A | Discharge status: Home / Self Care | Source: Ambulatory Visit | Attending: Ophthalmology | Admitting: Ophthalmology

## 2024-07-07 DIAGNOSIS — R300 Dysuria: Secondary | ICD-10-CM | POA: Diagnosis not present

## 2024-07-07 DIAGNOSIS — G4452 New daily persistent headache (NDPH): Secondary | ICD-10-CM | POA: Insufficient documentation

## 2024-07-07 DIAGNOSIS — H04123 Dry eye syndrome of bilateral lacrimal glands: Secondary | ICD-10-CM | POA: Diagnosis not present

## 2024-07-07 DIAGNOSIS — H02403 Unspecified ptosis of bilateral eyelids: Secondary | ICD-10-CM | POA: Diagnosis not present

## 2024-07-07 DIAGNOSIS — N3001 Acute cystitis with hematuria: Secondary | ICD-10-CM | POA: Diagnosis not present

## 2024-07-07 DIAGNOSIS — D3132 Benign neoplasm of left choroid: Secondary | ICD-10-CM | POA: Diagnosis not present

## 2024-07-07 LAB — SEDIMENTATION RATE: Sed Rate: 4 mm/h (ref 0–30)

## 2024-07-07 LAB — CBC
HCT: 44.7 % (ref 36.0–46.0)
Hemoglobin: 14.7 g/dL (ref 12.0–15.0)
MCH: 29.5 pg (ref 26.0–34.0)
MCHC: 32.9 g/dL (ref 30.0–36.0)
MCV: 89.8 fL (ref 80.0–100.0)
Platelets: 226 K/uL (ref 150–400)
RBC: 4.98 MIL/uL (ref 3.87–5.11)
RDW: 12.9 % (ref 11.5–15.5)
WBC: 11.2 K/uL — ABNORMAL HIGH (ref 4.0–10.5)
nRBC: 0 % (ref 0.0–0.2)

## 2024-07-07 LAB — C-REACTIVE PROTEIN: CRP: <0.5 mg/dL (ref <1.0)

## 2024-07-11 DIAGNOSIS — E7800 Pure hypercholesterolemia, unspecified: Secondary | ICD-10-CM | POA: Diagnosis not present

## 2024-07-12 ENCOUNTER — Ambulatory Visit: Payer: Self-pay | Admitting: Family Medicine

## 2024-07-12 DIAGNOSIS — E7800 Pure hypercholesterolemia, unspecified: Secondary | ICD-10-CM

## 2024-07-12 LAB — COMPREHENSIVE METABOLIC PANEL WITH GFR
ALT: 14 IU/L (ref 0–32)
AST: 11 IU/L (ref 0–40)
Albumin: 4.4 g/dL (ref 3.8–4.8)
Alkaline Phosphatase: 59 IU/L (ref 49–135)
BUN/Creatinine Ratio: 18 (ref 12–28)
BUN: 14 mg/dL (ref 8–27)
Bilirubin Total: 0.6 mg/dL (ref 0.0–1.2)
CO2: 23 mmol/L (ref 20–29)
Calcium: 9.5 mg/dL (ref 8.7–10.3)
Chloride: 101 mmol/L (ref 96–106)
Creatinine, Ser: 0.76 mg/dL (ref 0.57–1.00)
Globulin, Total: 2.3 g/dL (ref 1.5–4.5)
Glucose: 100 mg/dL — ABNORMAL HIGH (ref 70–99)
Potassium: 4.2 mmol/L (ref 3.5–5.2)
Sodium: 139 mmol/L (ref 134–144)
Total Protein: 6.7 g/dL (ref 6.0–8.5)
eGFR: 81 mL/min/1.73 (ref >59)

## 2024-07-12 LAB — LIPID PANEL WITH LDL/HDL RATIO
Cholesterol, Total: 236 mg/dL — ABNORMAL HIGH (ref 100–199)
HDL: 46 mg/dL (ref >39)
LDL Chol Calc (NIH): 167 mg/dL — ABNORMAL HIGH (ref 0–99)
LDL/HDL Ratio: 3.6 ratio — ABNORMAL HIGH (ref 0.0–3.2)
Triglycerides: 128 mg/dL (ref 0–149)
VLDL Cholesterol Cal: 23 mg/dL (ref 5–40)

## 2024-07-13 NOTE — Progress Notes (Signed)
 Last read by Holli KATHEE Pellet at 2:06PM on 07/12/2024.

## 2024-07-14 ENCOUNTER — Ambulatory Visit (INDEPENDENT_AMBULATORY_CARE_PROVIDER_SITE_OTHER): Admitting: Physician Assistant

## 2024-07-14 ENCOUNTER — Encounter: Payer: Self-pay | Admitting: Physician Assistant

## 2024-07-14 VITALS — BP 114/71 | HR 67 | Resp 14 | Ht 63.0 in | Wt 138.1 lb

## 2024-07-14 DIAGNOSIS — N3281 Overactive bladder: Secondary | ICD-10-CM

## 2024-07-14 DIAGNOSIS — R300 Dysuria: Secondary | ICD-10-CM

## 2024-07-14 DIAGNOSIS — K5909 Other constipation: Secondary | ICD-10-CM | POA: Diagnosis not present

## 2024-07-14 DIAGNOSIS — R319 Hematuria, unspecified: Secondary | ICD-10-CM | POA: Diagnosis not present

## 2024-07-14 LAB — POCT URINALYSIS DIPSTICK
Bilirubin, UA: NEGATIVE
Glucose, UA: NEGATIVE
Ketones, UA: NEGATIVE
Leukocytes, UA: NEGATIVE
Nitrite, UA: NEGATIVE
Protein, UA: NEGATIVE
Spec Grav, UA: 1.01 (ref 1.010–1.025)
Urobilinogen, UA: 0.2 U/dL
pH, UA: 6.5 (ref 5.0–8.0)

## 2024-07-14 MED ORDER — EZETIMIBE 10 MG PO TABS: 10.0000 mg | ORAL_TABLET | Freq: Every day | ORAL | 1 refills | Status: AC

## 2024-07-14 NOTE — Progress Notes (Signed)
 Established patient visit  Patient: Kristen Hays   DOB: August 10, 1946   78 y.o. Female  MRN: [id number] Visit Date: 07/14/2024  Today's healthcare provider: Marnee Sherrard, PA-C   Chief Complaint  Patient presents with   Urinary Frequency    Santina to walk in clinic and was given Macrobid for 5 days last week finished on Monday Last night started to have frequent urination, lower abdomen pain.    Subjective      Discussed the use of AI scribe software for clinical note transcription with the patient, who gave verbal consent to proceed.  History of Present Illness Kristen Hays is a 78 year old female who presents with urinary symptoms following a recent kidney infection.  She experiences significant dysuria, which began after a kidney infection last week. She is completing her medication for the infection, but the pain has returned. There is no flank or back pain, and she has no known kidney stones, although a very small one was noted a few years ago.  She exhibits symptoms of an overactive bladder, including urgency, frequency, and nocturia, waking up three to four times per night. She is not on any medication for overactive bladder. She does not experience urinary incontinence with coughing but has episodes of urgency leading to accidents.  She experiences bloating and discomfort, with a history of constipation. She has tried Miralax, Metamucil, and Linzess, with Linzess providing temporary relief. She acknowledges insufficient water intake, which may contribute to her constipation. She denies smoking and has two children, both delivered via cesarean section.       06/15/2024   11:35 AM 04/19/2024    8:46 AM 02/19/2024    8:15 AM  Depression screen PHQ 2/9  Decreased Interest 0 0 0  Down, Depressed, Hopeless 0 0 0  PHQ - 2 Score 0 0 0  Altered sleeping 0 0 0  Tired, decreased energy 1 0 0  Change in appetite 0 0 0  Feeling bad or failure about yourself  0 0 0  Trouble  concentrating 0 0 0  Moving slowly or fidgety/restless 0 0 0  Suicidal thoughts 0 0 0  PHQ-9 Score 1  0  0   Difficult doing work/chores Not difficult at all Not difficult at all Not difficult at all     Data saved with a previous flowsheet row definition      04/19/2024    8:46 AM 02/19/2024    8:16 AM 10/23/2017    1:29 PM 11/12/2016    4:44 PM  GAD 7 : Generalized Anxiety Score  Nervous, Anxious, on Edge 0 0 3 1  Control/stop worrying 0 0 3 1  Worry too much - different things 0 0 3 1  Trouble relaxing 0 0 0 0  Restless 0 0 0 0  Easily annoyed or irritable 0 0 0 1  Afraid - awful might happen 0 0 0 0  Total GAD 7 Score 0 0 9 4  Anxiety Difficulty Not difficult at all Not difficult at all Somewhat difficult Somewhat difficult    Medications: Outpatient Medications Prior to Visit  Medication Sig   atorvastatin  (LIPITOR) 10 MG tablet Take 1 tablet (10 mg total) by mouth daily.   Cyanocobalamin (VITAMIN B-12 CR) 1000 MCG TBCR Take 1 tablet by mouth daily.   linaclotide (LINZESS) 72 MCG capsule Take 72 mcg by mouth.   pantoprazole (PROTONIX) 40 MG tablet Take 40 mg by mouth 2 (two) times daily before a meal.    [  DISCONTINUED] nitrofurantoin, macrocrystal-monohydrate, (MACROBID) 100 MG capsule Take 100 mg by mouth 2 (two) times daily.   [DISCONTINUED] ciprofloxacin (CIPRO) 500 MG tablet Take 500 mg by mouth 2 (two) times daily. (Patient not taking: Reported on 06/15/2024)   [DISCONTINUED] sertraline  (ZOLOFT ) 100 MG tablet TAKE 1 TABLET BY MOUTH EVERY DAY   No facility-administered medications prior to visit.    Review of Systems All negative Except see HPI       Objective    BP 114/71   Pulse 67   Resp 14   Ht 5' 3 (1.6 m)   Wt 138 lb 1.6 oz (62.6 kg)   SpO2 100%   BMI 24.46 kg/m     Physical Exam Constitutional:      General: She is not in acute distress.    Appearance: Normal appearance.  HENT:     Head: Normocephalic.  Pulmonary:     Effort: Pulmonary  effort is normal. No respiratory distress.  Neurological:     Mental Status: She is alert and oriented to person, place, and time. Mental status is at baseline.      Results for orders placed or performed in visit on 07/14/24  POCT Urinalysis Dipstick  Result Value Ref Range   Color, UA yellow    Clarity, UA clear    Glucose, UA Negative Negative   Bilirubin, UA Negative    Ketones, UA Negative    Spec Grav, UA 1.010 1.010 - 1.025   Blood, UA Trace (A)    pH, UA 6.5 5.0 - 8.0   Protein, UA Negative Negative   Urobilinogen, UA 0.2 0.2 or 1.0 E.U./dL   Nitrite, UA Negative    Leukocytes, UA Negative Negative   Appearance     Odor        Assessment & Plan Dysuria and Hematuria evaluation Recent kidney infection with dysuria and hematuria. Trace blood in urine, no leukocytes. Differential includes kidney stones, UTI, or other causes. Previous small kidney stone noted. - Sent urine for microscopy and culture. - Advised increased water intake. - Consider referral to urology if hematuria persists without UTI.  Overactive bladder Chronic Symptoms of urgency and frequency, possibly exacerbated by recent kidney infection. No current medication. Discussed potential benefits of medication and Kegel exercises. - Consider starting medication for overactive bladder. - Recommended Kegel exercises. - Discuss with urology if symptoms persist. Per chart review, Dr. B advised to consider mirebetriq vs oxybutinin  Constipation Chronic constipation with bloating and discomfort. Previous use of Miralax, Metamucil, and Linzess with variable success. Inadequate water intake noted. - Increase water intake. - Incorporate high fiber diet. - Consider kefir for digestive health. - Documented dietary and lifestyle modifications.  Pt was advised: -Eliminate medications that cause constipation. -Increase fluid intake. -Increase soluble fiber (25-30g/day) in diet. -Encourage regular defecation  attempts after eating. -Regular exercise -Enemas if other methods fail- Bulking agents (accompanied by adequate fluids)  Psyllium  (Konsyl, Metamucil, Perdiem Fiber): 1 tbsp (approx 3.5 grams fiber) in 8-oz liquid PO daily up to TID Pt instructions/high-fiber eating plan were provided    Orders Placed This Encounter  Procedures   Urine Culture   Urine Microscopic   POCT Urinalysis Dipstick    No follow-ups on file.   The patient was advised to call back or seek an in-person evaluation if the symptoms worsen or if the condition fails to improve as anticipated.  I discussed the assessment and treatment plan with the patient. The patient was provided an opportunity  to ask questions and all were answered. The patient agreed with the plan and demonstrated an understanding of the instructions.  I, Elsye Mccollister, PA-C have reviewed all documentation for this visit. The documentation on 07/14/2024  for the exam, diagnosis, procedures, and orders are all accurate and complete.  Jolynn Spencer, [facility], MMS [facility] [phone] (phone) [phone] (fax)  [facility]

## 2024-07-15 ENCOUNTER — Ambulatory Visit: Payer: Self-pay | Admitting: Physician Assistant

## 2024-07-15 LAB — URINALYSIS, MICROSCOPIC ONLY
Bacteria, UA: NONE SEEN
Casts: NONE SEEN /LPF
RBC, Urine: NONE SEEN /HPF (ref 0–2)

## 2024-07-15 LAB — SPECIMEN STATUS REPORT

## 2024-07-16 LAB — SPECIMEN STATUS REPORT

## 2024-07-16 LAB — URINE CULTURE

## 2024-07-19 ENCOUNTER — Encounter: Payer: Self-pay | Admitting: Family Medicine

## 2024-07-19 ENCOUNTER — Ambulatory Visit (INDEPENDENT_AMBULATORY_CARE_PROVIDER_SITE_OTHER): Payer: Self-pay | Admitting: Family Medicine

## 2024-07-19 VITALS — BP 135/75 | HR 72 | Ht 63.0 in | Wt 138.5 lb

## 2024-07-19 DIAGNOSIS — T466X5A Adverse effect of antihyperlipidemic and antiarteriosclerotic drugs, initial encounter: Secondary | ICD-10-CM | POA: Diagnosis not present

## 2024-07-19 DIAGNOSIS — M791 Myalgia, unspecified site: Secondary | ICD-10-CM | POA: Diagnosis not present

## 2024-07-19 DIAGNOSIS — N3281 Overactive bladder: Secondary | ICD-10-CM

## 2024-07-19 DIAGNOSIS — E7800 Pure hypercholesterolemia, unspecified: Secondary | ICD-10-CM | POA: Diagnosis not present

## 2024-07-19 DIAGNOSIS — L659 Nonscarring hair loss, unspecified: Secondary | ICD-10-CM | POA: Diagnosis not present

## 2024-07-19 DIAGNOSIS — F419 Anxiety disorder, unspecified: Secondary | ICD-10-CM

## 2024-07-19 DIAGNOSIS — F325 Major depressive disorder, single episode, in full remission: Secondary | ICD-10-CM

## 2024-07-19 DIAGNOSIS — R739 Hyperglycemia, unspecified: Secondary | ICD-10-CM | POA: Diagnosis not present

## 2024-07-19 MED ORDER — OXYBUTYNIN CHLORIDE ER 5 MG PO TB24: 5.0000 mg | ORAL_TABLET | Freq: Every day | ORAL | 2 refills | Status: AC

## 2024-07-19 NOTE — Assessment & Plan Note (Signed)
 Depression and anxiety are in remission after discontinuation of sertraline . Mood and anxiety are well-managed without medication.

## 2024-07-19 NOTE — Assessment & Plan Note (Signed)
 Cholesterol levels remain elevated despite discontinuation of atorvastatin  due to muscle aches. Currently on Zetia with LDL reduced from 173 to 160. Discussed potential switch to rosuvastatin if needed. - Continue Zetia. - Will recheck cholesterol levels in 6 months. - Will consider rosuvastatin 5 mg daily if cholesterol remains elevated.

## 2024-07-19 NOTE — Progress Notes (Signed)
 Established patient visit   Patient: Kristen Hays   DOB: June 14, 1946   78 y.o. Female  MRN: [id number] Visit Date: 07/19/2024  Today's healthcare provider: Jon Eva, MD   Chief Complaint  Patient presents with   Follow-up   overactive bladder    Patient seen in ofice 07/14/24. Provided urine specimen for testing. Pt advised No Urinary tract infection Most likely symptoms are due to overactive bladder. If you want we could start on a medication for overactive bladder She reports no urinary symptoms but would like to know what she should     Subjective    HPI HPI     overactive bladder    Additional comments: Patient seen in ofice 07/14/24. Provided urine specimen for testing. Pt advised No Urinary tract infection Most likely symptoms are due to overactive bladder. If you want we could start on a medication for overactive bladder She reports no urinary symptoms but would like to know what she should        Last edited by Lilian Fitzpatrick, CMA on 07/19/2024  9:43 AM.       Discussed the use of AI scribe software for clinical note transcription with the patient, who gave verbal consent to proceed.  History of Present Illness   Kristen Hays is a 78 year old female who presents with concerns about overactive bladder and hair loss.  She experiences increased frequency and urgency of urination with an inability to hold urine once the urge arises. She has lower abdominal pain but no dysuria. A recent urinalysis showed no hematuria or infection.  She has significant hair loss for about a month and is concerned it may be related to her thyroid  or liver function. Her thyroid  function was not checked in recent labs, but her kidney and liver functions were normal.  She is taking Zetia for cholesterol management after discontinuing atorvastatin  due to muscle aches. She has not tried any other statins. She recently stopped taking Zoloft  and has stable mood and  anxiety levels without it.          06/15/2024   11:35 AM 04/19/2024    8:46 AM 02/19/2024    8:15 AM 06/09/2023    9:16 AM 01/08/2023   11:04 AM  Depression screen PHQ 2/9  Decreased Interest 0 0 0 0 0  Down, Depressed, Hopeless 0 0 0 0 0  PHQ - 2 Score 0 0 0 0 0  Altered sleeping 0 0 0  0  Tired, decreased energy 1 0 0  0  Change in appetite 0 0 0  0  Feeling bad or failure about yourself  0 0 0  0  Trouble concentrating 0 0 0  0  Moving slowly or fidgety/restless 0 0 0  0  Suicidal thoughts 0 0 0  0  PHQ-9 Score 1  0  0   0   Difficult doing work/chores Not difficult at all Not difficult at all Not difficult at all  Not difficult at all     Data saved with a previous flowsheet row definition       04/19/2024    8:46 AM 02/19/2024    8:16 AM 10/23/2017    1:29 PM 11/12/2016    4:44 PM  GAD 7 : Generalized Anxiety Score  Nervous, Anxious, on Edge 0 0 3 1  Control/stop worrying 0 0 3 1  Worry too much - different things 0 0 3 1  Trouble relaxing  0 0 0 0  Restless 0 0 0 0  Easily annoyed or irritable 0 0 0 1  Afraid - awful might happen 0 0 0 0  Total GAD 7 Score 0 0 9 4  Anxiety Difficulty Not difficult at all Not difficult at all Somewhat difficult Somewhat difficult     Medications: Outpatient Medications Prior to Visit  Medication Sig   Cyanocobalamin (VITAMIN B-12 CR) 1000 MCG TBCR Take 1 tablet by mouth daily.   ezetimibe (ZETIA) 10 MG tablet Take 1 tablet (10 mg total) by mouth daily.   linaclotide (LINZESS) 72 MCG capsule Take 72 mcg by mouth.   pantoprazole (PROTONIX) 40 MG tablet Take 40 mg by mouth 2 (two) times daily before a meal.    [DISCONTINUED] atorvastatin  (LIPITOR) 10 MG tablet Take 1 tablet (10 mg total) by mouth daily.   No facility-administered medications prior to visit.    Review of Systems     Objective    BP 135/75 (BP Location: Left Arm, Patient Position: Sitting, Cuff Size: Normal)   Pulse 72   Ht 5' 3 (1.6 m)   Wt 138 lb 8 oz  (62.8 kg)   SpO2 100%   BMI 24.53 kg/m    Physical Exam Vitals reviewed.  Constitutional:      General: She is not in acute distress.    Appearance: Normal appearance. She is well-developed. She is not diaphoretic.  HENT:     Head: Normocephalic and atraumatic.  Eyes:     General: No scleral icterus.    Conjunctiva/sclera: Conjunctivae normal.  Neck:     Thyroid : No thyromegaly.  Cardiovascular:     Rate and Rhythm: Normal rate and regular rhythm.     Heart sounds: Normal heart sounds. No murmur heard. Pulmonary:     Effort: Pulmonary effort is normal. No respiratory distress.     Breath sounds: Normal breath sounds. No wheezing, rhonchi or rales.  Musculoskeletal:     Cervical back: Neck supple.     Right lower leg: No edema.     Left lower leg: No edema.  Lymphadenopathy:     Cervical: No cervical adenopathy.  Skin:    General: Skin is warm and dry.     Findings: No rash.  Neurological:     Mental Status: She is alert and oriented to person, place, and time. Mental status is at baseline.  Psychiatric:        Mood and Affect: Mood normal.        Behavior: Behavior normal.      No results found for any visits on 07/19/24.  Assessment & Plan     Problem List Items Addressed This Visit       Genitourinary   Overactive bladder - Primary   Increased frequency and urgency of urination without burning sensation. Urinalysis showed no infection or blood, indicating overactive bladder rather than infection. - Prescribed oxybutynin 5 mg at bedtime for 30 days with refills. - Discussed potential side effects of oxybutynin, including drowsiness, dry mouth, and increased risk of falls. - Will consider alternative medications like Myrbetriq or Gemteza if side effects occur.        Other   Anxiety   Depression and anxiety are in remission after discontinuation of sertraline . Mood and anxiety are well-managed without medication.      Hypercholesterolemia   Cholesterol  levels remain elevated despite discontinuation of atorvastatin  due to muscle aches. Currently on Zetia with LDL reduced from 173 to 160.  Discussed potential switch to rosuvastatin if needed. - Continue Zetia. - Will recheck cholesterol levels in 6 months. - Will consider rosuvastatin 5 mg daily if cholesterol remains elevated.      MDD (major depressive disorder), single episode, in full remission   Depression and anxiety are in remission after discontinuation of sertraline . Mood and anxiety are well-managed without medication.      Myalgia due to statin   Other Visit Diagnoses       Hair loss       Relevant Orders   TSH   Hemoglobin A1c     Hyperglycemia       Relevant Orders   Hemoglobin A1c           Nonscarring hair loss Recent onset of hair loss, possibly related to thyroid  dysfunction or hyperglycemia. Previous labs did not include thyroid  function tests. - Ordered thyroid  function tests. - Checked blood sugar levels.  Hyperglycemia, unspecified Blood sugar levels were borderline high, possibly contributing to hair loss. Recent labs were not fasting, which may affect results. - Checked fasting blood sugar levels.  General Health Maintenance Discussed COVID-19 vaccination recommendation, especially for those over 65. Tetanus vaccination status is up to date. - Recommended COVID-19 vaccination at a pharmacy. - Confirmed tetanus vaccination status is up to date.       Return in about 6 months (around 01/16/2025) for CPE.       Jon Eva, MD  [facility] [phone] (phone) [phone] (fax)  [facility]

## 2024-07-19 NOTE — Assessment & Plan Note (Signed)
 Increased frequency and urgency of urination without burning sensation. Urinalysis showed no infection or blood, indicating overactive bladder rather than infection. - Prescribed oxybutynin 5 mg at bedtime for 30 days with refills. - Discussed potential side effects of oxybutynin, including drowsiness, dry mouth, and increased risk of falls. - Will consider alternative medications like Myrbetriq or Gemteza if side effects occur.

## 2024-07-20 ENCOUNTER — Ambulatory Visit: Payer: Self-pay | Admitting: Family Medicine

## 2024-07-20 LAB — HEMOGLOBIN A1C
Est. average glucose Bld gHb Est-mCnc: 114 mg/dL
Hgb A1c MFr Bld: 5.6 % (ref 4.8–5.6)

## 2024-07-20 LAB — TSH: TSH: 0.958 u[IU]/mL (ref 0.450–4.500)

## 2024-08-09 DIAGNOSIS — R140 Abdominal distension (gaseous): Secondary | ICD-10-CM | POA: Diagnosis not present

## 2024-08-09 DIAGNOSIS — K581 Irritable bowel syndrome with constipation: Secondary | ICD-10-CM | POA: Diagnosis not present

## 2024-08-30 ENCOUNTER — Encounter: Payer: Self-pay | Admitting: Family Medicine

## 2024-08-30 ENCOUNTER — Ambulatory Visit (INDEPENDENT_AMBULATORY_CARE_PROVIDER_SITE_OTHER): Admitting: Family Medicine

## 2024-08-30 VITALS — BP 110/64 | HR 79 | Temp 98.1°F | Ht 63.0 in | Wt 134.4 lb

## 2024-08-30 DIAGNOSIS — J069 Acute upper respiratory infection, unspecified: Secondary | ICD-10-CM | POA: Diagnosis not present

## 2024-08-30 DIAGNOSIS — R509 Fever, unspecified: Secondary | ICD-10-CM

## 2024-08-30 LAB — POCT INFLUENZA A/B
Influenza A, POC: NEGATIVE
Influenza B, POC: NEGATIVE

## 2024-08-30 LAB — POC COVID19 BINAXNOW: SARS Coronavirus 2 Ag: NEGATIVE

## 2024-08-30 MED ORDER — BENZONATATE 200 MG PO CAPS: 200.0000 mg | ORAL_CAPSULE | Freq: Three times a day (TID) | ORAL | 1 refills | Status: AC | PRN

## 2024-08-30 NOTE — Patient Instructions (Addendum)
 Drink fluids and rest  mucinex DM is good for cough and congestion  Nasal saline for congestion as needed  Tylenol  for fever or pain or headache  Tessalon (prescription) for cough also  Please alert us  if symptoms worsen (if severe or short of breath please go to the ER)    If you feel tight (chest) or wheezy-give us  a call    Covid and flu tests are negative

## 2024-08-30 NOTE — Assessment & Plan Note (Signed)
 Day 3  Low grade temp at home controlled with tylenol , congestion and cough  Reassuring exam  Flu and covid tests negative  Disc symptomatic care - see instructions on AVS Sent benzonatate to pharmacy  Suggested mucinex dm   Update if not starting to improve in a week or if worsening  Call back and Er precautions noted in detail today

## 2024-08-30 NOTE — Progress Notes (Signed)
 "  Subjective:    Patient ID: Kristen Hays, female    DOB: 19-Dec-1945, 78 y.o.   MRN: [id number]  HPI  Wt Readings from Last 3 Encounters:  08/30/24 134 lb 6 oz (61 kg)  07/19/24 138 lb 8 oz (62.8 kg)  07/14/24 138 lb 1.6 oz (62.6 kg)   23.80 kg/m  Vitals:   08/30/24 1455  BP: 110/64  Pulse: 79  Temp: 98.1 F (36.7 C)  SpO2: 98%   78 yo pt of Dr Myrla presents with  Cough and congestion  No fever today   Temp of 100.4 yesterday with aches and chills and exhaustion   Did have her flu shot this season    Started Sunday   Lot of cough - some phlegm- clear  No wheezing  No shortness of breath  Headache - whole head  Ears feel full  Throat has not been sore    Over the counter Day quil Ny quil   Tylenol  for headache   No nasal sprays    Results for orders placed or performed in visit on 08/30/24  POCT Influenza A/B   Collection Time: 08/30/24  6:14 PM  Result Value Ref Range   Influenza A, POC Negative Negative   Influenza B, POC Negative Negative  POC COVID-19 BinaxNow   Collection Time: 08/30/24  6:15 PM  Result Value Ref Range   SARS Coronavirus 2 Ag Negative Negative       Patient Active Problem List   Diagnosis Date Noted   Viral URI with cough 08/30/2024   Myalgia due to statin 07/19/2024   Intertrigo 01/08/2023   Overactive bladder 12/09/2022   Closed fracture of lumbar vertebra (HCC) 06/02/2022   Degeneration of lumbar intervertebral disc 06/02/2022   Bilateral carpal tunnel syndrome 06/04/2021   Weight gain 01/01/2021   MDD (major depressive disorder), single episode, in full remission 01/01/2021   History of reconstruction of right breast 01/13/2020   Chronic constipation 12/17/2017   Other dysphagia 12/17/2017   History of cancer of right breast 01/08/2017   Acquired absence of right breast and nipple 01/08/2017   Tarsal tunnel syndrome of right side 06/21/2015   Anxiety 05/18/2015   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    Hypercholesterolemia 05/18/2015   Other somatoform disorders 05/18/2015   Diaphragmatic hernia without obstruction or gangrene 05/18/2015   Hematuria 05/18/2015   Malignant neoplasm of female breast (HCC) 05/18/2015   Neck pain 05/18/2015   Other diseases of pharynx 05/18/2015   Low back pain 05/18/2015   Barrett esophagus 03/04/2015   H/O adenomatous polyp of colon 03/04/2015   Barrett's esophagus without dysplasia 03/04/2015   Past Medical History:  Diagnosis Date   Anxiety    at times not often   Barrett esophagus 2016   Breast cancer (HCC)    right breast c Mastectomy    Cataract had removed   Digital mucinous cyst of finger 06/12/2022   Dysphagia    GERD (gastroesophageal reflux disease)    High cholesterol    Hypothyroidism    Sleep apnea use c pap   Past Surgical History:  Procedure Laterality Date   AUGMENTATION MAMMAPLASTY Bilateral    BREAST SURGERY  2010   mastectomy-right; breast augmentation   CATARACT EXTRACTION W/PHACO Left 07/21/2016   Procedure: CATARACT EXTRACTION PHACO AND INTRAOCULAR LENS PLACEMENT (IOC);  Surgeon: Donzell Arlyce Budd, MD;  Location: [facility];  Service: Ophthalmology;  Laterality: Left;  LEFT   CESAREAN SECTION  [id number]   1974/1979  COLONOSCOPY N/A 01/17/2015   Procedure: COLONOSCOPY;  Surgeon: Lamar ONEIDA Holmes, MD;  Location: [facility] END[facility]COPY;  Service: Endoscopy;  Laterality: N/A;   ESOPHAGOGASTRODUODENOSCOPY N/A 01/17/2015   Procedure: ESOPHAGOGASTRODUODENOSCOPY (EGD);  Surgeon: Lamar ONEIDA Holmes, MD;  Location: [facility] ENDOSCOPY;  Service: Endoscopy;  Laterality: N/A;   ESOPHAGOGASTRODUODENOSCOPY (EGD) WITH PROPOFOL  N/A 02/17/2018   Procedure: ESOPHAGOGASTRODUODENOSCOPY (EGD) WITH PROPOFOL ;  Surgeon: Holmes Lamar ONEIDA, MD;  Location: [facility] ENDOSCOPY;  Service: Endoscopy;  Laterality: N/A;   EYE SURGERY Right 08/2014   MASTECTOMY Right 2011   SAVORY DILATION N/A 01/17/2015   Procedure: SAVORY DILATION;  Surgeon: Lamar ONEIDA Holmes, MD;  Location: [facility] ENDOSCOPY;  Service: Endoscopy;  Laterality: N/A;   TONSILLECTOMY  2011   Social History[1] Family History  Problem Relation Age of Onset   Diabetes Mother    Stroke Mother    Alzheimer's disease Mother    Bone cancer Father    Vascular Disease Father    Cancer Sister        breast   Breast cancer Sister    Irritable bowel syndrome Sister    Diabetes Sister    Healthy Brother    Healthy Sister    Breast cancer Paternal Aunt    Breast cancer Cousin    Allergies[2] Medications Ordered Prior to Encounter[2]  Review of Systems  Constitutional:  Positive for appetite change, fatigue and fever.  HENT:  Positive for congestion, postnasal drip, rhinorrhea, sinus pressure, sneezing and sore throat. Negative for ear pain.   Eyes:  Negative for pain and discharge.  Respiratory:  Positive for cough. Negative for shortness of breath, wheezing and stridor.   Cardiovascular:  Negative for chest pain.  Gastrointestinal:  Negative for diarrhea, nausea and vomiting.  Genitourinary:  Negative for frequency, hematuria and urgency.  Musculoskeletal:  Negative for arthralgias and myalgias.  Skin:  Negative for rash.  Neurological:  Positive for headaches. Negative for dizziness, weakness and light-headedness.  Psychiatric/Behavioral:  Negative for confusion and dysphoric mood.        Objective:   Physical Exam Constitutional:      General: She is not in acute distress.    Appearance: Normal appearance. She is well-developed and normal weight. She is not ill-appearing, toxic-appearing or diaphoretic.  HENT:     Head: Normocephalic and atraumatic.     Comments: Nares are injected and congested    No facial tenderness    Right Ear: Tympanic membrane, ear canal and external ear normal.     Left Ear: Tympanic membrane, ear canal and external ear normal.     Nose: Congestion and rhinorrhea present.     Mouth/Throat:     Mouth: Mucous membranes are moist.      Pharynx: Oropharynx is clear. No oropharyngeal exudate or posterior oropharyngeal erythema.     Comments: Clear pnd  Eyes:     General:        Right eye: No discharge.        Left eye: No discharge.     Conjunctiva/sclera: Conjunctivae normal.     Pupils: Pupils are equal, round, and reactive to light.  Cardiovascular:     Rate and Rhythm: Normal rate.     Heart sounds: Normal heart sounds.  Pulmonary:     Effort: Pulmonary effort is normal. No respiratory distress.     Breath sounds: No stridor. Rhonchi present. No wheezing or rales.     Comments: Few scattered rhonchi  Upper airway sounds cleared by cough   No  rales or wheezes  Chest:     Chest wall: No tenderness.  Musculoskeletal:     Cervical back: Normal range of motion and neck supple.  Lymphadenopathy:     Cervical: No cervical adenopathy.  Skin:    General: Skin is warm and dry.     Capillary Refill: Capillary refill takes less than 2 seconds.     Findings: No rash.  Neurological:     Mental Status: She is alert.     Cranial Nerves: No cranial nerve deficit.  Psychiatric:        Mood and Affect: Mood normal.           Assessment & Plan:   Problem List Items Addressed This Visit       Respiratory   Viral URI with cough   Day 3  Low grade temp at home controlled with tylenol , congestion and cough  Reassuring exam  Flu and covid tests negative  Disc symptomatic care - see instructions on AVS Sent benzonatate to pharmacy  Suggested mucinex dm   Update if not starting to improve in a week or if worsening  Call back and Er precautions noted in detail today        Other Visit Diagnoses       Fever, unspecified fever cause    -  Primary   Relevant Orders   POC COVID-19 BinaxNow   POCT Influenza A/B         [1]  Social History Tobacco Use   Smoking status: Never   Smokeless tobacco: Never  Vaping Use   Vaping status: Never Used  Substance Use Topics   Alcohol use: No   Drug use: No   [2] No Known Allergies [2]  Current Outpatient Medications on File Prior to Visit  Medication Sig Dispense Refill   Cyanocobalamin (VITAMIN B-12 CR) 1000 MCG TBCR Take 1 tablet by mouth daily.     ezetimibe  (ZETIA ) 10 MG tablet Take 1 tablet (10 mg total) by mouth daily. 90 tablet 1   oxybutynin  (DITROPAN -XL) 5 MG 24 hr tablet Take 1 tablet (5 mg total) by mouth at bedtime. 30 tablet 2   pantoprazole (PROTONIX) 40 MG tablet Take 40 mg by mouth 2 (two) times daily before a meal.      linaclotide (LINZESS) 72 MCG capsule Take 72 mcg by mouth. (Patient not taking: Reported on 08/30/2024)     No current facility-administered medications on file prior to visit.   "

## 2024-10-01 ENCOUNTER — Other Ambulatory Visit: Payer: Self-pay | Admitting: Family Medicine

## 2025-01-17 ENCOUNTER — Encounter: Admitting: Family Medicine

## 2025-06-21 ENCOUNTER — Ambulatory Visit
